# Patient Record
Sex: Female | Born: 1935 | Race: Black or African American | Hispanic: No | Smoking: Never smoker
Health system: Southern US, Community
[De-identification: ages and names within clinical notes are randomized; demographics above are authoritative.]

## PROBLEM LIST (undated history)

## (undated) ENCOUNTER — Emergency Department (HOSPITAL_COMMUNITY): Admission: EM | Payer: Self-pay

---

## 2015-12-01 DIAGNOSIS — E782 Mixed hyperlipidemia: Secondary | ICD-10-CM

## 2015-12-01 DIAGNOSIS — I10 Essential (primary) hypertension: Secondary | ICD-10-CM

## 2015-12-01 HISTORY — DX: Essential (primary) hypertension: I10

## 2015-12-01 HISTORY — DX: Mixed hyperlipidemia: E78.2

## 2016-04-16 DIAGNOSIS — K219 Gastro-esophageal reflux disease without esophagitis: Secondary | ICD-10-CM | POA: Diagnosis present

## 2021-07-19 ENCOUNTER — Encounter (HOSPITAL_COMMUNITY): Payer: Self-pay

## 2021-07-19 ENCOUNTER — Inpatient Hospital Stay (HOSPITAL_COMMUNITY)
Admission: EM | Admit: 2021-07-19 | Discharge: 2021-07-24 | DRG: 065 | Disposition: A | Discharge status: Rehabilitation Facility | Payer: Medicare Other | Attending: Internal Medicine | Admitting: Internal Medicine

## 2021-07-19 ENCOUNTER — Emergency Department (HOSPITAL_COMMUNITY): Payer: Medicare Other

## 2021-07-19 DIAGNOSIS — G8191 Hemiplegia, unspecified affecting right dominant side: Secondary | ICD-10-CM | POA: Diagnosis present

## 2021-07-19 DIAGNOSIS — K219 Gastro-esophageal reflux disease without esophagitis: Secondary | ICD-10-CM | POA: Diagnosis present

## 2021-07-19 DIAGNOSIS — I639 Cerebral infarction, unspecified: Secondary | ICD-10-CM | POA: Diagnosis not present

## 2021-07-19 DIAGNOSIS — N1832 Chronic kidney disease, stage 3b: Secondary | ICD-10-CM | POA: Diagnosis present

## 2021-07-19 DIAGNOSIS — R29706 NIHSS score 6: Secondary | ICD-10-CM | POA: Diagnosis present

## 2021-07-19 DIAGNOSIS — I10 Essential (primary) hypertension: Secondary | ICD-10-CM | POA: Diagnosis present

## 2021-07-19 DIAGNOSIS — E782 Mixed hyperlipidemia: Secondary | ICD-10-CM | POA: Diagnosis present

## 2021-07-19 DIAGNOSIS — R29810 Facial weakness: Secondary | ICD-10-CM | POA: Diagnosis present

## 2021-07-19 DIAGNOSIS — H539 Unspecified visual disturbance: Secondary | ICD-10-CM | POA: Diagnosis present

## 2021-07-19 DIAGNOSIS — I63512 Cerebral infarction due to unspecified occlusion or stenosis of left middle cerebral artery: Secondary | ICD-10-CM | POA: Diagnosis not present

## 2021-07-19 DIAGNOSIS — Z79899 Other long term (current) drug therapy: Secondary | ICD-10-CM

## 2021-07-19 DIAGNOSIS — Z6838 Body mass index (BMI) 38.0-38.9, adult: Secondary | ICD-10-CM

## 2021-07-19 DIAGNOSIS — Z888 Allergy status to other drugs, medicaments and biological substances status: Secondary | ICD-10-CM

## 2021-07-19 DIAGNOSIS — Z880 Allergy status to penicillin: Secondary | ICD-10-CM

## 2021-07-19 DIAGNOSIS — I130 Hypertensive heart and chronic kidney disease with heart failure and stage 1 through stage 4 chronic kidney disease, or unspecified chronic kidney disease: Secondary | ICD-10-CM | POA: Diagnosis present

## 2021-07-19 DIAGNOSIS — E669 Obesity, unspecified: Secondary | ICD-10-CM | POA: Diagnosis present

## 2021-07-19 DIAGNOSIS — I5032 Chronic diastolic (congestive) heart failure: Secondary | ICD-10-CM | POA: Diagnosis present

## 2021-07-19 DIAGNOSIS — Z20822 Contact with and (suspected) exposure to covid-19: Secondary | ICD-10-CM | POA: Diagnosis present

## 2021-07-19 LAB — I-STAT CHEM 8, ED
BUN: 30 mg/dL — ABNORMAL HIGH (ref 8–23)
Calcium, Ion: 1.04 mmol/L — ABNORMAL LOW (ref 1.15–1.40)
Chloride: 107 mmol/L (ref 98–111)
Creatinine, Ser: 1.6 mg/dL — ABNORMAL HIGH (ref 0.44–1.00)
Glucose, Bld: 125 mg/dL — ABNORMAL HIGH (ref 70–99)
HCT: 41 % (ref 36.0–46.0)
Hemoglobin: 13.9 g/dL (ref 12.0–15.0)
Potassium: 4.6 mmol/L (ref 3.5–5.1)
Sodium: 139 mmol/L (ref 135–145)
TCO2: 26 mmol/L (ref 22–32)

## 2021-07-19 IMAGING — CT CT HEAD CODE STROKE
4 series · 16 of 47 positions shown, 18 images · non-contrast
Comparison: None.

CLINICAL DATA: Code stroke.

EXAM:
CT HEAD WITHOUT CONTRAST
TECHNIQUE: Contiguous axial images were obtained from the base of the skull
through the vertex without intravenous contrast.

[Series 2: head wo · axial · 0.36mm/px · z∈[+1240,+1344]mm · 7 of 29 slices shown, 9 images]
[im 4/29  brain]
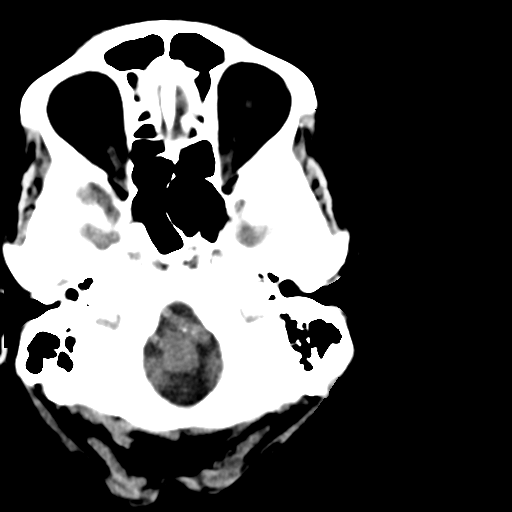
[im 4/29  bone]
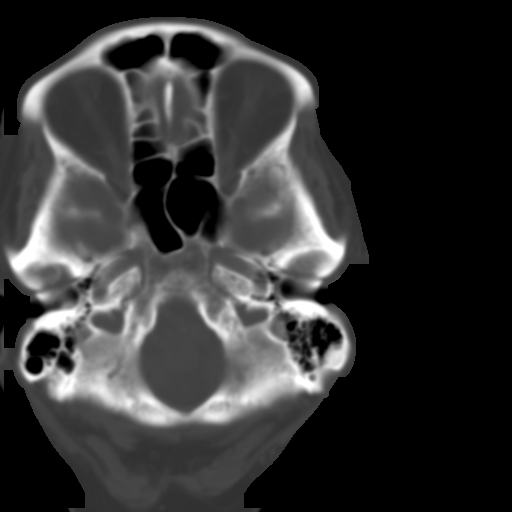
[im 8/29  brain]
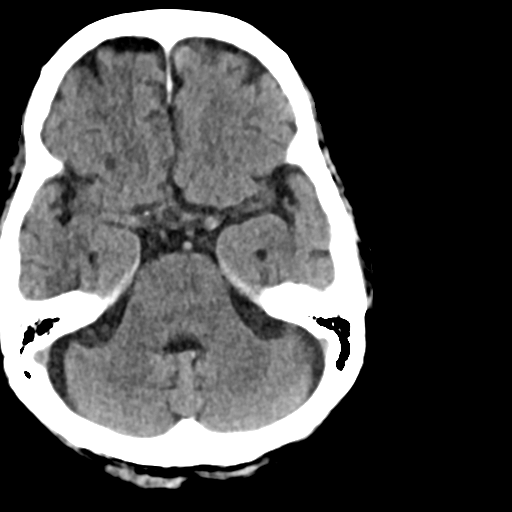
[im 11/29  brain]
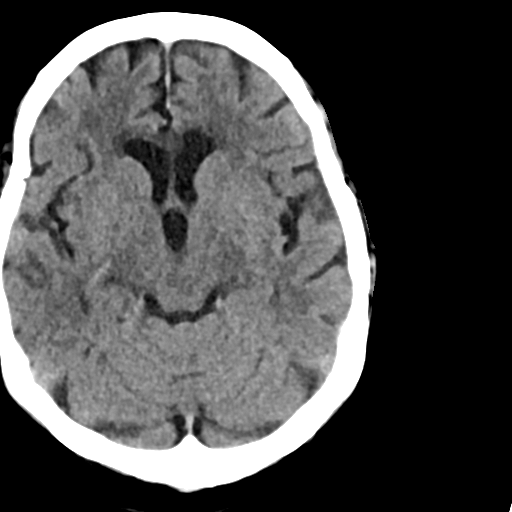
[im 15/29  brain]
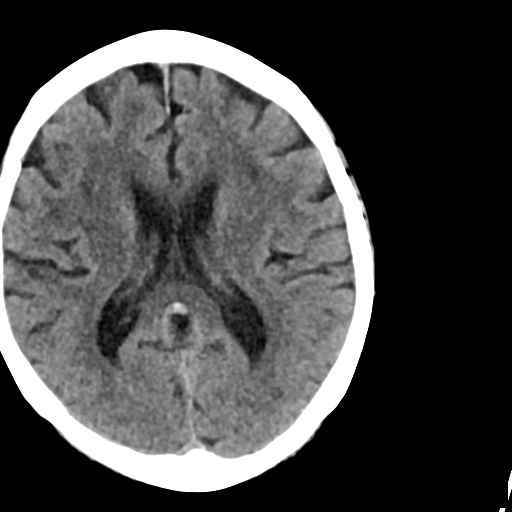
[im 18/29  brain]
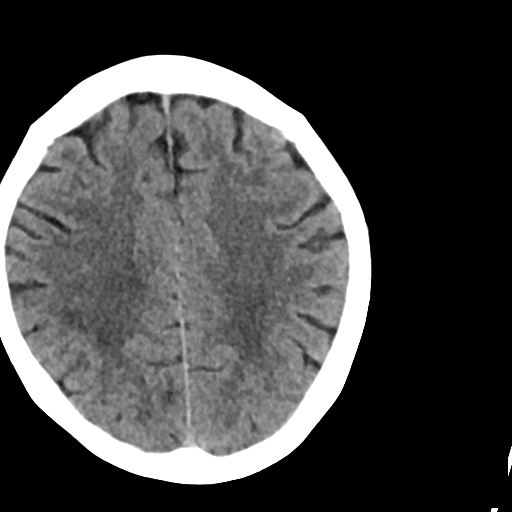
[im 18/29  bone]
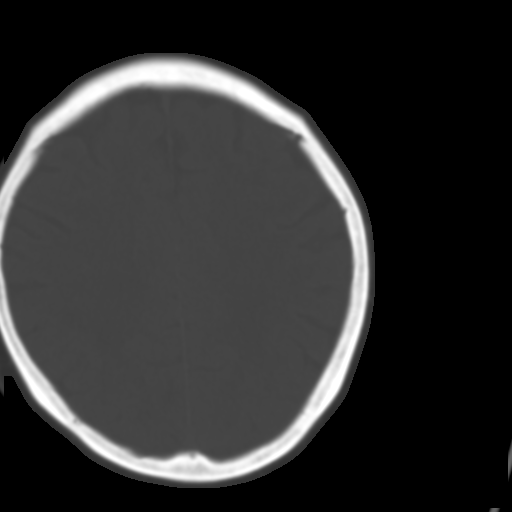
[im 22/29  brain]
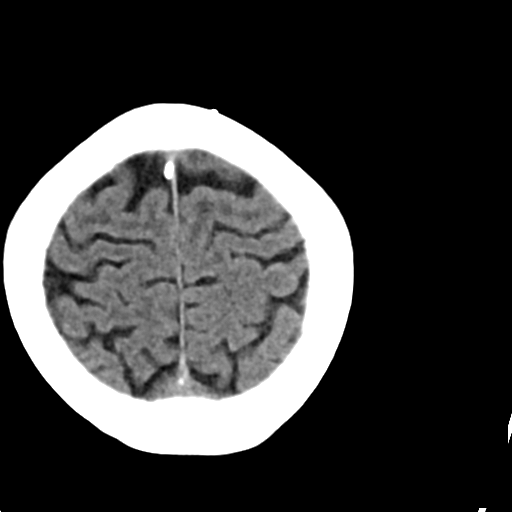
[im 25/29  brain]
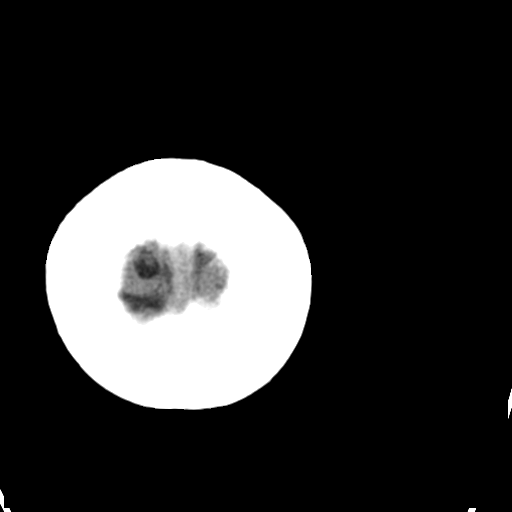

[Series 4: head bone · axial · 0.36mm/px · z∈[+1238,+1266]mm · 3 of 72 slices shown]
[im 8/72  bone]
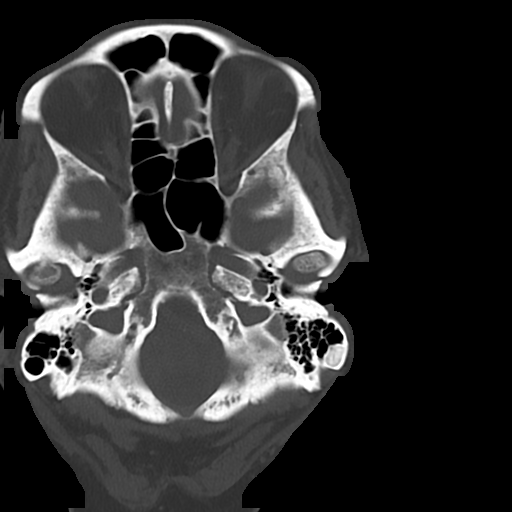
[im 15/72  bone]
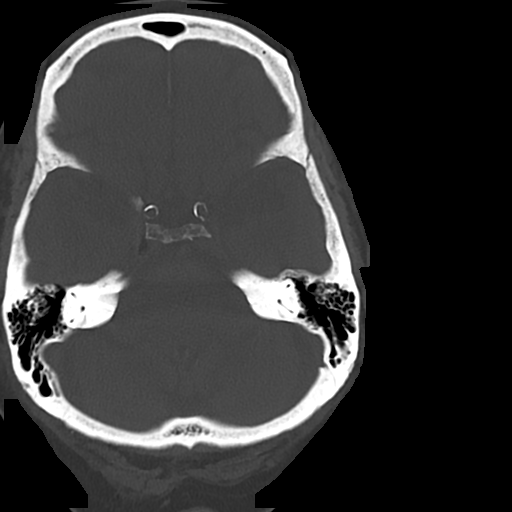
[im 22/72  bone]
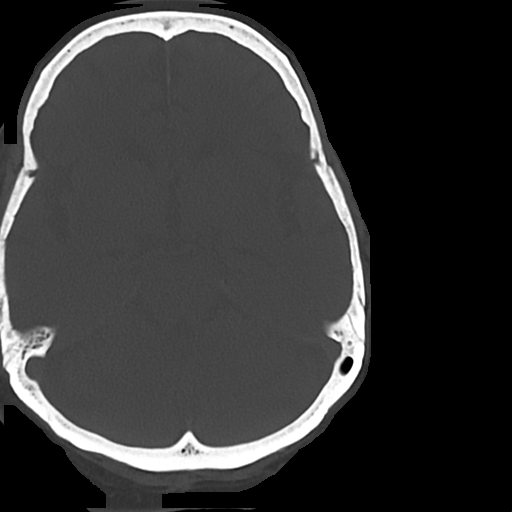

[Series 5: cor soft · coronal · 0.29mm/px · 3 of 62 slices shown]
[im 21/62  brain]
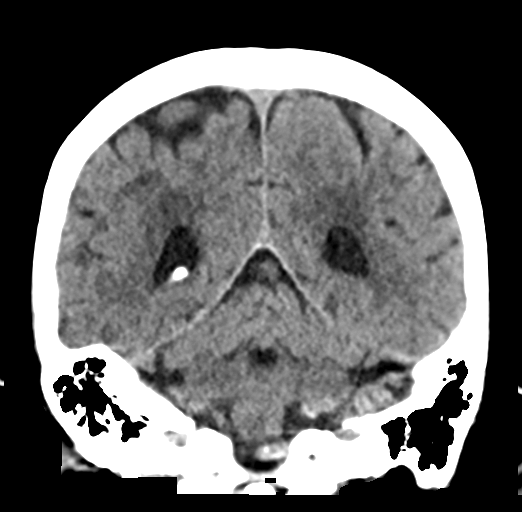
[im 28/62  brain]
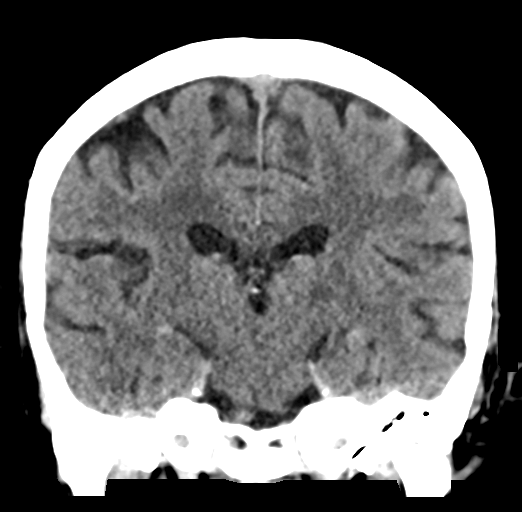
[im 34/62  brain]
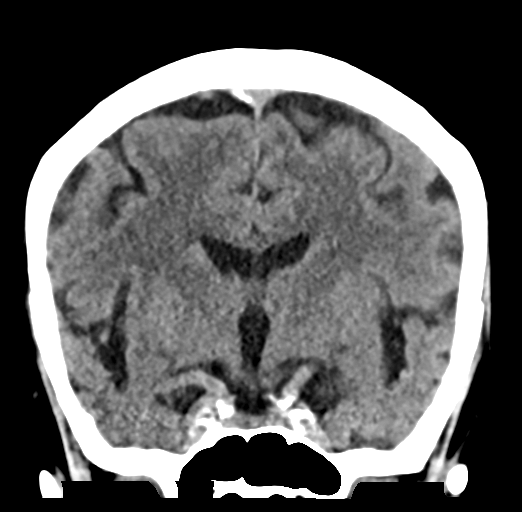

[Series 6: sag soft · sagittal · 0.29mm/px · 3 of 60 slices shown]
[im 20/60  brain]
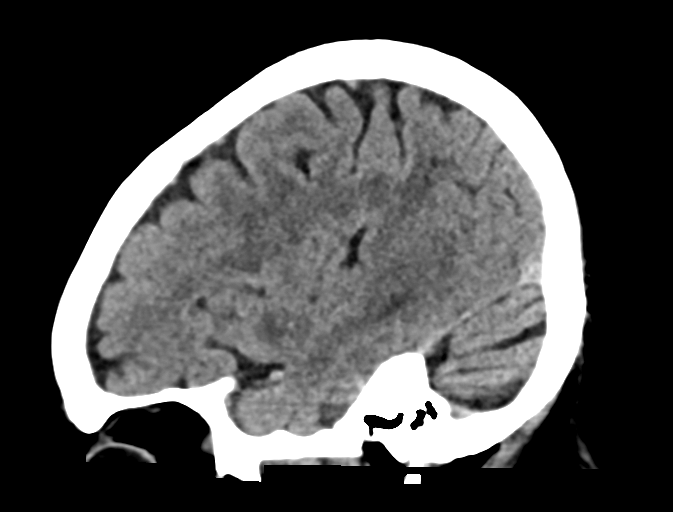
[im 30/60  brain]
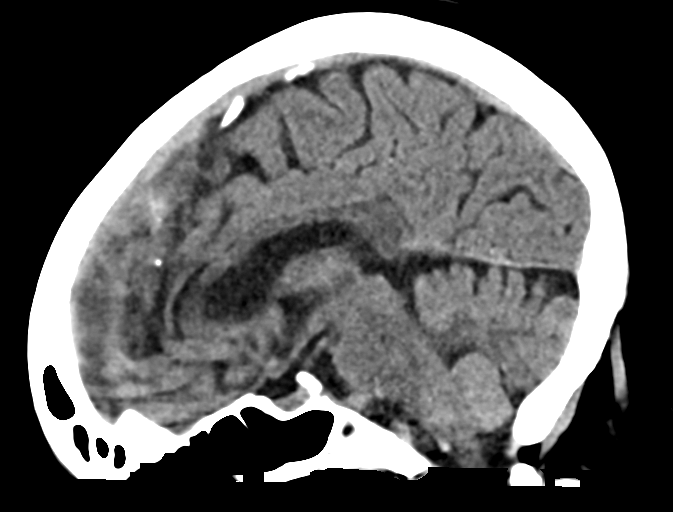
[im 40/60  brain]
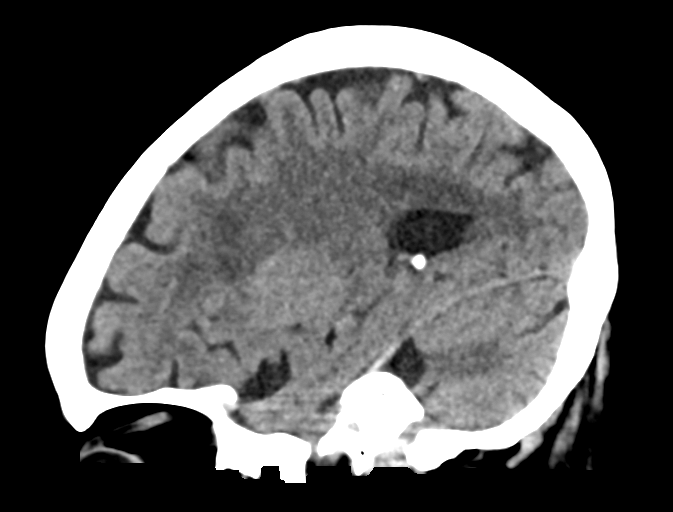

[16 of 47 positions shown; findings below may reference images not displayed]

FINDINGS: Brain: Hypodensity in the left lateral thalamus, of indeterminate
acuity, possibly acute or subacute infarct. No acute hemorrhage,
hydrocephalus, extra-axial collection, mass, mass effect, or midline
shift. Periventricular white matter changes, likely the sequela of
chronic small vessel ischemic disease.

Vascular: No hyperdense vessel.

Skull: Normal. Negative for fracture or focal lesion.

Sinuses/Orbits: No acute finding.

Other: The mastoids are well aerated.

ASPECTS ([place] Stroke Program Early CT Score)

- Ganglionic level infarction (caudate, lentiform nuclei, internal
capsule, insula, M1-M3 cortex): 7

- Supraganglionic infarction (M4-M6 cortex): 3

Total score (0-10 with 10 being normal): 9
IMPRESSION: 1. Hypodensity in the left lateral thalamus, of indeterminate
acuity, but possibly representing an acute or subacute infarct.
2. ASPECTS is 10

pm to provider DEELUN via secure text paging.

## 2021-07-19 MED ORDER — ASPIRIN 325 MG PO TABS
325.0000 mg | ORAL_TABLET | Freq: Once | ORAL | Status: AC
  Administered 2021-07-19: 325 mg via ORAL
  Filled 2021-07-19: qty 1

## 2021-07-19 NOTE — ED Triage Notes (Signed)
Pt comes via [facility], LSN 9am today, began to have R sided weakness of the arm and leg and unable to see out of the R eye, gradually got worse throughout the day.

## 2021-07-19 NOTE — ED Provider Notes (Signed)
[facility] EMERGENCY DEPARTMENT Provider Note   CSN: [id number] Arrival date & time: 07/19/21  2304  An emergency department physician performed an initial assessment on this suspected stroke patient at 2306.  History Chief Complaint  Patient presents with   Code Stroke    Diane Webb is a 87 y.o. female.  The history is provided by the patient.  Weakness Severity:  Moderate Onset quality:  Gradual Duration:  14 hours Timing:  Constant Progression:  Worsening Chronicity:  New Relieved by:  Nothing Worsened by:  Nothing Associated symptoms: difficulty walking   Associated symptoms: no abdominal pain, no chest pain, no fever, no loss of consciousness and no vomiting   Patient reports that approximately 9 AM she began having right-sided weakness in the arm and leg.  She also reports some vision difficulties.  She reports some numbness to the right side of her face.  She reports she is having difficulty walking.  It worsened throughout the day.  She does report mild headache.  No fevers or vomiting.  No chest pain or abdominal pain.     PMH-unknown Soc hx - unknown OB History   No obstetric history on file.     No family history on file.     Home Medications Prior to Admission medications   Not on File    Allergies    Patient has no known allergies.  Review of Systems   Review of Systems  Constitutional:  Negative for fever.  Eyes:  Positive for visual disturbance.  Cardiovascular:  Negative for chest pain.  Gastrointestinal:  Negative for abdominal pain and vomiting.  Neurological:  Positive for weakness and numbness. Negative for loss of consciousness.  All other systems reviewed and are negative.  Physical Exam Updated Vital Signs BP (!) 185/68   Pulse 94   Temp 98 F (36.7 C) (Oral)   Resp (!) 27   SpO2 100%   Physical Exam CONSTITUTIONAL: Elderly, no acute distress HEAD: Normocephalic/atraumatic EYES: EOMI ENMT: Mucous  membranes moist NECK: supple no meningeal signs SPINE/BACK:entire spine nontender CV: S1/S2 noted, no murmurs/rubs/gallops noted LUNGS: Lungs are clear to auscultation bilaterally, no apparent distress ABDOMEN: soft, nontender GU:no cva tenderness NEURO: Pt is awake/alert. ?Mild dysarthria.  There is right arm and right leg drift.  She reports sensory deficit to the right face. NIHSS 6 per neurology EXTREMITIES: pulses normal/equalx4, full ROM SKIN: warm, color normal PSYCH: no abnormalities of mood noted  ED Results / Procedures / Treatments   Labs (all labs ordered are listed, but only abnormal results are displayed) Labs Reviewed  I-STAT CHEM 8, ED - Abnormal; Notable for the following components:      Result Value   BUN 30 (*)    Creatinine, Ser 1.60 (*)    Glucose, Bld 125 (*)    Calcium, Ion 1.04 (*)    All other components within normal limits  RESP PANEL BY RT-PCR (FLU A&B, COVID) ARPGX2  ETHANOL  PROTIME-INR  APTT  CBC  DIFFERENTIAL  COMPREHENSIVE METABOLIC PANEL  RAPID URINE DRUG SCREEN, HOSP PERFORMED  URINALYSIS, ROUTINE W REFLEX MICROSCOPIC    EKG EKG Interpretation  Date/Time:  Wednesday July 19 2021 23:26:15 EDT Ventricular Rate:  88 PR Interval:  150 QRS Duration: 77 QT Interval:  343 QTC Calculation: 415 R Axis:   31 Text Interpretation: Sinus rhythm Nonspecific T abnormalities, inferior leads No previous ECGs available Confirmed by Zadie Rhine ([id number]) on 07/19/2021 11:40:38 PM  Radiology CT HEAD CODE STROKE  WO CONTRAST  Result Date: 07/19/2021 CLINICAL DATA:  Code stroke. EXAM: CT HEAD WITHOUT CONTRAST TECHNIQUE: Contiguous axial images were obtained from the base of the skull through the vertex without intravenous contrast. COMPARISON:  None. FINDINGS: Brain: Hypodensity in the left lateral thalamus, of indeterminate acuity, possibly acute or subacute infarct. No acute hemorrhage, hydrocephalus, extra-axial collection, mass, mass effect,  or midline shift. Periventricular white matter changes, likely the sequela of chronic small vessel ischemic disease. Vascular: No hyperdense vessel. Skull: Normal. Negative for fracture or focal lesion. Sinuses/Orbits: No acute finding. Other: The mastoids are well aerated. ASPECTS [facility] Stroke Program Early CT Score) - Ganglionic level infarction (caudate, lentiform nuclei, internal capsule, insula, M1-M3 cortex): 7 - Supraganglionic infarction (M4-M6 cortex): 3 Total score (0-10 with 10 being normal): 9 IMPRESSION: 1. Hypodensity in the left lateral thalamus, of indeterminate acuity, but possibly representing an acute or subacute infarct. 2. ASPECTS is 10 Code stroke imaging results were communicated on 07/19/2021 at 11:20 pm to provider [facility] via secure text paging. Electronically Signed   By: Wiliam Ke M.D.   On: 07/19/2021 23:21    Procedures Procedures   Medications Ordered in ED Medications  aspirin tablet 325 mg (325 mg Oral Given 07/19/21 2333)    ED Course  I have reviewed the triage vital signs and the nursing notes.  Pertinent labs & imaging results that were available during my care of the patient were reviewed by me and considered in my medical decision making (see chart for details).    MDM Rules/Calculators/A&P                           Patient presents as a code stroke.  However last known well was approximately 9 AM on October 18.  Patient has right-sided deficits.  Discussed with neurology, patient is not a candidate for acute intervention and will be admitted to the hospitalist service 12:48 AM I personally reviewed the CT head.  There is signs of a possible thalamic stroke I Have updated the patient.  Daughter is at bedside who also gives history.  Reported the patient has a history of hypertension is on losartan.  No previous history of stroke.  She also has a history of chronic kidney disease. Discussed with Dr. Leafy Half for admission to the hospitalist  service Final Clinical Impression(s) / ED Diagnoses Final diagnoses:  Cerebrovascular accident (CVA), unspecified mechanism [facility])    Rx / DC Orders ED Discharge Orders     None        Zadie Rhine, MD 07/20/21 [phone]

## 2021-07-19 NOTE — Consult Note (Signed)
Neurology consult H&P  Diane Webb MR# [id number] 07/19/2021  CC: code stroke  History is obtained from: patient, EMS and chart.  HPI: Diane Webb is a 87 y.o. female PMHx as reviewed below developed right sided weakness ~09:00 this morning. She later noticed that she had numbness of the right side of her face and throughout the day it became a little more difficult to walk.  She not had these symptoms in the past.  She reports no fall/head trauma/loc, fevers or vomiting, chest pain or abdominal pain.   LKW: 0900 tNK given: No OSW IR Thrombectomy No Modified Rankin Scale: 0-Completely asymptomatic and back to baseline post- stroke NIHSS: 6 - Her exam fluctuated and was inconsistent.  ROS: A complete ROS was performed and is negative except as noted in the HPI.   History reviewed. No pertinent past medical history.   No family history on file.  Social History:  has no history on file for tobacco use, alcohol use, and drug use.   Prior to Admission medications   Not on File   Exam: Current vital signs: BP (!) 185/90   Pulse 89   Temp 98 F (36.7 C) (Oral)   Resp 18   SpO2 98%   Physical Exam  Constitutional: Appears well-developed and well-nourished.  Psych: Affect appropriate to situation Eyes: No scleral injection HENT: No OP obstruction. Head: Normocephalic.  Cardiovascular: Normal rate and regular rhythm.  Respiratory: Effort normal, symmetric excursions bilaterally, no audible wheezing. GI: Soft.  No distension. There is no tenderness.  Skin: WDI  Neuro: Mental Status: Patient is awake, alert, oriented to person, place, month, year, and situation. Patient is able to give a clear and coherent history. Speech fluent, intact comprehension and repetition. No signs of aphasia or neglect. Visual Fields are full. Pupils are equal, round, and reactive to light. EOMI without ptosis or diploplia.  Facial sensation is decreased to cold on the right. Facial  movement is symmetric.  Hearing is intact to voice. Uvula midline and palate elevates symmetrically. Shoulder shrug is symmetric. Tongue is midline without atrophy or fasciculations.  Tone is normal. Bulk is normal. Right lower extremity ~4/5 Sensation:  Decreased to cold on left upper extremity. Decreased to cold on right lower extremity.  Deep Tendon Reflexes: 2+ and symmetric in the biceps and patellae. Toes are mute on right. FNF and HKS are intact bilaterally. Gait - Deferred  I have reviewed labs in epic and the pertinent results are: None available at time of evaluation  I have reviewed the images obtained: NCT head showed decreased attenuation in the area of the left lateral thalamus which is age indeterminate.  Assessment: Diane Webb is a 87 y.o. right handed female PMHx as noted above with right sided weakness and sensory deficits which began 09:00. She arrives outside the window for tNK however, her acute symptoms may correlate with area of hypodensity in thalamus and she will need further acute stroke evaluation.  Recommended aspirin 324mg  now.   Plan: - MRI brain without contrast. - Recommend vascular imaging with MRA head and neck. - Recommend TTE. - Recommend labs: HbA1c, lipid panel, TSH. - Recommend Statin if LDL > 70 - Aspirin 81mg  daily. - Clopidogrel 75mg  daily for 3 weeks. - Telemetry monitoring for arrhythmia. - Recommend bedside Swallow screen. - Recommend Stroke education. - Recommend PT/OT/SLP consult.  This patient is critically ill and at significant risk of neurological worsening, death and care requires constant monitoring of vital signs, hemodynamics,respiratory and cardiac monitoring,  neurological assessment, discussion with family, other specialists and medical decision making of high complexity. I spent 73 minutes of neurocritical care time  in the care of  this patient. This was time spent independent of any time provided by nurse  practitioner or PA.  Electronically signed by:  Marisue Humble, MD Page: [phone] 07/19/2021, 11:57 PM

## 2021-07-19 NOTE — Code Documentation (Signed)
Responded to Code Stroke called at 2241 for R sided weakness/numbness, [id number]. Pt arrived at 2304, CBG-125, NIH-6(though inconsistent), CTH-negative for acute changes. Plan ASA and MRI.

## 2021-07-20 ENCOUNTER — Other Ambulatory Visit (HOSPITAL_COMMUNITY): Payer: Self-pay

## 2021-07-20 ENCOUNTER — Inpatient Hospital Stay (HOSPITAL_COMMUNITY): Payer: Medicare Other

## 2021-07-20 ENCOUNTER — Encounter (HOSPITAL_COMMUNITY): Payer: Self-pay | Admitting: Internal Medicine

## 2021-07-20 DIAGNOSIS — Z880 Allergy status to penicillin: Secondary | ICD-10-CM | POA: Diagnosis not present

## 2021-07-20 DIAGNOSIS — K219 Gastro-esophageal reflux disease without esophagitis: Secondary | ICD-10-CM

## 2021-07-20 DIAGNOSIS — I69351 Hemiplegia and hemiparesis following cerebral infarction affecting right dominant side: Secondary | ICD-10-CM | POA: Diagnosis present

## 2021-07-20 DIAGNOSIS — I63511 Cerebral infarction due to unspecified occlusion or stenosis of right middle cerebral artery: Secondary | ICD-10-CM

## 2021-07-20 DIAGNOSIS — I10 Essential (primary) hypertension: Secondary | ICD-10-CM

## 2021-07-20 DIAGNOSIS — R531 Weakness: Secondary | ICD-10-CM | POA: Diagnosis not present

## 2021-07-20 DIAGNOSIS — I639 Cerebral infarction, unspecified: Secondary | ICD-10-CM | POA: Diagnosis present

## 2021-07-20 DIAGNOSIS — Z20822 Contact with and (suspected) exposure to covid-19: Secondary | ICD-10-CM | POA: Diagnosis present

## 2021-07-20 DIAGNOSIS — M79604 Pain in right leg: Secondary | ICD-10-CM | POA: Diagnosis present

## 2021-07-20 DIAGNOSIS — R4701 Aphasia: Secondary | ICD-10-CM | POA: Diagnosis not present

## 2021-07-20 DIAGNOSIS — R200 Anesthesia of skin: Secondary | ICD-10-CM

## 2021-07-20 DIAGNOSIS — I63332 Cerebral infarction due to thrombosis of left posterior cerebral artery: Secondary | ICD-10-CM | POA: Diagnosis not present

## 2021-07-20 DIAGNOSIS — I959 Hypotension, unspecified: Secondary | ICD-10-CM | POA: Diagnosis not present

## 2021-07-20 DIAGNOSIS — G8191 Hemiplegia, unspecified affecting right dominant side: Secondary | ICD-10-CM | POA: Diagnosis present

## 2021-07-20 DIAGNOSIS — Z6835 Body mass index (BMI) 35.0-35.9, adult: Secondary | ICD-10-CM | POA: Diagnosis not present

## 2021-07-20 DIAGNOSIS — N1832 Chronic kidney disease, stage 3b: Secondary | ICD-10-CM | POA: Diagnosis present

## 2021-07-20 DIAGNOSIS — I63512 Cerebral infarction due to unspecified occlusion or stenosis of left middle cerebral artery: Principal | ICD-10-CM

## 2021-07-20 DIAGNOSIS — I69393 Ataxia following cerebral infarction: Secondary | ICD-10-CM | POA: Diagnosis not present

## 2021-07-20 DIAGNOSIS — Z6838 Body mass index (BMI) 38.0-38.9, adult: Secondary | ICD-10-CM | POA: Diagnosis not present

## 2021-07-20 DIAGNOSIS — E669 Obesity, unspecified: Secondary | ICD-10-CM | POA: Diagnosis present

## 2021-07-20 DIAGNOSIS — E782 Mixed hyperlipidemia: Secondary | ICD-10-CM

## 2021-07-20 DIAGNOSIS — K5900 Constipation, unspecified: Secondary | ICD-10-CM | POA: Diagnosis present

## 2021-07-20 DIAGNOSIS — R29810 Facial weakness: Secondary | ICD-10-CM | POA: Diagnosis present

## 2021-07-20 DIAGNOSIS — Z79899 Other long term (current) drug therapy: Secondary | ICD-10-CM | POA: Diagnosis not present

## 2021-07-20 DIAGNOSIS — I130 Hypertensive heart and chronic kidney disease with heart failure and stage 1 through stage 4 chronic kidney disease, or unspecified chronic kidney disease: Secondary | ICD-10-CM | POA: Diagnosis present

## 2021-07-20 DIAGNOSIS — H539 Unspecified visual disturbance: Secondary | ICD-10-CM | POA: Diagnosis present

## 2021-07-20 DIAGNOSIS — Z888 Allergy status to other drugs, medicaments and biological substances status: Secondary | ICD-10-CM | POA: Diagnosis not present

## 2021-07-20 DIAGNOSIS — I5032 Chronic diastolic (congestive) heart failure: Secondary | ICD-10-CM | POA: Diagnosis present

## 2021-07-20 DIAGNOSIS — G4700 Insomnia, unspecified: Secondary | ICD-10-CM | POA: Diagnosis present

## 2021-07-20 DIAGNOSIS — R29706 NIHSS score 6: Secondary | ICD-10-CM | POA: Diagnosis present

## 2021-07-20 DIAGNOSIS — I129 Hypertensive chronic kidney disease with stage 1 through stage 4 chronic kidney disease, or unspecified chronic kidney disease: Secondary | ICD-10-CM | POA: Diagnosis present

## 2021-07-20 HISTORY — DX: Chronic kidney disease, stage 3b: N18.32

## 2021-07-20 LAB — COMPREHENSIVE METABOLIC PANEL
ALT: 15 U/L (ref 0–44)
ALT: 17 U/L (ref 0–44)
AST: 19 U/L (ref 15–41)
AST: 20 U/L (ref 15–41)
Albumin: 3.5 g/dL (ref 3.5–5.0)
Albumin: 3.9 g/dL (ref 3.5–5.0)
Alkaline Phosphatase: 56 U/L (ref 38–126)
Alkaline Phosphatase: 58 U/L (ref 38–126)
Anion gap: 10 (ref 5–15)
Anion gap: 12 (ref 5–15)
BUN: 24 mg/dL — ABNORMAL HIGH (ref 8–23)
BUN: 23 mg/dL (ref 8–23)
CO2: 24 mmol/L (ref 22–32)
CO2: 25 mmol/L (ref 22–32)
Calcium: 9.4 mg/dL (ref 8.9–10.3)
Calcium: 9.5 mg/dL (ref 8.9–10.3)
Chloride: 104 mmol/L (ref 98–111)
Chloride: 102 mmol/L (ref 98–111)
Creatinine, Ser: 1.52 mg/dL — ABNORMAL HIGH (ref 0.44–1.00)
Creatinine, Ser: 1.34 mg/dL — ABNORMAL HIGH (ref 0.44–1.00)
GFR, Estimated: 32 mL/min — ABNORMAL LOW (ref >60)
GFR, Estimated: 37 mL/min — ABNORMAL LOW (ref >60)
Glucose, Bld: 104 mg/dL — ABNORMAL HIGH (ref 70–99)
Glucose, Bld: 109 mg/dL — ABNORMAL HIGH (ref 70–99)
Potassium: 4.5 mmol/L (ref 3.5–5.1)
Potassium: 4.2 mmol/L (ref 3.5–5.1)
Sodium: 138 mmol/L (ref 135–145)
Sodium: 139 mmol/L (ref 135–145)
Total Bilirubin: 0.5 mg/dL (ref 0.3–1.2)
Total Bilirubin: 0.7 mg/dL (ref 0.3–1.2)
Total Protein: 6.5 g/dL (ref 6.5–8.1)
Total Protein: 7.1 g/dL (ref 6.5–8.1)

## 2021-07-20 LAB — URINALYSIS, ROUTINE W REFLEX MICROSCOPIC
Bilirubin Urine: NEGATIVE
Glucose, UA: NEGATIVE mg/dL
Hgb urine dipstick: NEGATIVE
Ketones, ur: NEGATIVE mg/dL
Nitrite: NEGATIVE
Protein, ur: NEGATIVE mg/dL
Specific Gravity, Urine: 1.016 (ref 1.005–1.030)
pH: 6 (ref 5.0–8.0)

## 2021-07-20 LAB — CBC WITH DIFFERENTIAL/PLATELET
Abs Immature Granulocytes: 0.07 10*3/uL (ref 0.00–0.07)
Basophils Absolute: 0 10*3/uL (ref 0.0–0.1)
Basophils Relative: 0 %
Eosinophils Absolute: 0.3 10*3/uL (ref 0.0–0.5)
Eosinophils Relative: 3 %
HCT: 42 % (ref 36.0–46.0)
Hemoglobin: 13.4 g/dL (ref 12.0–15.0)
Immature Granulocytes: 1 %
Lymphocytes Relative: 31 %
Lymphs Abs: 2.9 10*3/uL (ref 0.7–4.0)
MCH: 29.1 pg (ref 26.0–34.0)
MCHC: 31.9 g/dL (ref 30.0–36.0)
MCV: 91.1 fL (ref 80.0–100.0)
Monocytes Absolute: 0.6 10*3/uL (ref 0.1–1.0)
Monocytes Relative: 6 %
Neutro Abs: 5.5 10*3/uL (ref 1.7–7.7)
Neutrophils Relative %: 59 %
Platelets: 229 10*3/uL (ref 150–400)
RBC: 4.61 MIL/uL (ref 3.87–5.11)
RDW: 12.7 % (ref 11.5–15.5)
WBC: 9.4 10*3/uL (ref 4.0–10.5)
nRBC: 0 % (ref 0.0–0.2)

## 2021-07-20 LAB — RAPID URINE DRUG SCREEN, HOSP PERFORMED
Amphetamines: NOT DETECTED
Barbiturates: NOT DETECTED
Benzodiazepines: NOT DETECTED
Cocaine: NOT DETECTED
Opiates: NOT DETECTED
Tetrahydrocannabinol: NOT DETECTED

## 2021-07-20 LAB — MAGNESIUM: Magnesium: 2 mg/dL (ref 1.7–2.4)

## 2021-07-20 LAB — RESP PANEL BY RT-PCR (FLU A&B, COVID) ARPGX2
Influenza A by PCR: NEGATIVE
Influenza B by PCR: NEGATIVE
SARS Coronavirus 2 by RT PCR: NEGATIVE

## 2021-07-20 LAB — CBC
HCT: 39.2 % (ref 36.0–46.0)
Hemoglobin: 12.7 g/dL (ref 12.0–15.0)
MCH: 29.4 pg (ref 26.0–34.0)
MCHC: 32.4 g/dL (ref 30.0–36.0)
MCV: 90.7 fL (ref 80.0–100.0)
Platelets: 207 10*3/uL (ref 150–400)
RBC: 4.32 MIL/uL (ref 3.87–5.11)
RDW: 12.6 % (ref 11.5–15.5)
WBC: 9.5 10*3/uL (ref 4.0–10.5)
nRBC: 0 % (ref 0.0–0.2)

## 2021-07-20 LAB — LIPID PANEL
Cholesterol: 293 mg/dL — ABNORMAL HIGH (ref 0–200)
HDL: 59 mg/dL (ref >40)
LDL Cholesterol: 198 mg/dL — ABNORMAL HIGH (ref 0–99)
Total CHOL/HDL Ratio: 5 RATIO
Triglycerides: 181 mg/dL — ABNORMAL HIGH (ref <150)
VLDL: 36 mg/dL (ref 0–40)

## 2021-07-20 LAB — PROTIME-INR
INR: 1.1 (ref 0.8–1.2)
Prothrombin Time: 14.6 seconds (ref 11.4–15.2)

## 2021-07-20 LAB — DIFFERENTIAL
Abs Immature Granulocytes: 0.05 10*3/uL (ref 0.00–0.07)
Basophils Absolute: 0 10*3/uL (ref 0.0–0.1)
Basophils Relative: 0 %
Eosinophils Absolute: 0.2 10*3/uL (ref 0.0–0.5)
Eosinophils Relative: 2 %
Immature Granulocytes: 1 %
Lymphocytes Relative: 24 %
Lymphs Abs: 2.3 10*3/uL (ref 0.7–4.0)
Monocytes Absolute: 0.7 10*3/uL (ref 0.1–1.0)
Monocytes Relative: 7 %
Neutro Abs: 6.2 10*3/uL (ref 1.7–7.7)
Neutrophils Relative %: 66 %

## 2021-07-20 LAB — HEMOGLOBIN A1C
Hgb A1c MFr Bld: 5.1 % (ref 4.8–5.6)
Mean Plasma Glucose: 99.67 mg/dL

## 2021-07-20 LAB — ECHOCARDIOGRAM COMPLETE
Area-P 1/2: 3.5 cm2
S' Lateral: 2.6 cm
Single Plane A4C EF: 56.8 %

## 2021-07-20 LAB — APTT: aPTT: 31 seconds (ref 24–36)

## 2021-07-20 LAB — ETHANOL: Alcohol, Ethyl (B): <10 mg/dL (ref <10)

## 2021-07-20 IMAGING — MR MR HEAD W/O CM
12 of 13 series · 44 of 48 positions shown · non-contrast
Comparison: No prior MRI, correlation is made with CT head
[DATE].

CLINICAL DATA: Neuro deficit, stroke suspected

EXAM:
MRI HEAD WITHOUT CONTRAST
TECHNIQUE: Multiplanar, multiecho pulse sequences of the brain and surrounding
structures were obtained without intravenous contrast.

[Series 5: DWI · axial · 3.0mm · 0.88mm/px · z∈[-75,+71]mm · 7 of 100 slices shown (1 of 4)]
[im 1/100]
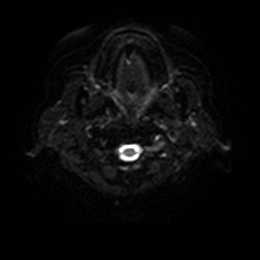
[im 17/100]
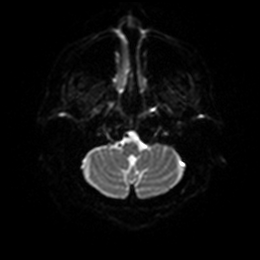
[im 34/100]
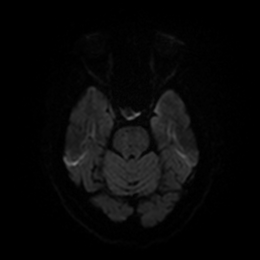
[im 50/100]
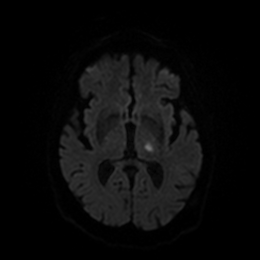
[im 67/100]
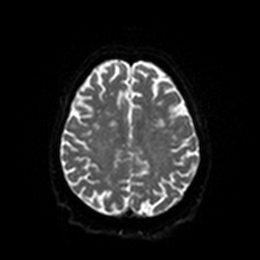
[im 83/100]
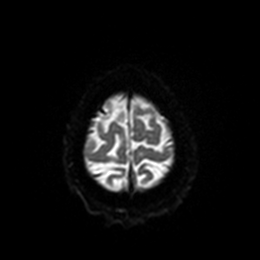
[im 100/100]
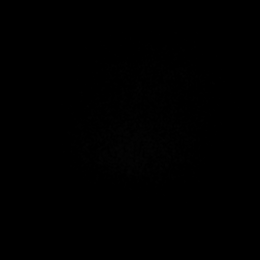

[Series 6: DWI · axial · 3.0mm · 0.88mm/px · z∈[-75,+68]mm · 4 of 49 slices shown (2 of 4)]
[im 1/49]
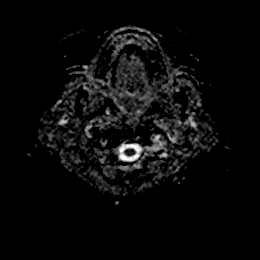
[im 17/49]
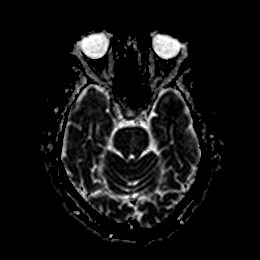
[im 33/49]
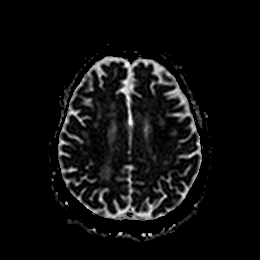
[im 49/49]
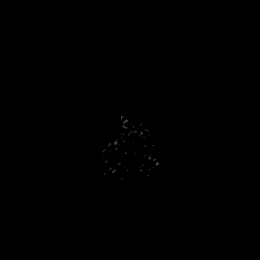

[Series 7: DWI · coronal · 4.0mm · 0.88mm/px · 6 of 76 slices shown (3 of 4)]
[im 1/76]
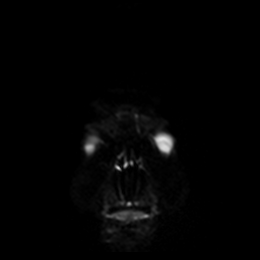
[im 16/76]
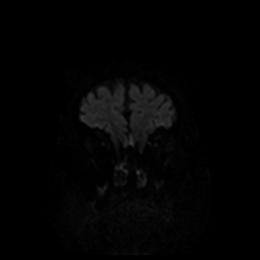
[im 31/76]
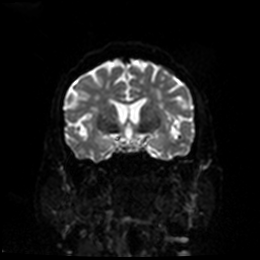
[im 46/76]
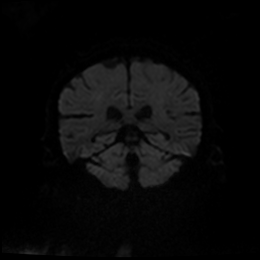
[im 61/76]
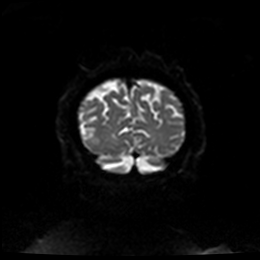
[im 76/76]
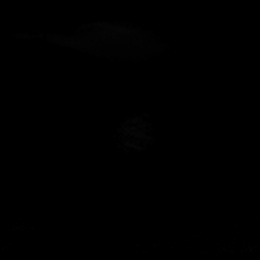

[Series 8: DWI · coronal · 4.0mm · 0.88mm/px · 3 of 38 slices shown (4 of 4)]
[im 1/38]
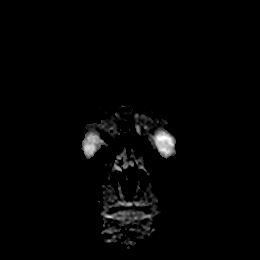
[im 19/38]
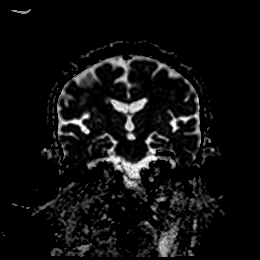
[im 38/38]
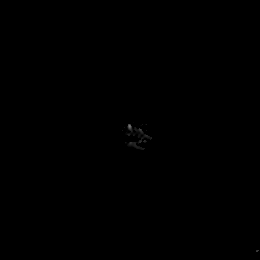

[Series 9: T1 · sagittal · 5.0mm · 0.75mm/px · 2 of 27 slices shown]
[im 1/27]
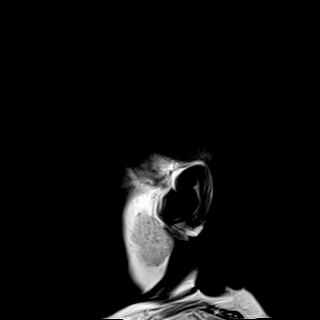
[im 27/27]
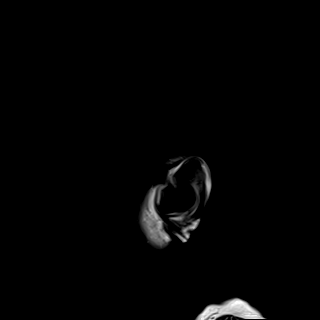

[Series 10: T2 · axial · 5.0mm · 0.72mm/px · z∈[-84,+72]mm · 2 of 27 slices shown (1 of 2)]
[im 1/27]
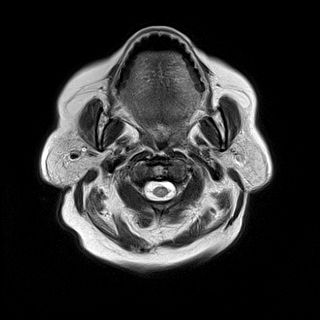
[im 27/27]
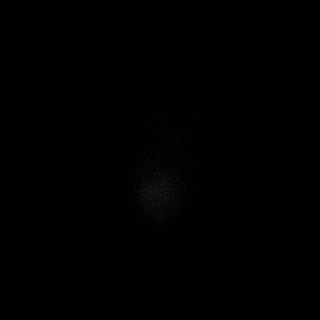

[Series 11: FLAIR · axial · 5.0mm · 0.45mm/px · z∈[-83,+73]mm · 2 of 27 slices shown]
[im 1/27]
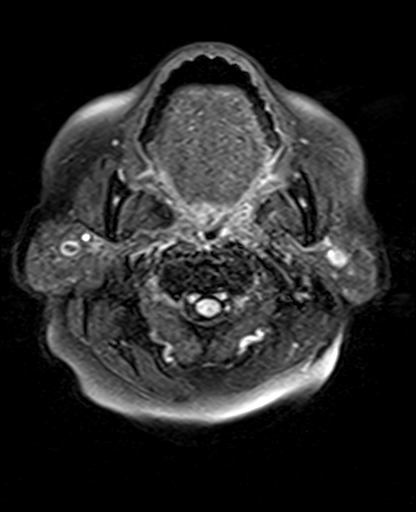
[im 27/27]
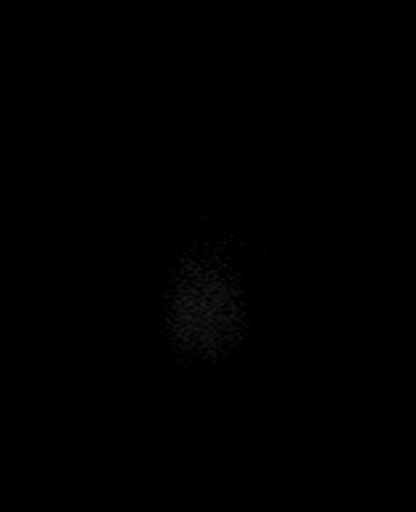

[Series 12: mag_images · axial · 3.0mm · 0.90mm/px · z∈[-78,+75]mm · 4 of 52 slices shown]
[im 1/52]
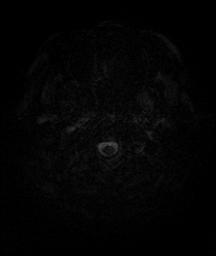
[im 18/52]
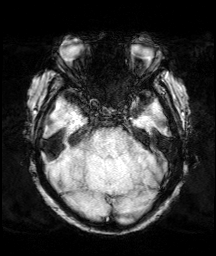
[im 35/52]
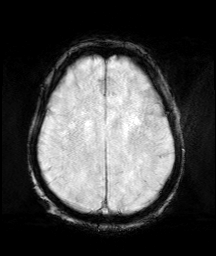
[im 52/52]
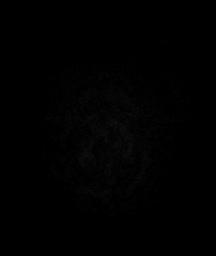

[Series 13: pha_images · axial · 3.0mm · 0.90mm/px · z∈[-78,+72]mm · 4 of 51 slices shown]
[im 1/51]
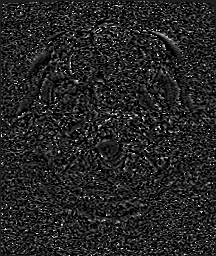
[im 17/51]
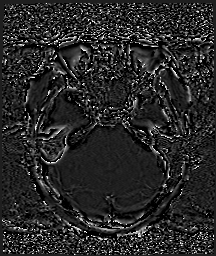
[im 34/51]
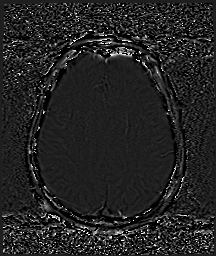
[im 51/51]
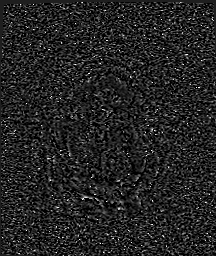

[Series 14: swi_images · axial · 3.0mm · 0.90mm/px · z∈[-78,+75]mm · 4 of 52 slices shown]
[im 1/52]
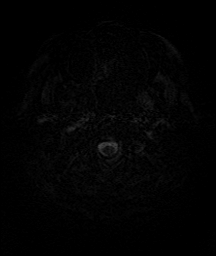
[im 18/52]
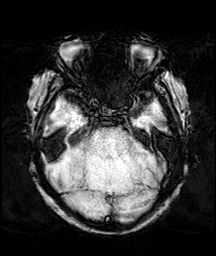
[im 35/52]
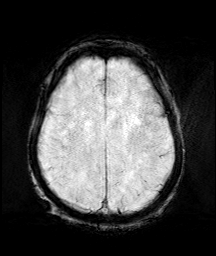
[im 52/52]
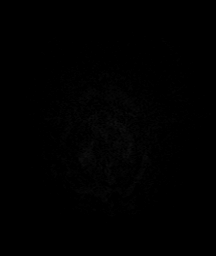

[Series 15: mip_images(sw) · axial · 24.0mm · 0.90mm/px · z∈[-67,+65]mm · 3 of 45 slices shown]
[im 1/45]
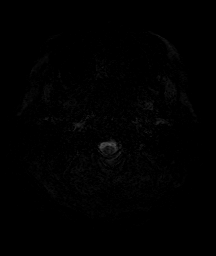
[im 23/45]
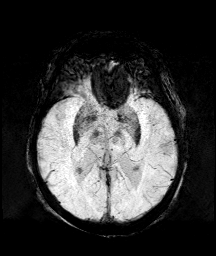
[im 45/45]
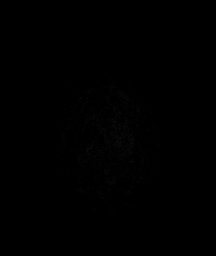

[Series 17: T2 · coronal · 5.0mm · 0.34mm/px · 3 of 33 slices shown (2 of 2)]
[im 1/33]
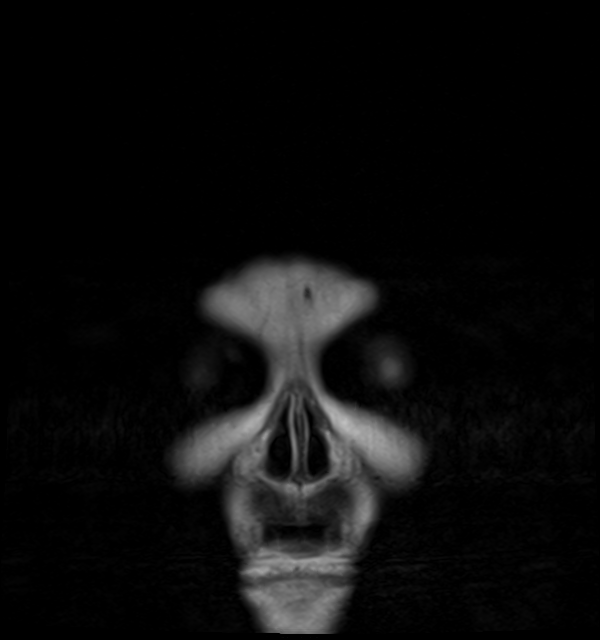
[im 17/33]
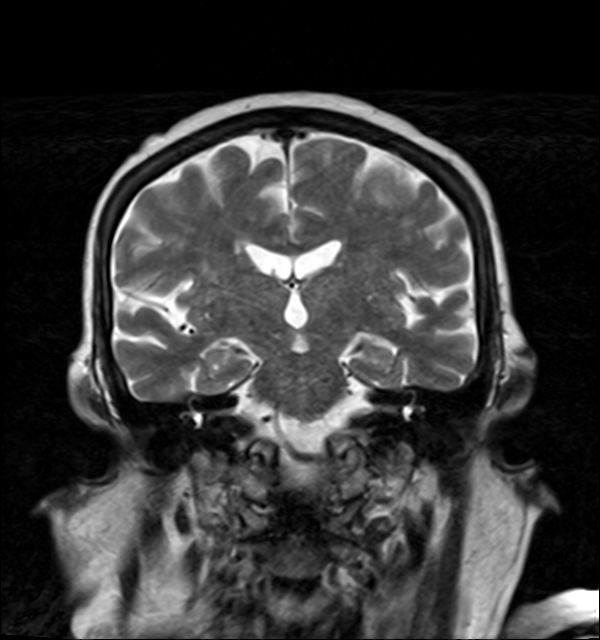
[im 33/33]
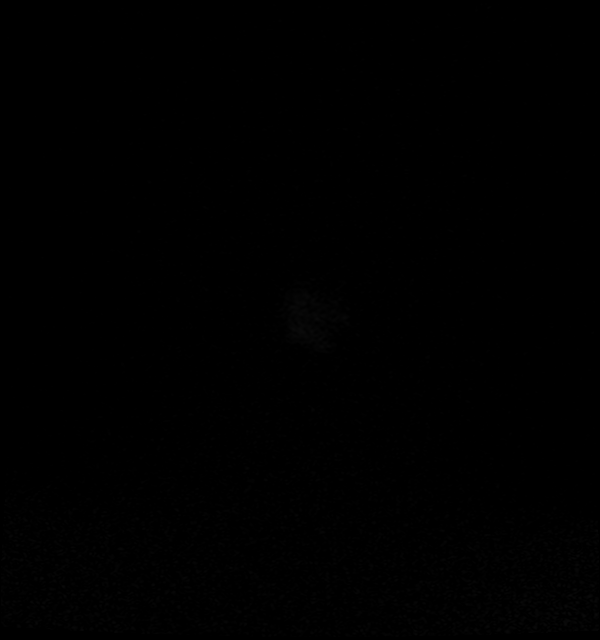

[44 of 48 positions shown; findings below may reference images not displayed]

FINDINGS: Brain: Restricted diffusion in the left lateral thalamus, which
correlates with the hypodensity seen on the [DATE] CT head. No
other foci of restricted diffusion. No acute hemorrhage. Nose
hemosiderin deposition to suggest remote hemorrhage. No
hydrocephalus, extra-axial collection, mass, mass effect, or midline
shift. Confluent T2 hyperintense signal in the periventricular white
matter and pons, likely the sequela of severe chronic small vessel
ischemic disease.

Vascular: Normal flow voids.

Skull and upper cervical spine: Normal marrow signal.

Sinuses/Orbits: Negative.  Status post bilateral lens replacements.

Other: The mastoids are well aerated.
IMPRESSION: Acute infarct of the left lateral thalamus, which correlates with
the hypodensity seen on the same-day CT head.

Imaging results were communicated on [DATE] at [DATE] to
provider [facility] via secure text paging.

## 2021-07-20 IMAGING — MR MR MRA HEAD W/O CM
1 series · 19 of 48 positions shown · non-contrast
Comparison: Brain MRI [5B] hours today. Neck MRA today reported
separately.

CLINICAL DATA: [AGE] female with neurologic deficit. Acute
left thalamic lacunar infarct on MRI.

EXAM:
MRA HEAD WITHOUT CONTRAST
TECHNIQUE: Angiographic images of the Circle of Willis were acquired using MRA
technique without intravenous contrast.

[Series 5: 3d cow · axial · 0.5mm · 0.41mm/px · z∈[-72,+17]mm · 19 of 188 slices shown]
[im 1/188]
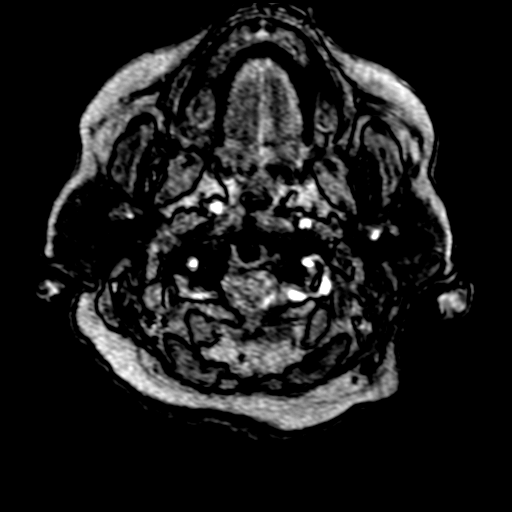
[im 4/188]
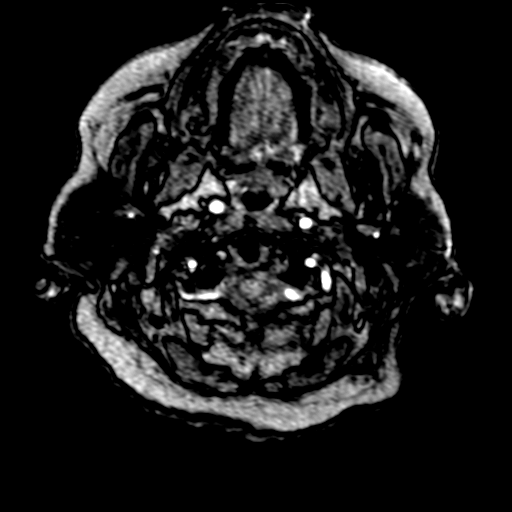
[im 8/188]
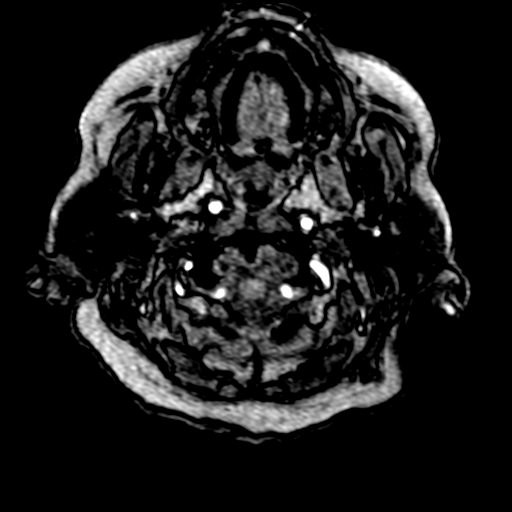
[im 12/188]
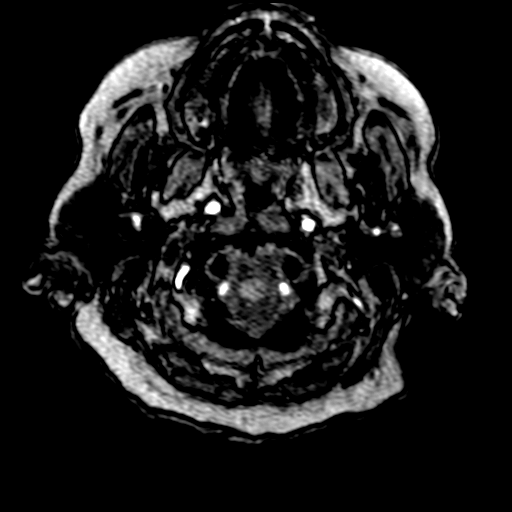
[im 16/188]
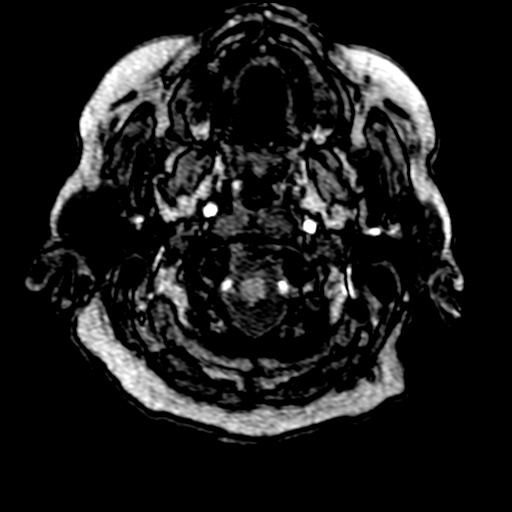
[im 20/188]
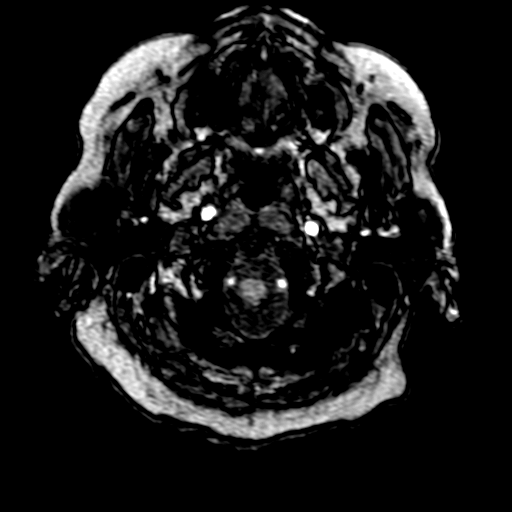
[im 24/188]
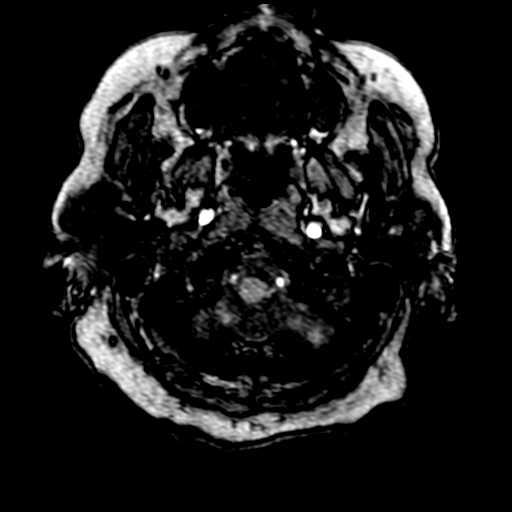
[im 28/188]
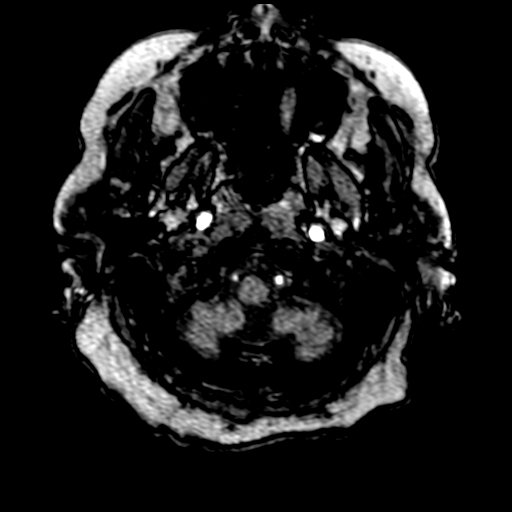
[im 32/188]
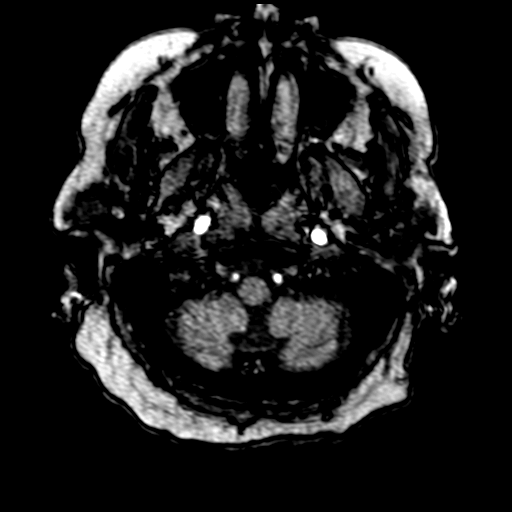
[im 36/188]
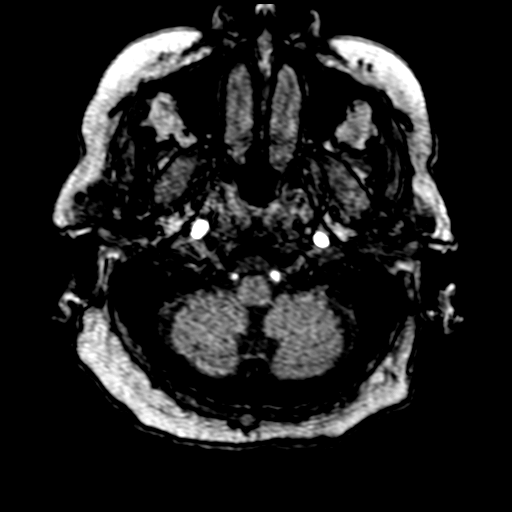
[im 40/188]
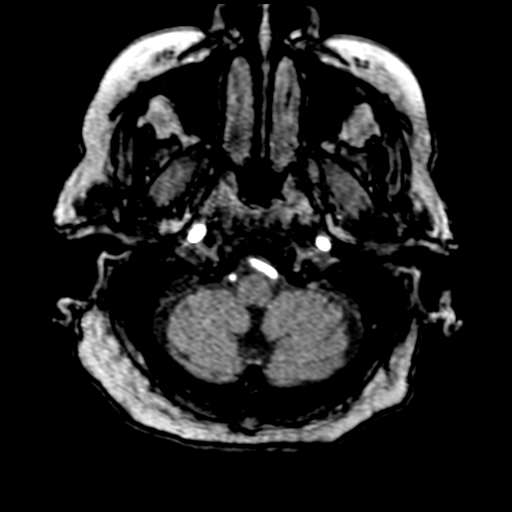
[im 60/188]
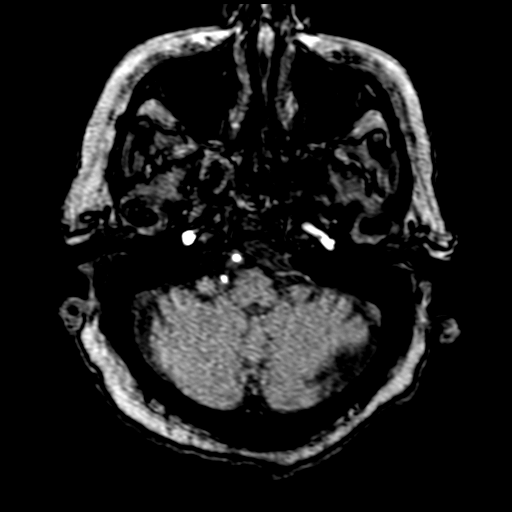
[im 84/188]
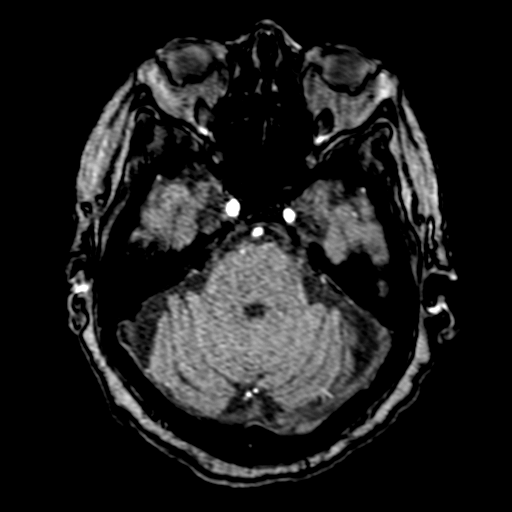
[im 96/188]
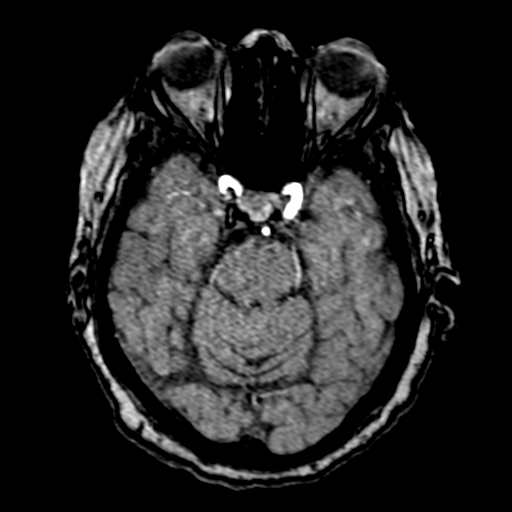
[im 108/188]
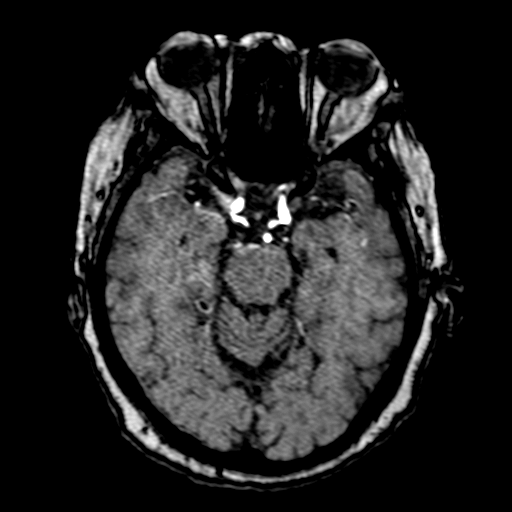
[im 132/188]
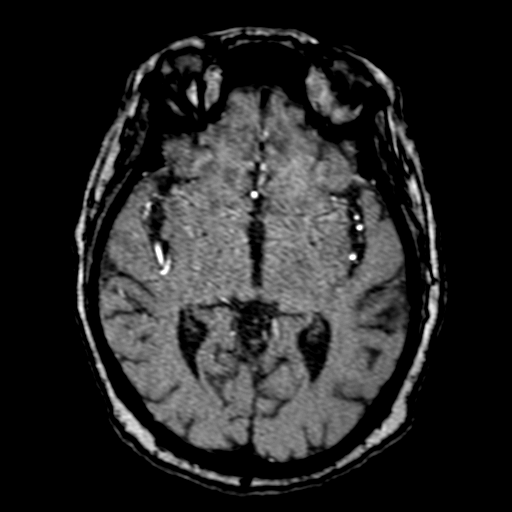
[im 156/188]
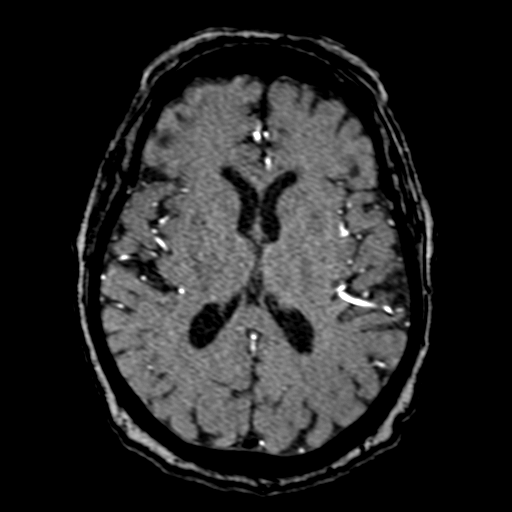
[im 160/188]
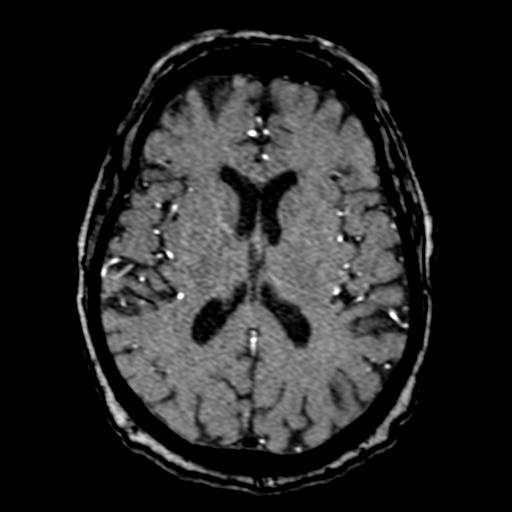
[im 180/188]
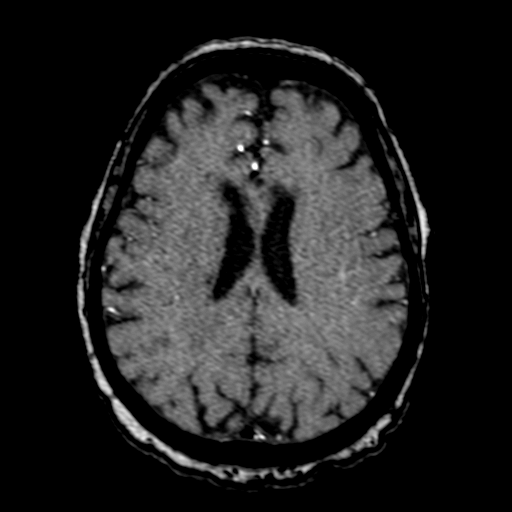

[19 of 48 positions shown; findings below may reference images not displayed]

FINDINGS: No intracranial mass effect or ventriculomegaly.

Antegrade flow in the posterior circulation with patent distal
vertebral arteries. The left is mildly dominant. No distal vertebral
or vertebrobasilar junction stenosis. Bilateral AICA appear to be
patent and may be dominant. Patent basilar artery with mild
tortuosity. Patent SCA and PCA origins. Both posterior communicating
arteries are present with fetal type bilateral PCA origins. Left PCA
branches are within normal limits. There is a moderate stenosis of
the right PCA P2 (series [5B], image 14). Distal right PCA branches
are within normal limits.

Antegrade flow in both ICA siphons. Mild siphon irregularity without
stenosis. Bilateral ophthalmic and posterior communicating artery
origins appear normal. Patent carotid termini, MCA and ACA origins.
Diminutive or absent anterior communicating artery. Tortuous
proximal right ACA A2 segment, with artifact felt to explain the
apparent flow gap there.

Left MCA M1 is patent with mild irregularity but no significant
stenosis. Left MCA bifurcation and visible MCA branches are within
normal limits. Right MCA M1 is patent with mild irregularity and
stenosis proximal to the bifurcation (series [5B], image 3). Right
MCA bifurcation is patent. But there is a severe stenosis of the
dominant posterior right MCA M2 branch leading up to a bifurcation
(series [5B], image 15 and series 5, image 135). Other visible right
MCA branches are within normal limits.
IMPRESSION: Negative for large vessel occlusion, but positive for intracranial
branch atherosclerosis:
Severe stenosis of a posterior Right MCA M2 branch.

Mild stenosis of the Right MCA M1 segment.

Moderate stenosis of the right PCA P2 segment.

## 2021-07-20 IMAGING — MR MR MRA NECK W/O CM
4 of 7 series · 44 of 48 positions shown · non-contrast
Comparison: Brain MRI [QO] hours today. Intracranial MRA reported
separately.

CLINICAL DATA: [AGE] female with neurologic deficit. Acute
left thalamic lacunar infarct on MRI.

EXAM:
MRA NECK WITHOUT CONTRAST
TECHNIQUE: Angiographic images of the neck were acquired using MRA technique
without intravenous contrast. Carotid stenosis measurements (when
applicable) are obtained utilizing NASCET criteria, using the distal
internal carotid diameter as the denominator.

[Series 7: tof_fl3d_tra_iso · axial · 0.6mm · 0.52mm/px · z∈[-153,-66]mm · 16 of 146 slices shown (1 of 2)]
[im 1/146]
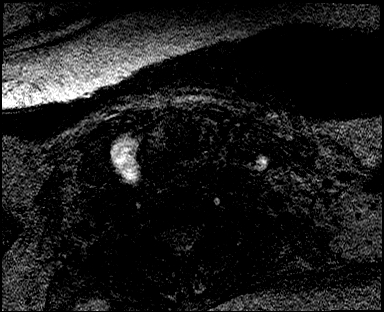
[im 10/146]
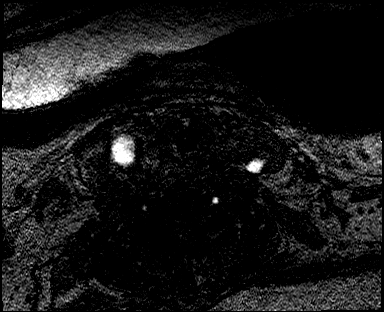
[im 20/146]
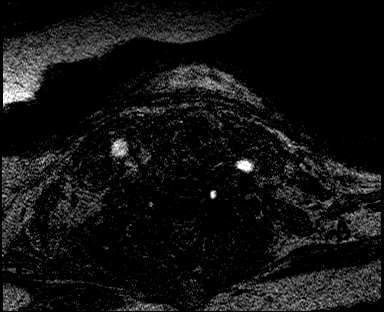
[im 30/146]
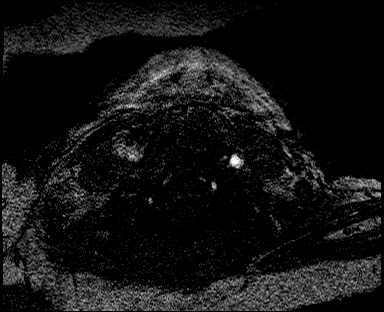
[im 39/146]
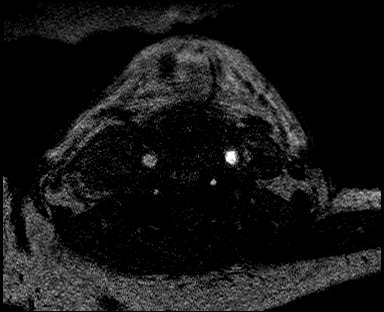
[im 49/146]
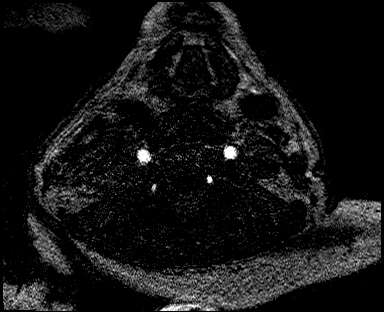
[im 59/146]
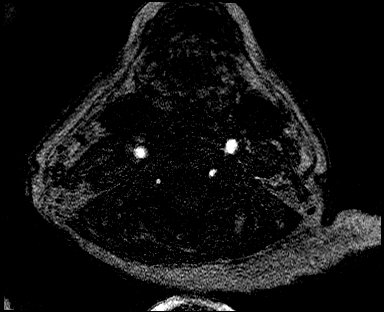
[im 68/146]
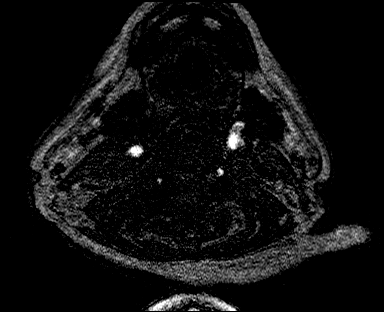
[im 78/146]
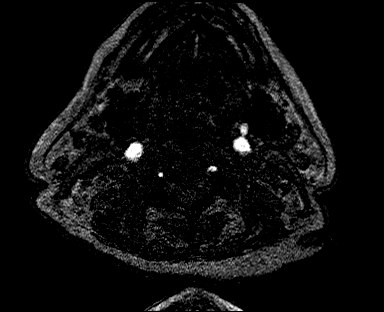
[im 88/146]
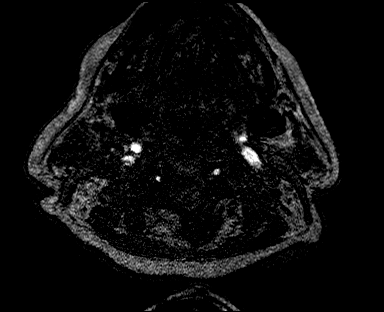
[im 97/146]
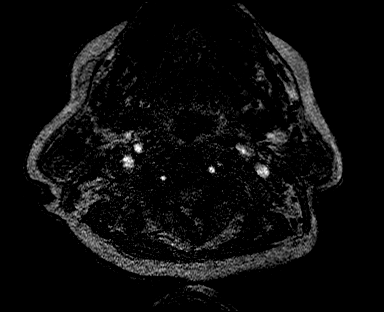
[im 107/146]
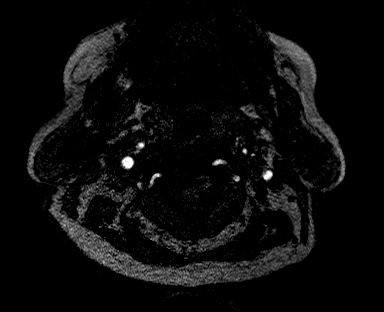
[im 117/146]
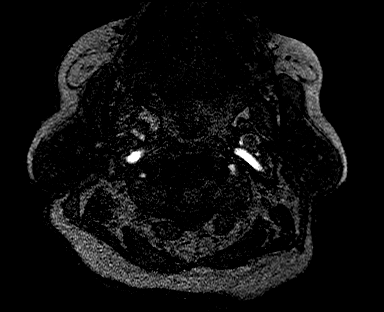
[im 126/146]
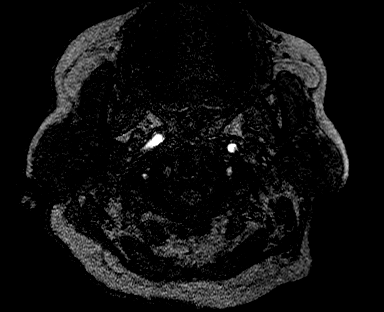
[im 136/146]
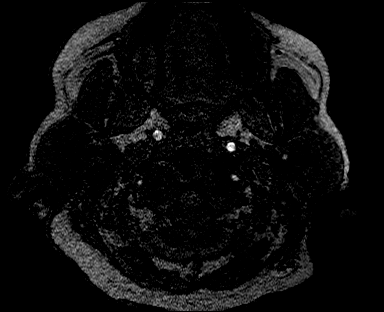
[im 146/146]
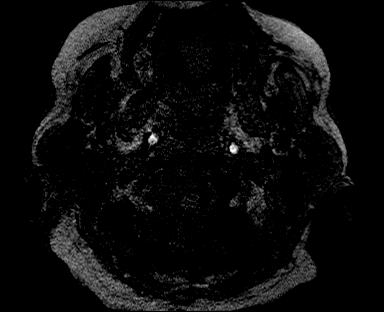

[Series 10: tof_fl2d_tra · axial · 3.0mm · 0.86mm/px · z∈[-248,-9]mm · 13 of 120 slices shown]
[im 1/120]
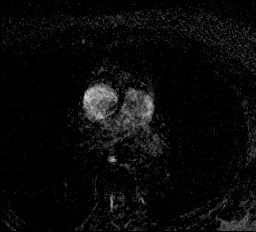
[im 10/120]
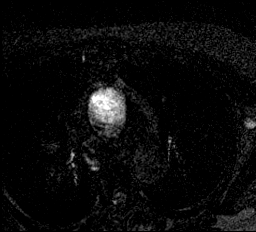
[im 20/120]
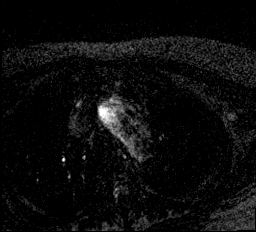
[im 30/120]
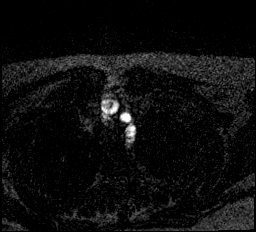
[im 40/120]
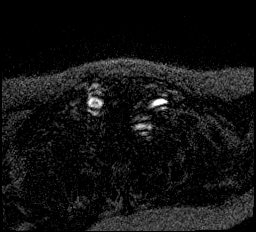
[im 50/120]
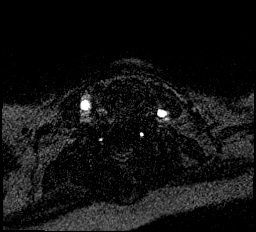
[im 60/120]
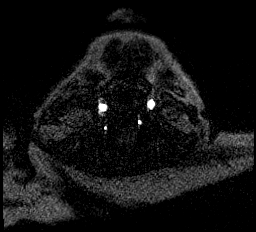
[im 70/120]
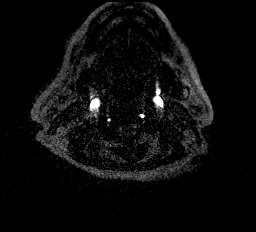
[im 80/120]
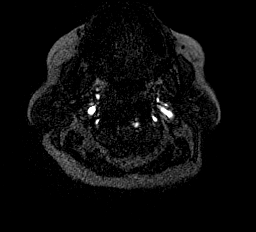
[im 90/120]
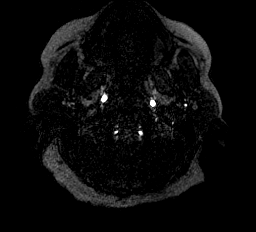
[im 100/120]
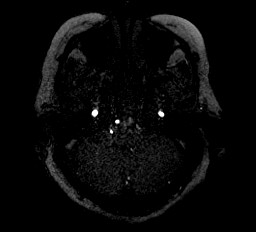
[im 110/120]
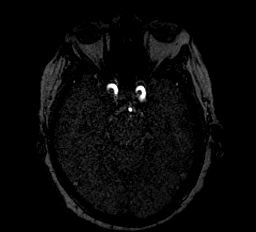
[im 120/120]
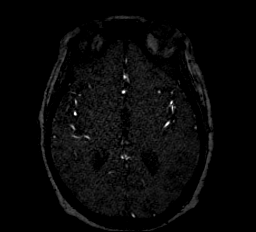

[Series 11: tof_fl3d_tra_iso · axial · 0.6mm · 0.78mm/px · z∈[-142,-63]mm · 14 of 133 slices shown (2 of 2)]
[im 1/133]
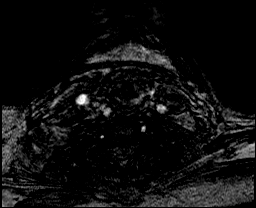
[im 11/133]
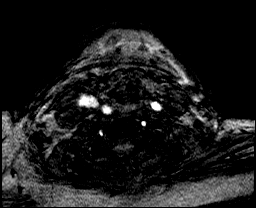
[im 21/133]
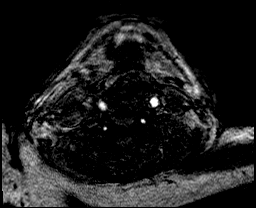
[im 31/133]
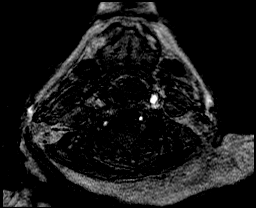
[im 41/133]
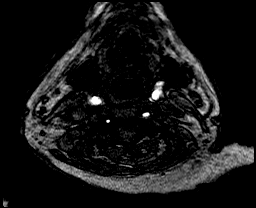
[im 51/133]
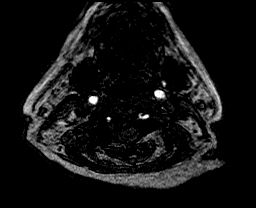
[im 61/133]
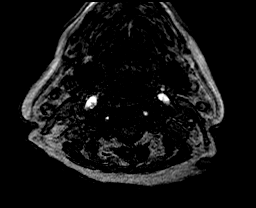
[im 72/133]
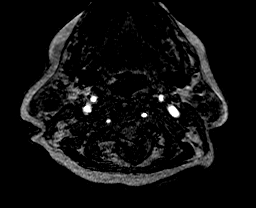
[im 82/133]
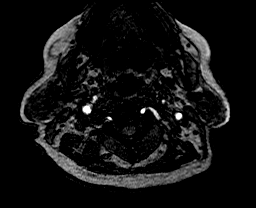
[im 92/133]
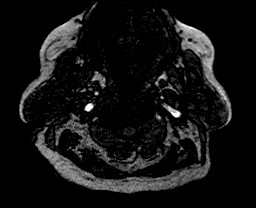
[im 102/133]
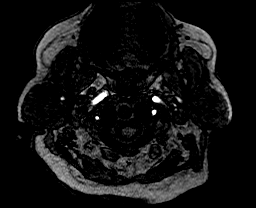
[im 112/133]
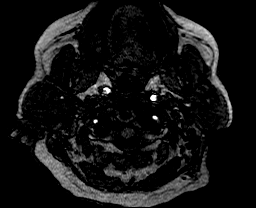
[im 122/133]
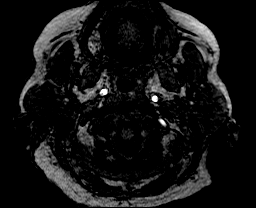
[im 133/133]
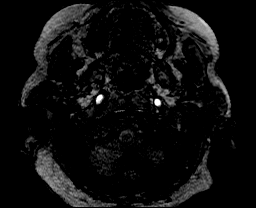

[Series 1025: vert tumble · axial · 0.9mm · 0.23mm/px · 1 of 9 slices shown]
[im 1/9]
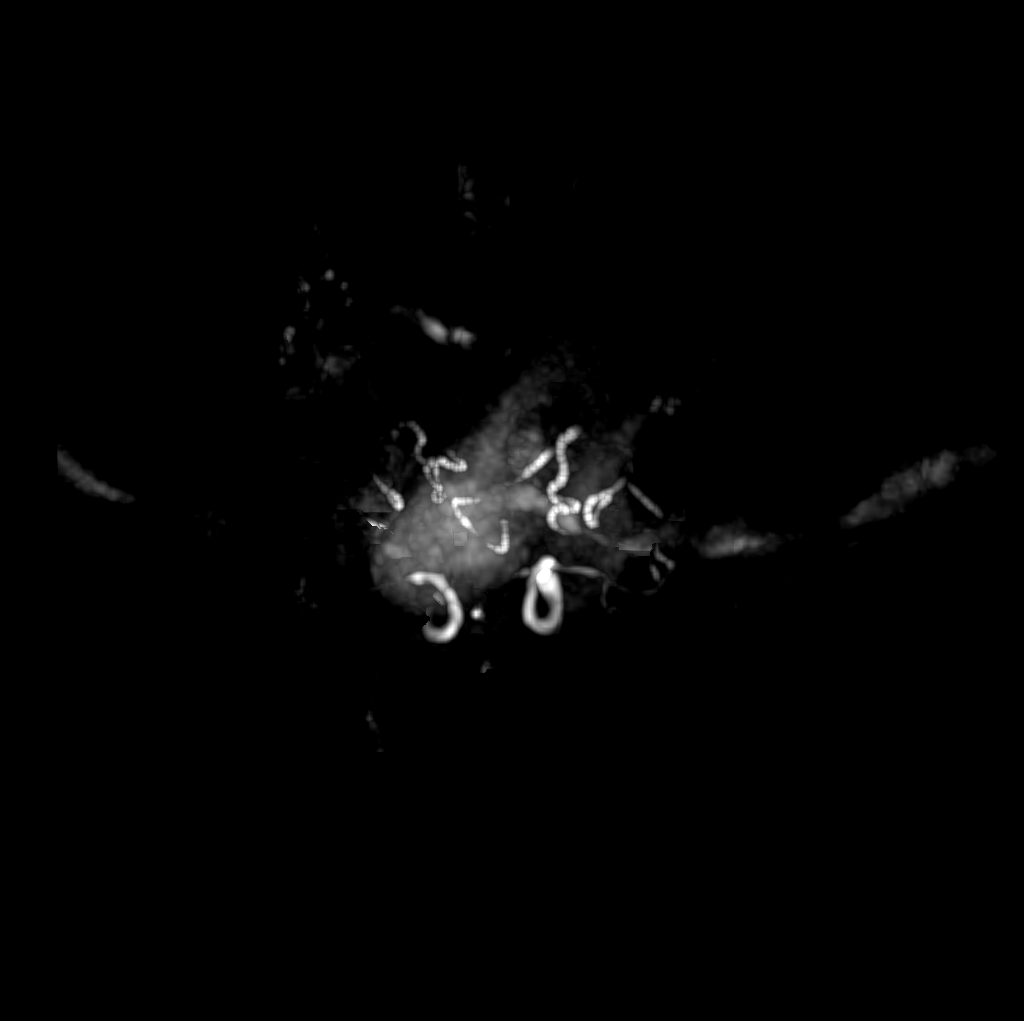

[44 of 48 positions shown; findings below may reference images not displayed]

FINDINGS: Two and 3D time-of-flight neck MRA.

Antegrade flow in the bilateral cervical carotid and vertebral
arteries. Evidence of a 3 vessel arch configuration. Bilateral
carotids are moderately tortuous. Carotid bifurcations appear within
normal limits. No evidence of hemodynamically significant carotid
stenosis in the neck.

Left vertebral artery appears mildly dominant and is tortuous.
Antegrade flow in both vertebral arteries to the skull base without
evidence of hemodynamically significant stenosis.
IMPRESSION: Tortuous cervical carotid and vertebral arteries with no evidence of
hemodynamically significant stenosis.

## 2021-07-20 MED ORDER — ENOXAPARIN SODIUM 30 MG/0.3ML IJ SOSY
30.0000 mg | PREFILLED_SYRINGE | INTRAMUSCULAR | Status: DC
  Administered 2021-07-20 – 2021-07-24 (×5): 30 mg via SUBCUTANEOUS
  Filled 2021-07-20 (×5): qty 0.3

## 2021-07-20 MED ORDER — ONDANSETRON HCL 4 MG PO TABS: 4.0000 mg | ORAL_TABLET | Freq: Four times a day (QID) | ORAL | Status: DC | PRN

## 2021-07-20 MED ORDER — POLYETHYLENE GLYCOL 3350 17 G PO PACK
17.0000 g | PACK | Freq: Every day | ORAL | Status: DC | PRN
  Administered 2021-07-21 – 2021-07-22 (×2): 17 g via ORAL
  Filled 2021-07-20 (×2): qty 1

## 2021-07-20 MED ORDER — ACETAMINOPHEN 650 MG RE SUPP: 650.0000 mg | Freq: Four times a day (QID) | RECTAL | Status: DC | PRN

## 2021-07-20 MED ORDER — PANTOPRAZOLE SODIUM 40 MG PO TBEC
40.0000 mg | DELAYED_RELEASE_TABLET | Freq: Every day | ORAL | Status: DC
  Administered 2021-07-20 – 2021-07-24 (×5): 40 mg via ORAL
  Filled 2021-07-20 (×5): qty 1

## 2021-07-20 MED ORDER — CLOPIDOGREL BISULFATE 75 MG PO TABS
75.0000 mg | ORAL_TABLET | Freq: Every day | ORAL | Status: DC
  Administered 2021-07-20 – 2021-07-24 (×5): 75 mg via ORAL
  Filled 2021-07-20 (×5): qty 1

## 2021-07-20 MED ORDER — STROKE: EARLY STAGES OF RECOVERY BOOK: Freq: Once | Status: DC

## 2021-07-20 MED ORDER — ACETAMINOPHEN 325 MG PO TABS: 650.0000 mg | ORAL_TABLET | Freq: Four times a day (QID) | ORAL | Status: DC | PRN

## 2021-07-20 MED ORDER — ASPIRIN EC 81 MG PO TBEC
81.0000 mg | DELAYED_RELEASE_TABLET | Freq: Every day | ORAL | Status: DC
  Administered 2021-07-20 – 2021-07-24 (×5): 81 mg via ORAL
  Filled 2021-07-20 (×5): qty 1

## 2021-07-20 MED ORDER — ATORVASTATIN CALCIUM 40 MG PO TABS
40.0000 mg | ORAL_TABLET | Freq: Every day | ORAL | Status: DC
  Administered 2021-07-21 – 2021-07-24 (×4): 40 mg via ORAL
  Filled 2021-07-20 (×3): qty 1
  Filled 2021-07-20: qty 4

## 2021-07-20 MED ORDER — MELATONIN 5 MG PO TABS: 10.0000 mg | ORAL_TABLET | Freq: Every evening | ORAL | Status: DC | PRN

## 2021-07-20 MED ORDER — ONDANSETRON HCL 4 MG/2ML IJ SOLN: 4.0000 mg | Freq: Four times a day (QID) | INTRAMUSCULAR | Status: DC | PRN

## 2021-07-20 MED ORDER — ATORVASTATIN CALCIUM 10 MG PO TABS: 20.0000 mg | ORAL_TABLET | Freq: Every day | ORAL | Status: DC

## 2021-07-20 NOTE — H&P (Signed)
History and Physical    Diane Webb YSA:[id number] DOB: 04-May-1927 DOA: 07/19/2021  PCP: Gordan Payment., MD  Patient coming from: Home via EMS   Chief Complaint:  Chief Complaint  Patient presents with   Code Stroke     HPI:    87 year old female with past medical history of chronic kidney disease stage IIIb, hypertension, hyperlipidemia who presents to [facility] emergency department via EMS due to right-sided weakness.  Patient explains that at approximately 930 or so this morning she noticed that she began to have weakness and numbness of the right hand and forearm.  In the hours that followed this progressed to progressive weakness of the right arm as well as weakness of the right lower extremity.  Patient also was experiencing associated right facial droop.  As patient's symptoms persisted she also noticed that she was having difficulty with ambulation due to loss of balance.  After continuing to experience these deficits throughout the day and evening, EMS was contacted who promptly came to evaluate the patient and brought her into [facility] emergency department for evaluation.    Upon evaluation in the emergency department patient was noted to be outside of the tPA window.  Patient was promptly evaluated by Dr. Thomasena Edis with neurology who recommended hospitalization on the hospital service with full work-up and treatment for suspected stroke.  Noncontrast CT head did reveal a hypodensity in the left thalamus.  Patient was initiated on aspirin.  The hospitalist group was then called to assess the patient for admission the hospital. Review of Systems:   Review of Systems  Neurological:  Positive for dizziness, focal weakness and weakness.  All other systems reviewed and are negative.  Past Medical History:  Diagnosis Date   Chronic kidney disease, stage 3b (HCC) 07/20/2021   Essential hypertension 12/01/2015   Mixed hyperlipidemia 12/01/2015    History  reviewed. No pertinent surgical history.   reports that she has never smoked. She has never used smokeless tobacco. No history on file for alcohol use and drug use.  No Known Allergies  Family History  Problem Relation Age of Onset   Stroke Neg Hx      Prior to Admission medications   Not on File    Physical Exam: Vitals:   07/19/21 2336 07/20/21 0000 07/20/21 0030 07/20/21 0400  BP: (!) 185/90 (!) 185/68 (!) 176/78 125/72  Pulse: 89 94 80 76  Resp: 18 (!) 27 (!) 21 18  Temp: 98 F (36.7 C)     TempSrc: Oral     SpO2: 98% 100% 98% 98%    Constitutional: Awake alert and oriented x3, no associated distress.   Skin: no rashes, no lesions, good skin turgor noted. Eyes: Pupils are equally reactive to light.  No evidence of scleral icterus or conjunctival pallor.  ENMT: Moist mucous membranes noted.  Posterior pharynx clear of any exudate or lesions.   Neck: normal, supple, no masses, no thyromegaly.  No evidence of jugular venous distension.   Respiratory: clear to auscultation bilaterally, no wheezing, no crackles. Normal respiratory effort. No accessory muscle use.  Cardiovascular: Regular rate and rhythm, no murmurs / rubs / gallops. No extremity edema. 2+ pedal pulses. No carotid bruits.  Chest:   Nontender without crepitus or deformity.   Back:   Nontender without crepitus or deformity. Abdomen: Abdomen is soft and nontender.  No evidence of intra-abdominal masses.  Positive bowel sounds noted in all quadrants.   Musculoskeletal: No joint deformity upper  and lower extremities. Good ROM, no contractures. Normal muscle tone.  Neurologic: Lethargic but arousable, oriented x3.  Some slurring of speech appreciated.  Notable right facial droop.  Sensation intact.  Notable weakness of the proximal and distal muscle groups of the right upper extremity.  Notable weakness of the distal and proximal muscle groups of the right lower extremity.   Psychiatric: Patient exhibits normal mood  with appropriate affect.  Patient seems to possess insight as to their current situation.     Labs on Admission: I have personally reviewed following labs and imaging studies -   CBC: Recent Labs  Lab 07/19/21 2311 07/20/21 0218  WBC  --  9.5  NEUTROABS  --  6.2  HGB 13.9 12.7  HCT 41.0 39.2  MCV  --  90.7  PLT  --  207   Basic Metabolic Panel: Recent Labs  Lab 07/19/21 2311 07/20/21 0218  NA 139 138  K 4.6 4.5  CL 107 104  CO2  --  24  GLUCOSE 125* 104*  BUN 30* 24*  CREATININE 1.60* 1.52*  CALCIUM  --  9.4   GFR: CrCl cannot be calculated (Unknown ideal weight.). Liver Function Tests: Recent Labs  Lab 07/20/21 0218  AST 19  ALT 15  ALKPHOS 56  BILITOT 0.5  PROT 6.5  ALBUMIN 3.5   No results for input(s): LIPASE, AMYLASE in the last 168 hours. No results for input(s): AMMONIA in the last 168 hours. Coagulation Profile: Recent Labs  Lab 07/20/21 0218  INR 1.1   Cardiac Enzymes: No results for input(s): CKTOTAL, CKMB, CKMBINDEX, TROPONINI in the last 168 hours. BNP (last 3 results) No results for input(s): PROBNP in the last 8760 hours. HbA1C: No results for input(s): HGBA1C in the last 72 hours. CBG: No results for input(s): GLUCAP in the last 168 hours. Lipid Profile: No results for input(s): CHOL, HDL, LDLCALC, TRIG, CHOLHDL, LDLDIRECT in the last 72 hours. Thyroid Function Tests: No results for input(s): TSH, T4TOTAL, FREET4, T3FREE, THYROIDAB in the last 72 hours. Anemia Panel: No results for input(s): VITAMINB12, FOLATE, FERRITIN, TIBC, IRON, RETICCTPCT in the last 72 hours. Urine analysis: No results found for: COLORURINE, APPEARANCEUR, LABSPEC, PHURINE, GLUCOSEU, HGBUR, BILIRUBINUR, KETONESUR, PROTEINUR, UROBILINOGEN, NITRITE, LEUKOCYTESUR  Radiological Exams on Admission - Personally Reviewed: MR BRAIN WO CONTRAST  Result Date: 07/20/2021 CLINICAL DATA:  Neuro deficit, stroke suspected EXAM: MRI HEAD WITHOUT CONTRAST TECHNIQUE:  Multiplanar, multiecho pulse sequences of the brain and surrounding structures were obtained without intravenous contrast. COMPARISON:  No prior MRI, correlation is made with CT head 07/19/2021. FINDINGS: Brain: Restricted diffusion in the left lateral thalamus, which correlates with the hypodensity seen on the 07/19/2021 CT head. No other foci of restricted diffusion. No acute hemorrhage. Nose hemosiderin deposition to suggest remote hemorrhage. No hydrocephalus, extra-axial collection, mass, mass effect, or midline shift. Confluent T2 hyperintense signal in the periventricular white matter and pons, likely the sequela of severe chronic small vessel ischemic disease. Vascular: Normal flow voids. Skull and upper cervical spine: Normal marrow signal. Sinuses/Orbits: Negative.  Status post bilateral lens replacements. Other: The mastoids are well aerated. IMPRESSION: Acute infarct of the left lateral thalamus, which correlates with the hypodensity seen on the same-day CT head. Imaging results were communicated on 07/20/2021 at 2:31 am to provider [facility] via secure text paging. Electronically Signed   By: Wiliam Ke M.D.   On: 07/20/2021 02:33   CT HEAD CODE STROKE WO CONTRAST  Result Date: 07/19/2021 CLINICAL DATA:  Code  stroke. EXAM: CT HEAD WITHOUT CONTRAST TECHNIQUE: Contiguous axial images were obtained from the base of the skull through the vertex without intravenous contrast. COMPARISON:  None. FINDINGS: Brain: Hypodensity in the left lateral thalamus, of indeterminate acuity, possibly acute or subacute infarct. No acute hemorrhage, hydrocephalus, extra-axial collection, mass, mass effect, or midline shift. Periventricular white matter changes, likely the sequela of chronic small vessel ischemic disease. Vascular: No hyperdense vessel. Skull: Normal. Negative for fracture or focal lesion. Sinuses/Orbits: No acute finding. Other: The mastoids are well aerated. ASPECTS [facility] Stroke Program Early CT  Score) - Ganglionic level infarction (caudate, lentiform nuclei, internal capsule, insula, M1-M3 cortex): 7 - Supraganglionic infarction (M4-M6 cortex): 3 Total score (0-10 with 10 being normal): 9 IMPRESSION: 1. Hypodensity in the left lateral thalamus, of indeterminate acuity, but possibly representing an acute or subacute infarct. 2. ASPECTS is 10 Code stroke imaging results were communicated on 07/19/2021 at 11:20 pm to provider [facility] via secure text paging. Electronically Signed   By: Wiliam Ke M.D.   On: 07/19/2021 23:21    EKG: Personally reviewed.  Rhythm is normal sinus rhythm with heart rate of 88 bpm.  No dynamic ST segment changes appreciated.  Assessment/Plan  * Acute stroke due to occlusion of left middle cerebral artery (HCC) MRI imaging revealed: Left lateral thalamic stroke On exam patient still exhibits the following deficit: Notable weakness of the right upper and right lower extremity with notable right facial droop Performing serial neurologic checks Monitoring patient on telemetry Initiating dual antiplatelet therapy with aspirin and Plavix Daily statin therapy Further imaging to include: MRA of the head and neck without contrast Obtaining hemoglobin A1c and lipid panel in the morning Echocardiogram in the morning PT, OT, SLP evaluation Permissive hypertension with as needed antihypertensives only to be given if blood pressure greater than 220/115 Neurology following in consultation.   Chronic kidney disease, stage 3b (HCC) Strict intake and output monitoring Creatinine near baseline Minimizing nephrotoxic agents as much as possible Serial chemistries to monitor renal function and electrolytes   Mixed hyperlipidemia Home medication list is not available and therefore will place patient on a high intensity statin Obtaining lipid panel this morning  Essential hypertension Home medication list currently not available Regardless, allowing permissive  hypertension for now. Will only provide patient with as needed antihypertensive for markedly elevated blood pressure.  GERD without esophagitis Placing patient on regimen of daily PPI therapy.        Code Status:  Full code  code status decision has been confirmed with: patient Family Communication: Daughter has been updated on plan of care  Status is: Inpatient  Remains inpatient appropriate because: Acute stroke requiring close clinical monitoring with serial neurologic checks, extensive work-up including MRI imaging of the head neck and echocardiography with expert consultation with neurology.        Marinda Elk MD [facility] Pager [phone]  If 7PM-7AM, please contact night-coverage [email or web address] Use universal [place] password for that web site. If you do not have the password, please call the hospital operator.  07/20/2021, 4:55 AM

## 2021-07-20 NOTE — Assessment & Plan Note (Signed)
   MRI imaging revealed: Left lateral thalamic stroke  On exam patient still exhibits the following deficit: Notable weakness of the right upper and right lower extremity with notable right facial droop  Performing serial neurologic checks  Monitoring patient on telemetry  Initiating dual antiplatelet therapy with aspirin and Plavix  Daily statin therapy  Further imaging to include: MRA of the head and neck without contrast Obtaining hemoglobin A1c and lipid panel in the morning  Echocardiogram in the morning  PT, OT, SLP evaluation  Permissive hypertension with as needed antihypertensives only to be given if blood pressure greater than 220/115  Neurology following in consultation.

## 2021-07-20 NOTE — Assessment & Plan Note (Signed)
   Home medication list is not available and therefore will place patient on a high intensity statin  Obtaining lipid panel this morning

## 2021-07-20 NOTE — Assessment & Plan Note (Signed)
   Home medication list currently not available  Regardless, allowing permissive hypertension for now.  Will only provide patient with as needed antihypertensive for markedly elevated blood pressure.

## 2021-07-20 NOTE — Progress Notes (Addendum)
STROKE TEAM PROGRESS NOTE   ATTENDING NOTE: I reviewed above note and agree with the assessment and plan. Pt was seen and examined.   87 year old female with history of hypertension, hyperlipidemia, CKD admitted for right sided weakness and numbness, right facial droop.  CT concerning for left thalamic infarct.  MRI confirmed left thalamic infarct.  MRA head and neck showed severe right M2 stenosis, moderate right P2 and mild right M1 stenosis.  2D echo pending, A1c 5.1, LDL 198.  Creatinine 1.52.  Neuro - awake, alert, eyes open, orientated to age, place, time and people. No aphasia, slight dysarthria, following all simple commands. Able to name and repeat. No gaze palsy, tracking bilaterally, visual field full, PERRL. Slight right facial droop. Tongue midline. LUE 5/5, RUE drift 4+/5 with finger grip 4+/5. LLE 4/5 and RLE 4-/5. Sensation decreased on the right face/are/leg, b/l FTN intact grossly although slow on the right, gait not tested.   Etiology for patient stroke likely due to small vessel disease given risk factors.  Recommend aspirin 81 and Plavix 75 DAPT for 3 weeks and then aspirin alone.  Continue Lipitor 40 high intensity statin.  PT/OT pending.  Stroke risk factor modification.  For detailed assessment and plan, please refer to above as I have made changes wherever appropriate.   Neurology will sign off. Please call with questions. Pt will follow up with stroke clinic NP at [facility] in about 4 weeks. Thanks for the consult.   Marvel Plan, MD PhD Stroke Neurology 07/20/2021 2:32 PM    INTERVAL HISTORY Patient is seen in the ED. Her daughter, who cares for her at home, is at the bedside.  She is hemodynamically stable and in no acute distress.  Vitals:   07/20/21 0545 07/20/21 0745 07/20/21 0900 07/20/21 1200  BP: 133/85 (!) 160/93 (!) 161/72 (!) 134/53  Pulse: 68 (!) 108 75 73  Resp: 15 19 18 16   Temp:    98 F (36.7 C)  TempSrc:      SpO2: 97% 97% 99% 99%   CBC:  Recent  Labs  Lab 07/20/21 0218 07/20/21 0829  WBC 9.5 9.4  NEUTROABS 6.2 5.5  HGB 12.7 13.4  HCT 39.2 42.0  MCV 90.7 91.1  PLT 207 229   Basic Metabolic Panel:  Recent Labs  Lab 07/20/21 0218 07/20/21 0829  NA 138 139  K 4.5 4.2  CL 104 102  CO2 24 25  GLUCOSE 104* 109*  BUN 24* 23  CREATININE 1.52* 1.34*  CALCIUM 9.4 9.5  MG  --  2.0   Lipid Panel:  Recent Labs  Lab 07/20/21 0829  CHOL 293*  TRIG 181*  HDL 59  CHOLHDL 5.0  VLDL 36  LDLCALC 07/22/21*   HgbA1c:  Recent Labs  Lab 07/20/21 0829  HGBA1C 5.1   Urine Drug Screen:  Recent Labs  Lab 07/20/21 1040  LABOPIA NONE DETECTED  COCAINSCRNUR NONE DETECTED  LABBENZ NONE DETECTED  AMPHETMU NONE DETECTED  THCU NONE DETECTED  LABBARB NONE DETECTED    Alcohol Level  Recent Labs  Lab 07/20/21 0829  ETH <10    IMAGING past 24 hours MR ANGIO HEAD WO CONTRAST  Result Date: 07/20/2021 CLINICAL DATA:  87 year old female with neurologic deficit. Acute left thalamic lacunar infarct on MRI. EXAM: MRA HEAD WITHOUT CONTRAST TECHNIQUE: Angiographic images of the Circle of Willis were acquired using MRA technique without intravenous contrast. COMPARISON:  Brain MRI 0133 hours today. Neck MRA today reported separately. FINDINGS: No intracranial mass effect  or ventriculomegaly. Antegrade flow in the posterior circulation with patent distal vertebral arteries. The left is mildly dominant. No distal vertebral or vertebrobasilar junction stenosis. Bilateral AICA appear to be patent and may be dominant. Patent basilar artery with mild tortuosity. Patent SCA and PCA origins. Both posterior communicating arteries are present with fetal type bilateral PCA origins. Left PCA branches are within normal limits. There is a moderate stenosis of the right PCA P2 (series 1013, image 14). Distal right PCA branches are within normal limits. Antegrade flow in both ICA siphons. Mild siphon irregularity without stenosis. Bilateral ophthalmic and  posterior communicating artery origins appear normal. Patent carotid termini, MCA and ACA origins. Diminutive or absent anterior communicating artery. Tortuous proximal right ACA A2 segment, with artifact felt to explain the apparent flow gap there. Left MCA M1 is patent with mild irregularity but no significant stenosis. Left MCA bifurcation and visible MCA branches are within normal limits. Right MCA M1 is patent with mild irregularity and stenosis proximal to the bifurcation (series 1003, image 3). Right MCA bifurcation is patent. But there is a severe stenosis of the dominant posterior right MCA M2 branch leading up to a bifurcation (series 1007, image 15 and series 5, image 135). Other visible right MCA branches are within normal limits. IMPRESSION: Negative for large vessel occlusion, but positive for intracranial branch atherosclerosis: Severe stenosis of a posterior Right MCA M2 branch. Mild stenosis of the Right MCA M1 segment. Moderate stenosis of the right PCA P2 segment. Electronically Signed   By: Odessa Fleming M.D.   On: 07/20/2021 07:28   MR ANGIO NECK WO CONTRAST  Result Date: 07/20/2021 CLINICAL DATA:  87 year old female with neurologic deficit. Acute left thalamic lacunar infarct on MRI. EXAM: MRA NECK WITHOUT CONTRAST TECHNIQUE: Angiographic images of the neck were acquired using MRA technique without intravenous contrast. Carotid stenosis measurements (when applicable) are obtained utilizing NASCET criteria, using the distal internal carotid diameter as the denominator. COMPARISON:  Brain MRI 0137 hours today. Intracranial MRA reported separately. FINDINGS: Two and 3D time-of-flight neck MRA. Antegrade flow in the bilateral cervical carotid and vertebral arteries. Evidence of a 3 vessel arch configuration. Bilateral carotids are moderately tortuous. Carotid bifurcations appear within normal limits. No evidence of hemodynamically significant carotid stenosis in the neck. Left vertebral artery  appears mildly dominant and is tortuous. Antegrade flow in both vertebral arteries to the skull base without evidence of hemodynamically significant stenosis. IMPRESSION: Tortuous cervical carotid and vertebral arteries with no evidence of hemodynamically significant stenosis. Electronically Signed   By: Odessa Fleming M.D.   On: 07/20/2021 07:23   MR BRAIN WO CONTRAST  Result Date: 07/20/2021 CLINICAL DATA:  Neuro deficit, stroke suspected EXAM: MRI HEAD WITHOUT CONTRAST TECHNIQUE: Multiplanar, multiecho pulse sequences of the brain and surrounding structures were obtained without intravenous contrast. COMPARISON:  No prior MRI, correlation is made with CT head 07/19/2021. FINDINGS: Brain: Restricted diffusion in the left lateral thalamus, which correlates with the hypodensity seen on the 07/19/2021 CT head. No other foci of restricted diffusion. No acute hemorrhage. Nose hemosiderin deposition to suggest remote hemorrhage. No hydrocephalus, extra-axial collection, mass, mass effect, or midline shift. Confluent T2 hyperintense signal in the periventricular white matter and pons, likely the sequela of severe chronic small vessel ischemic disease. Vascular: Normal flow voids. Skull and upper cervical spine: Normal marrow signal. Sinuses/Orbits: Negative.  Status post bilateral lens replacements. Other: The mastoids are well aerated. IMPRESSION: Acute infarct of the left lateral thalamus, which correlates with the  hypodensity seen on the same-day CT head. Imaging results were communicated on 07/20/2021 at 2:31 am to provider [facility] via secure text paging. Electronically Signed   By: Wiliam Ke M.D.   On: 07/20/2021 02:33   CT HEAD CODE STROKE WO CONTRAST  Result Date: 07/19/2021 CLINICAL DATA:  Code stroke. EXAM: CT HEAD WITHOUT CONTRAST TECHNIQUE: Contiguous axial images were obtained from the base of the skull through the vertex without intravenous contrast. COMPARISON:  None. FINDINGS: Brain: Hypodensity in  the left lateral thalamus, of indeterminate acuity, possibly acute or subacute infarct. No acute hemorrhage, hydrocephalus, extra-axial collection, mass, mass effect, or midline shift. Periventricular white matter changes, likely the sequela of chronic small vessel ischemic disease. Vascular: No hyperdense vessel. Skull: Normal. Negative for fracture or focal lesion. Sinuses/Orbits: No acute finding. Other: The mastoids are well aerated. ASPECTS [facility] Stroke Program Early CT Score) - Ganglionic level infarction (caudate, lentiform nuclei, internal capsule, insula, M1-M3 cortex): 7 - Supraganglionic infarction (M4-M6 cortex): 3 Total score (0-10 with 10 being normal): 9 IMPRESSION: 1. Hypodensity in the left lateral thalamus, of indeterminate acuity, but possibly representing an acute or subacute infarct. 2. ASPECTS is 10 Code stroke imaging results were communicated on 07/19/2021 at 11:20 pm to provider [facility] via secure text paging. Electronically Signed   By: Wiliam Ke M.D.   On: 07/19/2021 23:21    PHYSICAL EXAM Patient is an alert, well-nourished female in no acute distress.  Neurological exam:  NEURO:  Mental Status: AA&Ox3  Speech/Language: speech is without dysarthria or aphasia.  Repetition, fluency, and comprehension intact.  Cranial Nerves:  III, IV, VI: Eyelids elevate symmetrically.  V: Sensation is intact to light touch and symmetrical to face.  VII: Right sided facial droop noted. Able to raise eyebrows.  VIII: hearing intact to voice. IX, X: Phonation is normal.  XII: tongue is midline without fasciculations. Motor: 5/5 strength to LUE and LLE. 4/5 strength in RUE and 4.5/5 strength in RLE Tone: is normal and bulk is normal Sensation- Diminished to light touch on right side Extinction on right side to light touch.  Coordination: Right sided pronator drift noted  Gait- deferred    ASSESSMENT/PLAN [name] is a 87 y.o. right handed female with history of  CKD, HTN and HLD presenting with right sided weakness and a right facial droop.  She was outside the window for TPA, and no large vessel occlusion was found on MRA.  She has been admitted for workup of acute stroke.  Stroke - left lateral thalamic infarct, likely small vessel disease Code Stroke CT head hypodensity in left thalamus representing acute or subacute infarct. Small vessel disease.  ASPECTS 10.  MRI  Acute infarct of the left lateral thalamus MRA  Severe posterior right MCA M2 segment, mild stenosis of right MCA M1 segment, moderate stenosis of right PCA P2 segment, tortuous cervical carotid and vertebral arteries with no evidence of hemodynamically significant stenosis. 2D Echo pending LDL 198 HgbA1c 5.1 VTE prophylaxis - lovenox, per primary team No antithrombotic prior to admission, now on aspirin 81 mg daily and clopidogrel 75 mg daily for 3 weeks and then ASA alone.  Therapy recommendations:  pending Disposition:  pending  Hypertension Home meds:  losartan/HCTZ 100/25 mg daily Stable Permissive hypertension (OK if < 220/120) but gradually normalize in 2-3 days Long-term BP goal normotensive  Hyperlipidemia Home meds:  none LDL 198, goal < 70 Add atorvastatin 40 mg daily Continue statin at discharge  Other Stroke Risk Factors  Advanced Age >/= 69  Substance abuse - UDS:  pending Obesity, There is no height or weight on file to calculate BMI., BMI >/= 30 associated with increased stroke risk, recommend weight loss, diet and exercise as appropriate   Other Active Problems CKD IIIb, Cre 1.52, Renally dose medications as appropriate, Monitor daily Bun/Cr, Gentle IVF bolus given after IV contrast administration due to Cr 1.52  Hospital day # 0   To contact Stroke Continuity provider, please refer to [email or web address]. After hours, contact General Neurology

## 2021-07-20 NOTE — Plan of Care (Signed)

## 2021-07-20 NOTE — Assessment & Plan Note (Signed)
.   Placing patient on regimen of daily PPI therapy.

## 2021-07-20 NOTE — ED Notes (Signed)
Patient resting in stretcher, no distress noted. Family at bedside

## 2021-07-20 NOTE — Progress Notes (Signed)
[facility] PROGRESS NOTE    Progress Note  Diane Webb  KYH:[id number] DOB: 1926-10-18 DOA: 07/19/2021 PCP: Gordan Payment., MD     Brief Narrative:   Diane Webb is an 87 y.o. female past medical history significant of chronic kidney disease stage IIIb, essential hypertension comes into the [facility] for right-sided weakness that started on the morning of admission, throughout the day it progressively got worse came into the ED CT of the head did reveal hypodensity in the left thalamus, MRI of the brain showed an acute infarct in the left thalamus, MRA of the head and neck is pending started on aspirin   Assessment/Plan:   Acute stroke due to occlusion of left middle cerebral artery (HCC) HgbA1c, fasting lipid panel pending MRI, MRA of the brain without contrast pending PT, OT, Speech consult pending transthoracic Echo pending Was previously not on aspirin, will Start patient on ASA 81mg  daily and plavix 75mg  daily  Start high-dose statin, although at this age I will believe she will get much benefit. BP goal: permissive HTN upto 220/120 mmHg Telemetry monitoring  Essential hypertension Hold antihypertensive medications allow permissive hypertension.  GERD without esophagitis Continue PPI.  Chronic kidney disease, stage 3b (HCC) With a baseline creatinine around 1.2, admission 1.  5.  Continue to monitor.   DVT prophylaxis: lovenox Family Communication:none Status is: Inpatient  Remains inpatient appropriate because: Due to her severity of illness and stroke work-up.        Code Status:     Code Status Orders  (From admission, onward)           Start     Ordered   07/20/21 0455  Full code  Continuous        07/20/21 0454           Code Status History     This patient has a current code status but no historical code status.         IV Access:   Peripheral IV   Procedures and diagnostic studies:   MR BRAIN WO  CONTRAST  Result Date: 07/20/2021 CLINICAL DATA:  Neuro deficit, stroke suspected EXAM: MRI HEAD WITHOUT CONTRAST TECHNIQUE: Multiplanar, multiecho pulse sequences of the brain and surrounding structures were obtained without intravenous contrast. COMPARISON:  No prior MRI, correlation is made with CT head 07/19/2021. FINDINGS: Brain: Restricted diffusion in the left lateral thalamus, which correlates with the hypodensity seen on the 07/19/2021 CT head. No other foci of restricted diffusion. No acute hemorrhage. Nose hemosiderin deposition to suggest remote hemorrhage. No hydrocephalus, extra-axial collection, mass, mass effect, or midline shift. Confluent T2 hyperintense signal in the periventricular white matter and pons, likely the sequela of severe chronic small vessel ischemic disease. Vascular: Normal flow voids. Skull and upper cervical spine: Normal marrow signal. Sinuses/Orbits: Negative.  Status post bilateral lens replacements. Other: The mastoids are well aerated. IMPRESSION: Acute infarct of the left lateral thalamus, which correlates with the hypodensity seen on the same-day CT head. Imaging results were communicated on 07/20/2021 at 2:31 am to provider [facility] via secure text paging. Electronically Signed   By: 07/22/2021 M.D.   On: 07/20/2021 02:33   CT HEAD CODE STROKE WO CONTRAST  Result Date: 07/19/2021 CLINICAL DATA:  Code stroke. EXAM: CT HEAD WITHOUT CONTRAST TECHNIQUE: Contiguous axial images were obtained from the base of the skull through the vertex without intravenous contrast. COMPARISON:  None. FINDINGS: Brain: Hypodensity in the left lateral thalamus, of indeterminate acuity, possibly acute  or subacute infarct. No acute hemorrhage, hydrocephalus, extra-axial collection, mass, mass effect, or midline shift. Periventricular white matter changes, likely the sequela of chronic small vessel ischemic disease. Vascular: No hyperdense vessel. Skull: Normal. Negative for fracture or  focal lesion. Sinuses/Orbits: No acute finding. Other: The mastoids are well aerated. ASPECTS [facility] Stroke Program Early CT Score) - Ganglionic level infarction (caudate, lentiform nuclei, internal capsule, insula, M1-M3 cortex): 7 - Supraganglionic infarction (M4-M6 cortex): 3 Total score (0-10 with 10 being normal): 9 IMPRESSION: 1. Hypodensity in the left lateral thalamus, of indeterminate acuity, but possibly representing an acute or subacute infarct. 2. ASPECTS is 10 Code stroke imaging results were communicated on 07/19/2021 at 11:20 pm to provider [facility] via secure text paging. Electronically Signed   By: Wiliam Ke M.D.   On: 07/19/2021 23:21     Medical Consultants:   None.   Subjective:    Diane Webb relates her right side still weak anorexic  Objective:    Vitals:   07/20/21 0445 07/20/21 0500 07/20/21 0530 07/20/21 0545  BP: 136/67 (!) 154/83 139/79 133/85  Pulse: 71 72 71 68  Resp: 17 10 13 15   Temp:      TempSrc:      SpO2: 95% 96% 98% 97%   SpO2: 97 %  No intake or output data in the 24 hours ending 07/20/21 0701 There were no vitals filed for this visit.  Exam: General exam: In no acute distress. Respiratory system: Good air movement and clear to auscultation. Cardiovascular system: S1 & S2 heard, RRR.  Gastrointestinal system: Abdomen is nondistended, soft and nontender.  Extremities: No pedal edema. Skin: No rashes, lesions or ulcers Psychiatry: Judgement and insight appear normal. Mood & affect appropriate.    Data Reviewed:    Labs: Basic Metabolic Panel: Recent Labs  Lab 07/19/21 2311 07/20/21 0218  NA 139 138  K 4.6 4.5  CL 107 104  CO2  --  24  GLUCOSE 125* 104*  BUN 30* 24*  CREATININE 1.60* 1.52*  CALCIUM  --  9.4   GFR CrCl cannot be calculated (Unknown ideal weight.). Liver Function Tests: Recent Labs  Lab 07/20/21 0218  AST 19  ALT 15  ALKPHOS 56  BILITOT 0.5  PROT 6.5  ALBUMIN 3.5   No results for input(s):  LIPASE, AMYLASE in the last 168 hours. No results for input(s): AMMONIA in the last 168 hours. Coagulation profile Recent Labs  Lab 07/20/21 0218  INR 1.1   COVID-19 Labs  No results for input(s): DDIMER, FERRITIN, LDH, CRP in the last 72 hours.  Lab Results  Component Value Date   SARSCOV2NAA NEGATIVE 07/19/2021    CBC: Recent Labs  Lab 07/19/21 2311 07/20/21 0218  WBC  --  9.5  NEUTROABS  --  6.2  HGB 13.9 12.7  HCT 41.0 39.2  MCV  --  90.7  PLT  --  207   Cardiac Enzymes: No results for input(s): CKTOTAL, CKMB, CKMBINDEX, TROPONINI in the last 168 hours. BNP (last 3 results) No results for input(s): PROBNP in the last 8760 hours. CBG: No results for input(s): GLUCAP in the last 168 hours. D-Dimer: No results for input(s): DDIMER in the last 72 hours. Hgb A1c: No results for input(s): HGBA1C in the last 72 hours. Lipid Profile: No results for input(s): CHOL, HDL, LDLCALC, TRIG, CHOLHDL, LDLDIRECT in the last 72 hours. Thyroid function studies: No results for input(s): TSH, T4TOTAL, T3FREE, THYROIDAB in the last 72 hours.  Invalid input(s):  FREET3 Anemia work up: No results for input(s): VITAMINB12, FOLATE, FERRITIN, TIBC, IRON, RETICCTPCT in the last 72 hours. Sepsis Labs: Recent Labs  Lab 07/20/21 0218  WBC 9.5   Microbiology Recent Results (from the past 240 hour(s))  Resp Panel by RT-PCR (Flu A&B, Covid) Nasopharyngeal Swab     Status: None   Collection Time: 07/19/21 11:06 PM   Specimen: Nasopharyngeal Swab; Nasopharyngeal(NP) swabs in vial transport medium  Result Value Ref Range Status   SARS Coronavirus 2 by RT PCR NEGATIVE NEGATIVE Final    Comment: (NOTE) SARS-CoV-2 target nucleic acids are NOT DETECTED.  The SARS-CoV-2 RNA is generally detectable in upper respiratory specimens during the acute phase of infection. The lowest concentration of SARS-CoV-2 viral copies this assay can detect is 138 copies/mL. A negative result does not preclude  SARS-Cov-2 infection and should not be used as the sole basis for treatment or other patient management decisions. A negative result may occur with  improper specimen collection/handling, submission of specimen other than nasopharyngeal swab, presence of viral mutation(s) within the areas targeted by this assay, and inadequate number of viral copies(<138 copies/mL). A negative result must be combined with clinical observations, patient history, and epidemiological information. The expected result is Negative.  Fact Sheet for Patients:  BloggerCourse.com  Fact Sheet for Healthcare Providers:  SeriousBroker.it  This test is no t yet approved or cleared by the [place] FDA and  has been authorized for detection and/or diagnosis of SARS-CoV-2 by FDA under an Emergency Use Authorization (EUA). This EUA will remain  in effect (meaning this test can be used) for the duration of the COVID-19 declaration under Section 564(b)(1) of the Act, 21 U.S.C.section 360bbb-3(b)(1), unless the authorization is terminated  or revoked sooner.       Influenza A by PCR NEGATIVE NEGATIVE Final   Influenza B by PCR NEGATIVE NEGATIVE Final    Comment: (NOTE) The Xpert Xpress SARS-CoV-2/FLU/RSV plus assay is intended as an aid in the diagnosis of influenza from Nasopharyngeal swab specimens and should not be used as a sole basis for treatment. Nasal washings and aspirates are unacceptable for Xpert Xpress SARS-CoV-2/FLU/RSV testing.  Fact Sheet for Patients: BloggerCourse.com  Fact Sheet for Healthcare Providers: SeriousBroker.it  This test is not yet approved or cleared by the [place] FDA and has been authorized for detection and/or diagnosis of SARS-CoV-2 by FDA under an Emergency Use Authorization (EUA). This EUA will remain in effect (meaning this test can be used) for the duration of  the COVID-19 declaration under Section 564(b)(1) of the Act, 21 U.S.C. section 360bbb-3(b)(1), unless the authorization is terminated or revoked.  Performed at [facility], [address], [place], [place] [address]      Medications:     stroke: mapping our early stages of recovery book   Does not apply Once   aspirin EC  81 mg Oral Daily   clopidogrel  75 mg Oral Daily   enoxaparin (LOVENOX) injection  30 mg Subcutaneous Q24H   pantoprazole  40 mg Oral Daily   Continuous Infusions:    LOS: 0 days   Marinda Elk  [facility]  07/20/2021, 7:01 AM

## 2021-07-20 NOTE — Assessment & Plan Note (Signed)
Strict intake and output monitoring Creatinine near baseline Minimizing nephrotoxic agents as much as possible Serial chemistries to monitor renal function and electrolytes  

## 2021-07-20 NOTE — Progress Notes (Signed)
  Echocardiogram 2D Echocardiogram has been performed.  Diane Webb 07/20/2021, 3:42 PM

## 2021-07-21 ENCOUNTER — Other Ambulatory Visit (HOSPITAL_COMMUNITY): Payer: Self-pay

## 2021-07-21 ENCOUNTER — Other Ambulatory Visit: Payer: Self-pay

## 2021-07-21 MED ORDER — ATORVASTATIN CALCIUM 40 MG PO TABS
40.0000 mg | ORAL_TABLET | Freq: Every day | ORAL | 0 refills | Status: DC
  Filled 2021-07-21: qty 30, 30d supply, fill #0

## 2021-07-21 MED ORDER — CLOPIDOGREL BISULFATE 75 MG PO TABS
75.0000 mg | ORAL_TABLET | Freq: Every day | ORAL | 0 refills | Status: DC
  Filled 2021-07-21: qty 21, 21d supply, fill #0

## 2021-07-21 MED ORDER — ASPIRIN 81 MG PO TBEC
81.0000 mg | DELAYED_RELEASE_TABLET | Freq: Every day | ORAL | 11 refills | Status: AC
  Filled 2021-07-21: qty 30, 30d supply, fill #0

## 2021-07-21 NOTE — Evaluation (Signed)
Occupational Therapy Evaluation Patient Details Name: Diane Webb MRN: [id number] DOB: 04-06-1927 Today's Date: 07/21/2021   History of Present Illness 87 y.o. F admitted on 10/19 due to R sided weakness. MRI showed an acute infarct in the L thalamus. PMH significant for CKD3, HTN, and HLD.   Clinical Impression   Pt admitted for concerns listed above. PTA pt reported that she was independent with all ADL's, using a Rollator, and her daughter provided all IADL's for her. At this time, pt is having increased weakness and apraxia on her R side, requiring increased time and effort for all movements. Pt needs mod A for all LB ADL's and transfers, as pt has difficulty ambulating her RLE and bearing weight/gripping with her RUE. Recommending CIR at this time as pt will benefit from intensive therapies to return to her PLOF and increase safety. OT will follow up acutely.       Recommendations for follow up therapy are one component of a multi-disciplinary discharge planning process, led by the attending physician.  Recommendations may be updated based on patient status, additional functional criteria and insurance authorization.   Follow Up Recommendations  CIR    Equipment Recommendations  Other (comment) (TBD)    Recommendations for Other Services Rehab consult     Precautions / Restrictions Precautions Precautions: Fall Restrictions Weight Bearing Restrictions: No      Mobility Bed Mobility Overal bed mobility: Needs Assistance Bed Mobility: Supine to Sit;Sit to Supine     Supine to sit: Min assist Sit to supine: Min guard   General bed mobility comments: Min A to elevate trunk to sitting EOB of maintain balance    Transfers Overall transfer level: Needs assistance Equipment used: Rolling walker (2 wheeled) Transfers: Sit to/from Stand Sit to Stand: Mod assist         General transfer comment: Mod A to power up and maintain balance as pt was leaning to the R side  heavily    Balance Overall balance assessment: Needs assistance Sitting-balance support: Feet supported;No upper extremity supported Sitting balance-Leahy Scale: Fair Sitting balance - Comments: Pt waivering at times, requiring intermittent support to maintain upright posture Postural control: Right lateral lean Standing balance support: Bilateral upper extremity supported Standing balance-Leahy Scale: Poor Standing balance comment: Pt leaning to the R initially in standing                           ADL either performed or assessed with clinical judgement   ADL Overall ADL's : Needs assistance/impaired Eating/Feeding: Set up Eating/Feeding Details (indicate cue type and reason): Pt reports that it feels a lot messier now due to having to use her LUE to assist at times when she can't keep ahold ofthe utensils Grooming: Set up;Sitting   Upper Body Bathing: Min guard;Sitting   Lower Body Bathing: Moderate assistance;Sitting/lateral leans;Sit to/from stand   Upper Body Dressing : Min guard;Sitting   Lower Body Dressing: Moderate assistance;Sitting/lateral leans;Sit to/from stand   Toilet Transfer: Moderate assistance;Stand-pivot   Toileting- Clothing Manipulation and Hygiene: Moderate assistance;Sitting/lateral lean;Sit to/from stand       Functional mobility during ADLs: Moderate assistance;Rolling walker General ADL Comments: Pt R side is very weak, requiring her to need Increased support on the R side and to complete all ADL's that require standing and functional mobility.     Vision Baseline Vision/History: 1 Wears glasses Ability to See in Adequate Light: 1 Impaired Patient Visual Report: No change from baseline  Vision Assessment?: No apparent visual deficits     Perception Perception Perception Tested?: No   Praxis Praxis Praxis tested?: Deficits Deficits: Motor Impersistence    Pertinent Vitals/Pain Pain Assessment: No/denies pain     Hand  Dominance Right   Extremity/Trunk Assessment Upper Extremity Assessment Upper Extremity Assessment: RUE deficits/detail RUE Deficits / Details: Pt presenting with decreased coordination, numbness, and weakness in RUE. RUE Sensation: decreased light touch;decreased proprioception RUE Coordination: decreased fine motor   Lower Extremity Assessment Lower Extremity Assessment: Defer to PT evaluation   Cervical / Trunk Assessment Cervical / Trunk Assessment: Kyphotic   Communication Communication Communication: No difficulties   Cognition Arousal/Alertness: Awake/alert Behavior During Therapy: WFL for tasks assessed/performed Overall Cognitive Status: Within Functional Limits for tasks assessed                                 General Comments: Pt very aware that she is too weak and unsafe to go home   General Comments  Max HR seen in standing 141 BPM, BP 169/96    Exercises     Shoulder Instructions      Home Living Family/patient expects to be discharged to:: Private residence Living Arrangements: Children Available Help at Discharge: Available 24 hours/day;Family Type of Home: House Home Access: Level entry     Home Layout: One level     Bathroom Shower/Tub: Producer, television/film/video: Handicapped height Bathroom Accessibility: Yes How Accessible: Accessible via walker Home Equipment: Walker - 4 wheels;Shower seat      Lives With: Daughter;Family    Prior Functioning/Environment Level of Independence: Independent with assistive device(s)        Comments: Reports that her daughter assists with IADL's, however she is able to do all ADL's on her own.        OT Problem List: Decreased strength;Decreased activity tolerance;Impaired balance (sitting and/or standing);Decreased coordination;Decreased cognition;Decreased safety awareness;Impaired UE functional use      OT Treatment/Interventions: Self-care/ADL training;Therapeutic  exercise;Energy conservation;DME and/or AE instruction;Therapeutic activities;Neuromuscular education;Patient/family education;Balance training    OT Goals(Current goals can be found in the care plan section) Acute Rehab OT Goals Patient Stated Goal: To get back to PLOF OT Goal Formulation: With patient Time For Goal Achievement: 08/04/21 Potential to Achieve Goals: Good ADL Goals Pt Will Perform Eating: with modified independence;with adaptive utensils;sitting Pt Will Perform Grooming: with modified independence;with adaptive equipment;sitting Pt Will Perform Lower Body Bathing: with supervision;sitting/lateral leans;sit to/from stand Pt Will Perform Lower Body Dressing: with supervision;sitting/lateral leans;sit to/from stand Pt Will Transfer to Toilet: with modified independence;stand pivot transfer Pt Will Perform Toileting - Clothing Manipulation and hygiene: with supervision;sitting/lateral leans;sit to/from stand  OT Frequency: Min 2X/week   Barriers to D/C:            Co-evaluation              AM-PAC OT "6 Clicks" Daily Activity     Outcome Measure Help from another person eating meals?: A Little Help from another person taking care of personal grooming?: A Little Help from another person toileting, which includes using toliet, bedpan, or urinal?: A Lot Help from another person bathing (including washing, rinsing, drying)?: A Lot Help from another person to put on and taking off regular upper body clothing?: A Little Help from another person to put on and taking off regular lower body clothing?: A Lot 6 Click Score: 15   End of Session  Equipment Utilized During Treatment: Careers adviser Communication: Mobility status  Activity Tolerance: Patient tolerated treatment well Patient left: in bed;with call bell/phone within reach;with bed alarm set  OT Visit Diagnosis: Unsteadiness on feet (R26.81);Other abnormalities of gait and mobility (R26.89);Muscle  weakness (generalized) (M62.81);Apraxia (R48.2)                Time: 2836-6294 OT Time Calculation (min): 23 min Charges:  OT General Charges $OT Visit: 1 Visit OT Evaluation $OT Eval Moderate Complexity: 1 Mod OT Treatments $Therapeutic Activity: 8-22 mins  Lequisha Cammack H., OTR/L Acute Rehabilitation  Michaela Broski Elane Barkley [place]vil 07/21/2021, 1:25 PM

## 2021-07-21 NOTE — Evaluation (Signed)
Speech Language Pathology Evaluation Patient Details Name: Diane Webb MRN: [id number] DOB: 1926/10/02 Today's Date: 07/21/2021 Time: 1005-1030 SLP Time Calculation (min) (ACUTE ONLY): 25 min  Problem List:  Patient Active Problem List   Diagnosis Date Noted   Acute stroke due to occlusion of left middle cerebral artery (HCC) 07/20/2021   Chronic kidney disease, stage 3b (HCC) 07/20/2021   GERD without esophagitis 04/16/2016   Essential hypertension 12/01/2015   Mixed hyperlipidemia 12/01/2015   Past Medical History:  Past Medical History:  Diagnosis Date   Chronic kidney disease, stage 3b (HCC) 07/20/2021   Essential hypertension 12/01/2015   Mixed hyperlipidemia 12/01/2015   Past Surgical History: History reviewed. No pertinent surgical history. HPI:  87yo female admitted 07/19/21 with right side weakness. PMH: CKD3b, HTN, HLD, GERD. MRI - acute infarct L MCA   Assessment / Plan / Recommendation Clinical Impression  The Mini-Mental State Examination was administered. Pt scored 23/28 (writing subtests were not administered due to RUE weakness). Points were lost on attention/calculation subtest, however, pt reports she has never been good at math or spelling. Pt performed well on orientation, immediate and delayed recall and language tasks assessed. She reports she lives with her daughter and her daughter's husband. Daughter manages finances and major household responsibilities. At this time, pt appears to be at or very near her baseline level of function. She was encouraged to notify her PCP if difficulties arise upon return to normal routines. No further ST intervention recommended at this time.    SLP Assessment  SLP Recommendation/Assessment: All further Speech Language Pathology needs can be addressed in the next venue of care (if needs arise)  SLP Visit Diagnosis: Cognitive communication deficit (R41.841)    Recommendations for follow up therapy are one component of a  multi-disciplinary discharge planning process, led by the attending physician.  Recommendations may be updated based on patient status, additional functional criteria and insurance authorization.    Follow Up Recommendations  None at this time         SLP Evaluation Cognition  Overall Cognitive Status: Within Functional Limits for tasks assessed       Comprehension  Auditory Comprehension Overall Auditory Comprehension: Appears within functional limits for tasks assessed    Expression Expression Primary Mode of Expression: Verbal Verbal Expression Overall Verbal Expression: Appears within functional limits for tasks assessed Written Expression Dominant Hand: Right Written Expression: Not tested   Oral / Motor  Oral Motor/Sensory Function Overall Oral Motor/Sensory Function: Within functional limits Motor Speech Overall Motor Speech: Appears within functional limits for tasks assessed Intelligibility: Intelligible   GO                   Jolanta Cabeza B. Murvin Natal, [facility], CCC-SLP Speech Language Pathologist Office: [phone]  Leigh Aurora 07/21/2021, 10:38 AM

## 2021-07-21 NOTE — Progress Notes (Signed)
[facility] PROGRESS NOTE    Progress Note  Diane Webb  BZJ:[id number] DOB: 1927-07-18 DOA: 07/19/2021 PCP: Gordan Payment., MD     Brief Narrative:   Diane Webb is an 87 y.o. female past medical history significant of chronic kidney disease stage IIIb, essential hypertension comes into the [facility] for right-sided weakness that started on the morning of admission, throughout the day it progressively got worse came into the ED CT of the head did reveal hypodensity in the left thalamus, MRI of the brain showed an acute infarct in the left thalamus, MRA of the head and neck is pending started on aspirin   Assessment/Plan:   Acute stroke due to occlusion of left middle cerebral artery (HCC) HgbA1c 5.1, fasting lipid panel HDL > 40, LDL > 130 MRI of the brain showed an acute infarct of left thalamus, MRI showed no large vessel occlusion PT, OT, Speech consult pending speech recommended a regular diet, physical therapy was consulted recommended inpatient rehab. Continue aspirin and Plavix for 3 weeks then aspirin alone, continue Lipitor. Awaiting inpatient rehab admission.  Essential hypertension Hold antihypertensive medications allow permissive hypertension.  GERD without esophagitis Continue PPI.  Chronic kidney disease, stage 3b (HCC) With a baseline creatinine around 1.2, admission 1.5.  Continue to monitor.   DVT prophylaxis: lovenox Family Communication:none Status is: Inpatient  Remains inpatient appropriate because: Due to her severity of illness and stroke work-up.        Code Status:     Code Status Orders  (From admission, onward)           Start     Ordered   07/20/21 0455  Full code  Continuous        07/20/21 0454           Code Status History     This patient has a current code status but no historical code status.         IV Access:   Peripheral IV   Procedures and diagnostic studies:   MR ANGIO HEAD WO  CONTRAST  Result Date: 07/20/2021 CLINICAL DATA:  87 year old female with neurologic deficit. Acute left thalamic lacunar infarct on MRI. EXAM: MRA HEAD WITHOUT CONTRAST TECHNIQUE: Angiographic images of the Circle of Willis were acquired using MRA technique without intravenous contrast. COMPARISON:  Brain MRI 0133 hours today. Neck MRA today reported separately. FINDINGS: No intracranial mass effect or ventriculomegaly. Antegrade flow in the posterior circulation with patent distal vertebral arteries. The left is mildly dominant. No distal vertebral or vertebrobasilar junction stenosis. Bilateral AICA appear to be patent and may be dominant. Patent basilar artery with mild tortuosity. Patent SCA and PCA origins. Both posterior communicating arteries are present with fetal type bilateral PCA origins. Left PCA branches are within normal limits. There is a moderate stenosis of the right PCA P2 (series 1013, image 14). Distal right PCA branches are within normal limits. Antegrade flow in both ICA siphons. Mild siphon irregularity without stenosis. Bilateral ophthalmic and posterior communicating artery origins appear normal. Patent carotid termini, MCA and ACA origins. Diminutive or absent anterior communicating artery. Tortuous proximal right ACA A2 segment, with artifact felt to explain the apparent flow gap there. Left MCA M1 is patent with mild irregularity but no significant stenosis. Left MCA bifurcation and visible MCA branches are within normal limits. Right MCA M1 is patent with mild irregularity and stenosis proximal to the bifurcation (series 1003, image 3). Right MCA bifurcation is patent. But there is a severe  stenosis of the dominant posterior right MCA M2 branch leading up to a bifurcation (series 1007, image 15 and series 5, image 135). Other visible right MCA branches are within normal limits. IMPRESSION: Negative for large vessel occlusion, but positive for intracranial branch atherosclerosis:  Severe stenosis of a posterior Right MCA M2 branch. Mild stenosis of the Right MCA M1 segment. Moderate stenosis of the right PCA P2 segment. Electronically Signed   By: Odessa Fleming M.D.   On: 07/20/2021 07:28   MR ANGIO NECK WO CONTRAST  Result Date: 07/20/2021 CLINICAL DATA:  87 year old female with neurologic deficit. Acute left thalamic lacunar infarct on MRI. EXAM: MRA NECK WITHOUT CONTRAST TECHNIQUE: Angiographic images of the neck were acquired using MRA technique without intravenous contrast. Carotid stenosis measurements (when applicable) are obtained utilizing NASCET criteria, using the distal internal carotid diameter as the denominator. COMPARISON:  Brain MRI 0137 hours today. Intracranial MRA reported separately. FINDINGS: Two and 3D time-of-flight neck MRA. Antegrade flow in the bilateral cervical carotid and vertebral arteries. Evidence of a 3 vessel arch configuration. Bilateral carotids are moderately tortuous. Carotid bifurcations appear within normal limits. No evidence of hemodynamically significant carotid stenosis in the neck. Left vertebral artery appears mildly dominant and is tortuous. Antegrade flow in both vertebral arteries to the skull base without evidence of hemodynamically significant stenosis. IMPRESSION: Tortuous cervical carotid and vertebral arteries with no evidence of hemodynamically significant stenosis. Electronically Signed   By: Odessa Fleming M.D.   On: 07/20/2021 07:23   MR BRAIN WO CONTRAST  Result Date: 07/20/2021 CLINICAL DATA:  Neuro deficit, stroke suspected EXAM: MRI HEAD WITHOUT CONTRAST TECHNIQUE: Multiplanar, multiecho pulse sequences of the brain and surrounding structures were obtained without intravenous contrast. COMPARISON:  No prior MRI, correlation is made with CT head 07/19/2021. FINDINGS: Brain: Restricted diffusion in the left lateral thalamus, which correlates with the hypodensity seen on the 07/19/2021 CT head. No other foci of restricted diffusion. No  acute hemorrhage. Nose hemosiderin deposition to suggest remote hemorrhage. No hydrocephalus, extra-axial collection, mass, mass effect, or midline shift. Confluent T2 hyperintense signal in the periventricular white matter and pons, likely the sequela of severe chronic small vessel ischemic disease. Vascular: Normal flow voids. Skull and upper cervical spine: Normal marrow signal. Sinuses/Orbits: Negative.  Status post bilateral lens replacements. Other: The mastoids are well aerated. IMPRESSION: Acute infarct of the left lateral thalamus, which correlates with the hypodensity seen on the same-day CT head. Imaging results were communicated on 07/20/2021 at 2:31 am to provider [facility] via secure text paging. Electronically Signed   By: Wiliam Ke M.D.   On: 07/20/2021 02:33   ECHOCARDIOGRAM COMPLETE  Result Date: 07/20/2021    ECHOCARDIOGRAM REPORT   Patient Name:   TALULAH SCHIRMER Date of Exam: 07/20/2021 Medical Rec #:  [id number]     Height: Accession #:    [id number]    Weight: Date of Birth:  04-02-1927     BSA: Patient Age:    89 years      BP:           132/99 mmHg Patient Gender: F             HR:           90 bpm. Exam Location:  Inpatient Procedure: 2D Echo, Cardiac Doppler and Color Doppler Indications:    CVA  History:        Patient has no prior history of Echocardiogram examinations.  Signs/Symptoms:CKD; Risk Factors:Hypertension, Dyslipidemia and                 Obesity.  Sonographer:    Lavenia Atlas RDCS Referring Phys: [id number] Deno Lunger [facility] IMPRESSIONS  1. Left ventricular ejection fraction, by estimation, is 60 to 65%. The left ventricle has normal function. The left ventricle has no regional wall motion abnormalities. There is mild left ventricular hypertrophy. Left ventricular diastolic parameters are consistent with Grade I diastolic dysfunction (impaired relaxation).  2. Right ventricular systolic function is normal. The right ventricular size is normal. There is  normal pulmonary artery systolic pressure.  3. Left atrial size was mildly dilated.  4. The mitral valve is normal in structure. No evidence of mitral valve regurgitation. No evidence of mitral stenosis.  5. The aortic valve is tricuspid. There is mild calcification of the aortic valve. Aortic valve regurgitation is not visualized. No aortic stenosis is present.  6. The inferior vena cava is normal in size with greater than 50% respiratory variability, suggesting right atrial pressure of 3 mmHg. FINDINGS  Left Ventricle: Left ventricular ejection fraction, by estimation, is 60 to 65%. The left ventricle has normal function. The left ventricle has no regional wall motion abnormalities. The left ventricular internal cavity size was normal in size. There is  mild left ventricular hypertrophy. Left ventricular diastolic parameters are consistent with Grade I diastolic dysfunction (impaired relaxation). Right Ventricle: The right ventricular size is normal. No increase in right ventricular wall thickness. Right ventricular systolic function is normal. There is normal pulmonary artery systolic pressure. The tricuspid regurgitant velocity is 2.74 m/s, and  with an assumed right atrial pressure of 3 mmHg, the estimated right ventricular systolic pressure is 33.0 mmHg. Left Atrium: Left atrial size was mildly dilated. Right Atrium: Right atrial size was normal in size. Pericardium: There is no evidence of pericardial effusion. Mitral Valve: The mitral valve is normal in structure. No evidence of mitral valve regurgitation. No evidence of mitral valve stenosis. Tricuspid Valve: The tricuspid valve is normal in structure. Tricuspid valve regurgitation is trivial. No evidence of tricuspid stenosis. Aortic Valve: The aortic valve is tricuspid. There is mild calcification of the aortic valve. Aortic valve regurgitation is not visualized. No aortic stenosis is present. Pulmonic Valve: The pulmonic valve was normal in structure.  Pulmonic valve regurgitation is not visualized. No evidence of pulmonic stenosis. Aorta: The aortic root is normal in size and structure. Venous: The inferior vena cava is normal in size with greater than 50% respiratory variability, suggesting right atrial pressure of 3 mmHg. IAS/Shunts: No atrial level shunt detected by color flow Doppler.  LEFT VENTRICLE PLAX 2D LVIDd:         3.95 cm     Diastology LVIDs:         2.60 cm     LV e' medial:    4.90 cm/s LV PW:         1.25 cm     LV E/e' medial:  10.7 LV IVS:        1.10 cm     LV e' lateral:   5.98 cm/s LVOT diam:     2.30 cm     LV E/e' lateral: 8.7 LV SV:         88 LVOT Area:     4.15 cm  LV Volumes (MOD) LV vol d, MOD A4C: 67.6 ml LV vol s, MOD A4C: 29.2 ml LV SV MOD A4C:     67.6 ml  RIGHT VENTRICLE RV Basal diam:  2.80 cm RV S prime:     7.46 cm/s TAPSE (M-mode): 2.2 cm LEFT ATRIUM             RIGHT ATRIUM LA diam:        3.30 cm RA Area:     11.30 cm LA Vol (A2C):   48.7 ml RA Volume:   24.70 ml LA Vol (A4C):   25.9 ml LA Biplane Vol: 36.9 ml  AORTIC VALVE LVOT Vmax:   95.30 cm/s LVOT Vmean:  66.200 cm/s LVOT VTI:    0.211 m  AORTA Ao Root diam: 3.10 cm MITRAL VALVE               TRICUSPID VALVE MV Area (PHT): 3.50 cm    TR Peak grad:   30.0 mmHg MV Decel Time: 217 msec    TR Vmax:        274.00 cm/s MV E velocity: 52.30 cm/s MV A velocity: 77.60 cm/s  SHUNTS MV E/A ratio:  0.67        Systemic VTI:  0.21 m                            Systemic Diam: 2.30 cm Arvilla Meres MD Electronically signed by Arvilla Meres MD Signature Date/Time: 07/20/2021/8:31:16 PM    Final    CT HEAD CODE STROKE WO CONTRAST  Result Date: 07/19/2021 CLINICAL DATA:  Code stroke. EXAM: CT HEAD WITHOUT CONTRAST TECHNIQUE: Contiguous axial images were obtained from the base of the skull through the vertex without intravenous contrast. COMPARISON:  None. FINDINGS: Brain: Hypodensity in the left lateral thalamus, of indeterminate acuity, possibly acute or subacute infarct.  No acute hemorrhage, hydrocephalus, extra-axial collection, mass, mass effect, or midline shift. Periventricular white matter changes, likely the sequela of chronic small vessel ischemic disease. Vascular: No hyperdense vessel. Skull: Normal. Negative for fracture or focal lesion. Sinuses/Orbits: No acute finding. Other: The mastoids are well aerated. ASPECTS [facility] Stroke Program Early CT Score) - Ganglionic level infarction (caudate, lentiform nuclei, internal capsule, insula, M1-M3 cortex): 7 - Supraganglionic infarction (M4-M6 cortex): 3 Total score (0-10 with 10 being normal): 9 IMPRESSION: 1. Hypodensity in the left lateral thalamus, of indeterminate acuity, but possibly representing an acute or subacute infarct. 2. ASPECTS is 10 Code stroke imaging results were communicated on 07/19/2021 at 11:20 pm to provider [facility] via secure text paging. Electronically Signed   By: Wiliam Ke M.D.   On: 07/19/2021 23:21     Medical Consultants:   None.   Subjective:    Diane Webb appetite is back in a good mood this morning.  Objective:    Vitals:   07/20/21 1700 07/20/21 2021 07/20/21 2350 07/21/21 0350  BP: (!) 156/75 (!) 159/68 137/72 124/74  Pulse: 89 77 74 79  Resp: 18 17 20 18   Temp: 97.8 F (36.6 C) (!) 97.4 F (36.3 C) 98.2 F (36.8 C) 97.6 F (36.4 C)  TempSrc: Oral Oral Oral Oral  SpO2: 100% 99% 96% 95%  Weight:      Height: 4\' 6"  (1.372 m)      SpO2: 95 %  No intake or output data in the 24 hours ending 07/21/21 0843 Filed Weights   07/20/21 1645  Weight: 67.6 kg    Exam: General exam: In no acute distress. Respiratory system: Good air movement and clear to auscultation. Cardiovascular system: S1 & S2 heard, RRR. No JVD. Gastrointestinal  system: Abdomen is nondistended, soft and nontender.  Extremities: No pedal edema. Skin: No rashes, lesions or ulcers Psychiatry: Judgement and insight appear normal. Mood & affect appropriate.   Data Reviewed:     Labs: Basic Metabolic Panel: Recent Labs  Lab 07/19/21 2311 07/20/21 0218 07/20/21 0829  NA 139 138 139  K 4.6 4.5 4.2  CL 107 104 102  CO2  --  24 25  GLUCOSE 125* 104* 109*  BUN 30* 24* 23  CREATININE 1.60* 1.52* 1.34*  CALCIUM  --  9.4 9.5  MG  --   --  2.0    GFR Estimated Creatinine Clearance: 19.5 mL/min (A) (by C-G formula based on SCr of 1.34 mg/dL (H)). Liver Function Tests: Recent Labs  Lab 07/20/21 0218 07/20/21 0829  AST 19 20  ALT 15 17  ALKPHOS 56 58  BILITOT 0.5 0.7  PROT 6.5 7.1  ALBUMIN 3.5 3.9    No results for input(s): LIPASE, AMYLASE in the last 168 hours. No results for input(s): AMMONIA in the last 168 hours. Coagulation profile Recent Labs  Lab 07/20/21 0218  INR 1.1    COVID-19 Labs  No results for input(s): DDIMER, FERRITIN, LDH, CRP in the last 72 hours.  Lab Results  Component Value Date   SARSCOV2NAA NEGATIVE 07/19/2021    CBC: Recent Labs  Lab 07/19/21 2311 07/20/21 0218 07/20/21 0829  WBC  --  9.5 9.4  NEUTROABS  --  6.2 5.5  HGB 13.9 12.7 13.4  HCT 41.0 39.2 42.0  MCV  --  90.7 91.1  PLT  --  207 229    Cardiac Enzymes: No results for input(s): CKTOTAL, CKMB, CKMBINDEX, TROPONINI in the last 168 hours. BNP (last 3 results) No results for input(s): PROBNP in the last 8760 hours. CBG: No results for input(s): GLUCAP in the last 168 hours. D-Dimer: No results for input(s): DDIMER in the last 72 hours. Hgb A1c: Recent Labs    07/20/21 0829  HGBA1C 5.1   Lipid Profile: Recent Labs    07/20/21 0829  CHOL 293*  HDL 59  LDLCALC 198*  TRIG 181*  CHOLHDL 5.0   Thyroid function studies: No results for input(s): TSH, T4TOTAL, T3FREE, THYROIDAB in the last 72 hours.  Invalid input(s): FREET3 Anemia work up: No results for input(s): VITAMINB12, FOLATE, FERRITIN, TIBC, IRON, RETICCTPCT in the last 72 hours. Sepsis Labs: Recent Labs  Lab 07/20/21 0218 07/20/21 0829  WBC 9.5 9.4     Microbiology Recent Results (from the past 240 hour(s))  Resp Panel by RT-PCR (Flu A&B, Covid) Nasopharyngeal Swab     Status: None   Collection Time: 07/19/21 11:06 PM   Specimen: Nasopharyngeal Swab; Nasopharyngeal(NP) swabs in vial transport medium  Result Value Ref Range Status   SARS Coronavirus 2 by RT PCR NEGATIVE NEGATIVE Final    Comment: (NOTE) SARS-CoV-2 target nucleic acids are NOT DETECTED.  The SARS-CoV-2 RNA is generally detectable in upper respiratory specimens during the acute phase of infection. The lowest concentration of SARS-CoV-2 viral copies this assay can detect is 138 copies/mL. A negative result does not preclude SARS-Cov-2 infection and should not be used as the sole basis for treatment or other patient management decisions. A negative result may occur with  improper specimen collection/handling, submission of specimen other than nasopharyngeal swab, presence of viral mutation(s) within the areas targeted by this assay, and inadequate number of viral copies(<138 copies/mL). A negative result must be combined with clinical observations, patient history, and epidemiological  information. The expected result is Negative.  Fact Sheet for Patients:  BloggerCourse.com  Fact Sheet for Healthcare Providers:  SeriousBroker.it  This test is no t yet approved or cleared by the [place] FDA and  has been authorized for detection and/or diagnosis of SARS-CoV-2 by FDA under an Emergency Use Authorization (EUA). This EUA will remain  in effect (meaning this test can be used) for the duration of the COVID-19 declaration under Section 564(b)(1) of the Act, 21 U.S.C.section 360bbb-3(b)(1), unless the authorization is terminated  or revoked sooner.       Influenza A by PCR NEGATIVE NEGATIVE Final   Influenza B by PCR NEGATIVE NEGATIVE Final    Comment: (NOTE) The Xpert Xpress SARS-CoV-2/FLU/RSV plus assay is  intended as an aid in the diagnosis of influenza from Nasopharyngeal swab specimens and should not be used as a sole basis for treatment. Nasal washings and aspirates are unacceptable for Xpert Xpress SARS-CoV-2/FLU/RSV testing.  Fact Sheet for Patients: BloggerCourse.com  Fact Sheet for Healthcare Providers: SeriousBroker.it  This test is not yet approved or cleared by the [place] FDA and has been authorized for detection and/or diagnosis of SARS-CoV-2 by FDA under an Emergency Use Authorization (EUA). This EUA will remain in effect (meaning this test can be used) for the duration of the COVID-19 declaration under Section 564(b)(1) of the Act, 21 U.S.C. section 360bbb-3(b)(1), unless the authorization is terminated or revoked.  Performed at [facility], [address], [place], [place] [address]      Medications:     stroke: mapping our early stages of recovery book   Does not apply Once   aspirin EC  81 mg Oral Daily   atorvastatin  40 mg Oral Daily   clopidogrel  75 mg Oral Daily   enoxaparin (LOVENOX) injection  30 mg Subcutaneous Q24H   pantoprazole  40 mg Oral Daily   Continuous Infusions:    LOS: 1 day   Marinda Elk  [facility]  07/21/2021, 8:43 AM

## 2021-07-21 NOTE — Discharge Summary (Signed)
Physician Discharge Summary  Diane Webb INO:[id number] DOB: 12/13/1926 DOA: 07/19/2021  PCP: Gordan Payment., MD  Admit date: 07/19/2021 Discharge date: 07/21/2021  Admitted From: Home Disposition:  home  Recommendations for Outpatient Follow-up:  Follow up with Neurology in 1-2 weeks Please obtain BMP/CBC in one week   Home Health:yes Equipment/Devices:none  Discharge Condition:Stable CODE STATUS:Full Diet recommendation: Heart Healthy  Brief/Interim Summary: 87 y.o. female past medical history significant of chronic kidney disease stage IIIb, essential hypertension comes into the [facility] for right-sided weakness that started on the morning of admission, throughout the day it progressively got worse came into the ED CT of the head did reveal hypodensity in the left thalamus, MRI of the brain showed an acute infarct in the left thalamus, MRA of the head and neck is pending started on aspirin  Discharge Diagnoses:  Principal Problem:   Acute stroke due to occlusion of left middle cerebral artery (HCC) Active Problems:   Essential hypertension   GERD without esophagitis   Mixed hyperlipidemia   Chronic kidney disease, stage 3b (HCC)  Acute stroke due to occlusion of the left middle cerebral artery: With an A1c of 5.1, fasting lipid panel HDL greater than 40 LDL greater than 130 she was started on a statin. MRI of the brain showed acute infarct of the left thalamus, MRA of the head showed no large vessel occlusion. Physical therapy evaluated the patient recommended home health PT. She was previously not on aspirin she will be sent home on aspirin and Plavix for 3 weeks, then stop Plavix continue aspirin. 2D echo was done that showed an EF of 60% grade 1 diastolic heart failure.  Essential hypertension: Antihypertensive medications were held on admission to allow permissive hypertension, these will be resumed as an outpatient.  Chronic kidney disease stage  IIIb: Creatinine remained at baseline no changes made  Discharge Instructions  Discharge Instructions     Ambulatory referral to Neurology   Complete by: As directed    Follow up with stroke clinic NP (Jessica Vanschaick or Darrol Angel, if both not available, consider Manson Allan, or Ahern) at [facility] in about 4 weeks. Thanks.   Diet - low sodium heart healthy   Complete by: As directed    Increase activity slowly   Complete by: As directed       Allergies as of 07/21/2021       Reactions   Alendronate Diarrhea   Amoxicillin-pot Clavulanate Diarrhea   Meperidine Other (See Comments)   Syncope   Diclofenac Rash        Medication List     STOP taking these medications    multivitamin capsule       TAKE these medications    acetaminophen 650 MG CR tablet Commonly known as: TYLENOL Take 1,300 mg by mouth every 8 (eight) hours as needed for pain.   aspirin 81 MG EC tablet Take 1 tablet (81 mg total) by mouth daily. Swallow whole.   atorvastatin 40 MG tablet Commonly known as: LIPITOR Take 1 tablet (40 mg total) by mouth daily.   Biofreeze 4 % Gel Generic drug: Menthol (Topical Analgesic) Apply 1 application topically daily as needed (arthritis pain).   clopidogrel 75 MG tablet Commonly known as: PLAVIX Take 1 tablet (75 mg total) by mouth daily for 21 days.   losartan-hydrochlorothiazide 100-25 MG tablet Commonly known as: HYZAAR Take 1 tablet by mouth daily.   meclizine 25 MG tablet Commonly known as: ANTIVERT Take 25 mg by mouth  every 6 (six) hours as needed for dizziness.   omeprazole 40 MG capsule Commonly known as: PRILOSEC Take 40 mg by mouth daily.   potassium chloride 10 MEQ tablet Commonly known as: KLOR-CON Take 10 mEq by mouth daily.   psyllium 0.52 g capsule Commonly known as: REGULOID Take 0.52 g by mouth 2 (two) times daily.        Follow-up Information     [facility]. Schedule an appointment as soon  as possible for a visit in 1 month(s).   Specialty: Neurology Why: stroke clinic Contact information: [address] [place] [address] [phone]               Allergies  Allergen Reactions   Alendronate Diarrhea   Amoxicillin-Pot Clavulanate Diarrhea   Meperidine Other (See Comments)    Syncope   Diclofenac Rash    Consultations: Neurology   Procedures/Studies: MR ANGIO HEAD WO CONTRAST  Result Date: 07/20/2021 CLINICAL DATA:  87 year old female with neurologic deficit. Acute left thalamic lacunar infarct on MRI. EXAM: MRA HEAD WITHOUT CONTRAST TECHNIQUE: Angiographic images of the Circle of Willis were acquired using MRA technique without intravenous contrast. COMPARISON:  Brain MRI 0133 hours today. Neck MRA today reported separately. FINDINGS: No intracranial mass effect or ventriculomegaly. Antegrade flow in the posterior circulation with patent distal vertebral arteries. The left is mildly dominant. No distal vertebral or vertebrobasilar junction stenosis. Bilateral AICA appear to be patent and may be dominant. Patent basilar artery with mild tortuosity. Patent SCA and PCA origins. Both posterior communicating arteries are present with fetal type bilateral PCA origins. Left PCA branches are within normal limits. There is a moderate stenosis of the right PCA P2 (series 1013, image 14). Distal right PCA branches are within normal limits. Antegrade flow in both ICA siphons. Mild siphon irregularity without stenosis. Bilateral ophthalmic and posterior communicating artery origins appear normal. Patent carotid termini, MCA and ACA origins. Diminutive or absent anterior communicating artery. Tortuous proximal right ACA A2 segment, with artifact felt to explain the apparent flow gap there. Left MCA M1 is patent with mild irregularity but no significant stenosis. Left MCA bifurcation and visible MCA branches are within normal limits. Right MCA M1 is patent  with mild irregularity and stenosis proximal to the bifurcation (series 1003, image 3). Right MCA bifurcation is patent. But there is a severe stenosis of the dominant posterior right MCA M2 branch leading up to a bifurcation (series 1007, image 15 and series 5, image 135). Other visible right MCA branches are within normal limits. IMPRESSION: Negative for large vessel occlusion, but positive for intracranial branch atherosclerosis: Severe stenosis of a posterior Right MCA M2 branch. Mild stenosis of the Right MCA M1 segment. Moderate stenosis of the right PCA P2 segment. Electronically Signed   By: Odessa Fleming M.D.   On: 07/20/2021 07:28   MR ANGIO NECK WO CONTRAST  Result Date: 07/20/2021 CLINICAL DATA:  87 year old female with neurologic deficit. Acute left thalamic lacunar infarct on MRI. EXAM: MRA NECK WITHOUT CONTRAST TECHNIQUE: Angiographic images of the neck were acquired using MRA technique without intravenous contrast. Carotid stenosis measurements (when applicable) are obtained utilizing NASCET criteria, using the distal internal carotid diameter as the denominator. COMPARISON:  Brain MRI 0137 hours today. Intracranial MRA reported separately. FINDINGS: Two and 3D time-of-flight neck MRA. Antegrade flow in the bilateral cervical carotid and vertebral arteries. Evidence of a 3 vessel arch configuration. Bilateral carotids are moderately tortuous. Carotid bifurcations appear within normal  limits. No evidence of hemodynamically significant carotid stenosis in the neck. Left vertebral artery appears mildly dominant and is tortuous. Antegrade flow in both vertebral arteries to the skull base without evidence of hemodynamically significant stenosis. IMPRESSION: Tortuous cervical carotid and vertebral arteries with no evidence of hemodynamically significant stenosis. Electronically Signed   By: Odessa Fleming M.D.   On: 07/20/2021 07:23   MR BRAIN WO CONTRAST  Result Date: 07/20/2021 CLINICAL DATA:  Neuro  deficit, stroke suspected EXAM: MRI HEAD WITHOUT CONTRAST TECHNIQUE: Multiplanar, multiecho pulse sequences of the brain and surrounding structures were obtained without intravenous contrast. COMPARISON:  No prior MRI, correlation is made with CT head 07/19/2021. FINDINGS: Brain: Restricted diffusion in the left lateral thalamus, which correlates with the hypodensity seen on the 07/19/2021 CT head. No other foci of restricted diffusion. No acute hemorrhage. Nose hemosiderin deposition to suggest remote hemorrhage. No hydrocephalus, extra-axial collection, mass, mass effect, or midline shift. Confluent T2 hyperintense signal in the periventricular white matter and pons, likely the sequela of severe chronic small vessel ischemic disease. Vascular: Normal flow voids. Skull and upper cervical spine: Normal marrow signal. Sinuses/Orbits: Negative.  Status post bilateral lens replacements. Other: The mastoids are well aerated. IMPRESSION: Acute infarct of the left lateral thalamus, which correlates with the hypodensity seen on the same-day CT head. Imaging results were communicated on 07/20/2021 at 2:31 am to provider [facility] via secure text paging. Electronically Signed   By: Wiliam Ke M.D.   On: 07/20/2021 02:33   ECHOCARDIOGRAM COMPLETE  Result Date: 07/20/2021    ECHOCARDIOGRAM REPORT   Patient Name:   Diane Webb Date of Exam: 07/20/2021 Medical Rec #:  [id number]     Height: Accession #:    [id number]    Weight: Date of Birth:  1927/04/13     BSA: Patient Age:    87 years      BP:           132/99 mmHg Patient Gender: F             HR:           90 bpm. Exam Location:  Inpatient Procedure: 2D Echo, Cardiac Doppler and Color Doppler Indications:    CVA  History:        Patient has no prior history of Echocardiogram examinations.                 Signs/Symptoms:CKD; Risk Factors:Hypertension, Dyslipidemia and                 Obesity.  Sonographer:    Lavenia Atlas RDCS Referring Phys: [id number] Deno Lunger  [facility] IMPRESSIONS  1. Left ventricular ejection fraction, by estimation, is 60 to 65%. The left ventricle has normal function. The left ventricle has no regional wall motion abnormalities. There is mild left ventricular hypertrophy. Left ventricular diastolic parameters are consistent with Grade I diastolic dysfunction (impaired relaxation).  2. Right ventricular systolic function is normal. The right ventricular size is normal. There is normal pulmonary artery systolic pressure.  3. Left atrial size was mildly dilated.  4. The mitral valve is normal in structure. No evidence of mitral valve regurgitation. No evidence of mitral stenosis.  5. The aortic valve is tricuspid. There is mild calcification of the aortic valve. Aortic valve regurgitation is not visualized. No aortic stenosis is present.  6. The inferior vena cava is normal in size with greater than 50% respiratory variability, suggesting right atrial pressure of 3 mmHg. FINDINGS  Left  Ventricle: Left ventricular ejection fraction, by estimation, is 60 to 65%. The left ventricle has normal function. The left ventricle has no regional wall motion abnormalities. The left ventricular internal cavity size was normal in size. There is  mild left ventricular hypertrophy. Left ventricular diastolic parameters are consistent with Grade I diastolic dysfunction (impaired relaxation). Right Ventricle: The right ventricular size is normal. No increase in right ventricular wall thickness. Right ventricular systolic function is normal. There is normal pulmonary artery systolic pressure. The tricuspid regurgitant velocity is 2.74 m/s, and  with an assumed right atrial pressure of 3 mmHg, the estimated right ventricular systolic pressure is 33.0 mmHg. Left Atrium: Left atrial size was mildly dilated. Right Atrium: Right atrial size was normal in size. Pericardium: There is no evidence of pericardial effusion. Mitral Valve: The mitral valve is normal in structure. No  evidence of mitral valve regurgitation. No evidence of mitral valve stenosis. Tricuspid Valve: The tricuspid valve is normal in structure. Tricuspid valve regurgitation is trivial. No evidence of tricuspid stenosis. Aortic Valve: The aortic valve is tricuspid. There is mild calcification of the aortic valve. Aortic valve regurgitation is not visualized. No aortic stenosis is present. Pulmonic Valve: The pulmonic valve was normal in structure. Pulmonic valve regurgitation is not visualized. No evidence of pulmonic stenosis. Aorta: The aortic root is normal in size and structure. Venous: The inferior vena cava is normal in size with greater than 50% respiratory variability, suggesting right atrial pressure of 3 mmHg. IAS/Shunts: No atrial level shunt detected by color flow Doppler.  LEFT VENTRICLE PLAX 2D LVIDd:         3.95 cm     Diastology LVIDs:         2.60 cm     LV e' medial:    4.90 cm/s LV PW:         1.25 cm     LV E/e' medial:  10.7 LV IVS:        1.10 cm     LV e' lateral:   5.98 cm/s LVOT diam:     2.30 cm     LV E/e' lateral: 8.7 LV SV:         88 LVOT Area:     4.15 cm  LV Volumes (MOD) LV vol d, MOD A4C: 67.6 ml LV vol s, MOD A4C: 29.2 ml LV SV MOD A4C:     67.6 ml RIGHT VENTRICLE RV Basal diam:  2.80 cm RV S prime:     7.46 cm/s TAPSE (M-mode): 2.2 cm LEFT ATRIUM             RIGHT ATRIUM LA diam:        3.30 cm RA Area:     11.30 cm LA Vol (A2C):   48.7 ml RA Volume:   24.70 ml LA Vol (A4C):   25.9 ml LA Biplane Vol: 36.9 ml  AORTIC VALVE LVOT Vmax:   95.30 cm/s LVOT Vmean:  66.200 cm/s LVOT VTI:    0.211 m  AORTA Ao Root diam: 3.10 cm MITRAL VALVE               TRICUSPID VALVE MV Area (PHT): 3.50 cm    TR Peak grad:   30.0 mmHg MV Decel Time: 217 msec    TR Vmax:        274.00 cm/s MV E velocity: 52.30 cm/s MV A velocity: 77.60 cm/s  SHUNTS MV E/A ratio:  0.67  Systemic VTI:  0.21 m                            Systemic Diam: 2.30 cm Arvilla Meres MD Electronically signed by Arvilla Meres MD Signature Date/Time: 07/20/2021/8:31:16 PM    Final    CT HEAD CODE STROKE WO CONTRAST  Result Date: 07/19/2021 CLINICAL DATA:  Code stroke. EXAM: CT HEAD WITHOUT CONTRAST TECHNIQUE: Contiguous axial images were obtained from the base of the skull through the vertex without intravenous contrast. COMPARISON:  None. FINDINGS: Brain: Hypodensity in the left lateral thalamus, of indeterminate acuity, possibly acute or subacute infarct. No acute hemorrhage, hydrocephalus, extra-axial collection, mass, mass effect, or midline shift. Periventricular white matter changes, likely the sequela of chronic small vessel ischemic disease. Vascular: No hyperdense vessel. Skull: Normal. Negative for fracture or focal lesion. Sinuses/Orbits: No acute finding. Other: The mastoids are well aerated. ASPECTS [facility] Stroke Program Early CT Score) - Ganglionic level infarction (caudate, lentiform nuclei, internal capsule, insula, M1-M3 cortex): 7 - Supraganglionic infarction (M4-M6 cortex): 3 Total score (0-10 with 10 being normal): 9 IMPRESSION: 1. Hypodensity in the left lateral thalamus, of indeterminate acuity, but possibly representing an acute or subacute infarct. 2. ASPECTS is 10 Code stroke imaging results were communicated on 07/19/2021 at 11:20 pm to provider [facility] via secure text paging. Electronically Signed   By: Wiliam Ke M.D.   On: 07/19/2021 23:21   (Echo, Carotid, EGD, Colonoscopy, ERCP)    Subjective: No complaints  Discharge Exam: Vitals:   07/20/21 2350 07/21/21 0350  BP: 137/72 124/74  Pulse: 74 79  Resp: 20 18  Temp: 98.2 F (36.8 C) 97.6 F (36.4 C)  SpO2: 96% 95%   Vitals:   07/20/21 1700 07/20/21 2021 07/20/21 2350 07/21/21 0350  BP: (!) 156/75 (!) 159/68 137/72 124/74  Pulse: 89 77 74 79  Resp: 18 17 20 18   Temp: 97.8 F (36.6 C) (!) 97.4 F (36.3 C) 98.2 F (36.8 C) 97.6 F (36.4 C)  TempSrc: Oral Oral Oral Oral  SpO2: 100% 99% 96% 95%  Weight:      Height:  4\' 6"  (1.372 m)       General: Pt is alert, awake, not in acute distress Cardiovascular: RRR, S1/S2 +, no rubs, no gallops Respiratory: CTA bilaterally, no wheezing, no rhonchi Abdominal: Soft, NT, ND, bowel sounds + Extremities: no edema, no cyanosis    The results of significant diagnostics from this hospitalization (including imaging, microbiology, ancillary and laboratory) are listed below for reference.     Microbiology: Recent Results (from the past 240 hour(s))  Resp Panel by RT-PCR (Flu A&B, Covid) Nasopharyngeal Swab     Status: None   Collection Time: 07/19/21 11:06 PM   Specimen: Nasopharyngeal Swab; Nasopharyngeal(NP) swabs in vial transport medium  Result Value Ref Range Status   SARS Coronavirus 2 by RT PCR NEGATIVE NEGATIVE Final    Comment: (NOTE) SARS-CoV-2 target nucleic acids are NOT DETECTED.  The SARS-CoV-2 RNA is generally detectable in upper respiratory specimens during the acute phase of infection. The lowest concentration of SARS-CoV-2 viral copies this assay can detect is 138 copies/mL. A negative result does not preclude SARS-Cov-2 infection and should not be used as the sole basis for treatment or other patient management decisions. A negative result may occur with  improper specimen collection/handling, submission of specimen other than nasopharyngeal swab, presence of viral mutation(s) within the areas targeted by this assay, and inadequate  number of viral copies(<138 copies/mL). A negative result must be combined with clinical observations, patient history, and epidemiological information. The expected result is Negative.  Fact Sheet for Patients:  BloggerCourse.com  Fact Sheet for Healthcare Providers:  SeriousBroker.it  This test is no t yet approved or cleared by the [place] FDA and  has been authorized for detection and/or diagnosis of SARS-CoV-2 by FDA under an Emergency Use  Authorization (EUA). This EUA will remain  in effect (meaning this test can be used) for the duration of the COVID-19 declaration under Section 564(b)(1) of the Act, 21 U.S.C.section 360bbb-3(b)(1), unless the authorization is terminated  or revoked sooner.       Influenza A by PCR NEGATIVE NEGATIVE Final   Influenza B by PCR NEGATIVE NEGATIVE Final    Comment: (NOTE) The Xpert Xpress SARS-CoV-2/FLU/RSV plus assay is intended as an aid in the diagnosis of influenza from Nasopharyngeal swab specimens and should not be used as a sole basis for treatment. Nasal washings and aspirates are unacceptable for Xpert Xpress SARS-CoV-2/FLU/RSV testing.  Fact Sheet for Patients: BloggerCourse.com  Fact Sheet for Healthcare Providers: SeriousBroker.it  This test is not yet approved or cleared by the [place] FDA and has been authorized for detection and/or diagnosis of SARS-CoV-2 by FDA under an Emergency Use Authorization (EUA). This EUA will remain in effect (meaning this test can be used) for the duration of the COVID-19 declaration under Section 564(b)(1) of the Act, 21 U.S.C. section 360bbb-3(b)(1), unless the authorization is terminated or revoked.  Performed at [facility], [address] [place], [place] [address]      Labs: BNP (last 3 results) No results for input(s): BNP in the last 8760 hours. Basic Metabolic Panel: Recent Labs  Lab 07/19/21 2311 07/20/21 0218 07/20/21 0829  NA 139 138 139  K 4.6 4.5 4.2  CL 107 104 102  CO2  --  24 25  GLUCOSE 125* 104* 109*  BUN 30* 24* 23  CREATININE 1.60* 1.52* 1.34*  CALCIUM  --  9.4 9.5  MG  --   --  2.0   Liver Function Tests: Recent Labs  Lab 07/20/21 0218 07/20/21 0829  AST 19 20  ALT 15 17  ALKPHOS 56 58  BILITOT 0.5 0.7  PROT 6.5 7.1  ALBUMIN 3.5 3.9   No results for input(s): LIPASE, AMYLASE in the last 168 hours. No results for input(s):  AMMONIA in the last 168 hours. CBC: Recent Labs  Lab 07/19/21 2311 07/20/21 0218 07/20/21 0829  WBC  --  9.5 9.4  NEUTROABS  --  6.2 5.5  HGB 13.9 12.7 13.4  HCT 41.0 39.2 42.0  MCV  --  90.7 91.1  PLT  --  207 229   Cardiac Enzymes: No results for input(s): CKTOTAL, CKMB, CKMBINDEX, TROPONINI in the last 168 hours. BNP: Invalid input(s): POCBNP CBG: No results for input(s): GLUCAP in the last 168 hours. D-Dimer No results for input(s): DDIMER in the last 72 hours. Hgb A1c Recent Labs    07/20/21 0829  HGBA1C 5.1   Lipid Profile Recent Labs    07/20/21 0829  CHOL 293*  HDL 59  LDLCALC 198*  TRIG 181*  CHOLHDL 5.0   Thyroid function studies No results for input(s): TSH, T4TOTAL, T3FREE, THYROIDAB in the last 72 hours.  Invalid input(s): FREET3 Anemia work up No results for input(s): VITAMINB12, FOLATE, FERRITIN, TIBC, IRON, RETICCTPCT in the last 72 hours. Urinalysis    Component Value Date/Time  COLORURINE YELLOW 07/20/2021 1040   APPEARANCEUR CLEAR 07/20/2021 1040   LABSPEC 1.016 07/20/2021 1040   PHURINE 6.0 07/20/2021 1040   GLUCOSEU NEGATIVE 07/20/2021 1040   HGBUR NEGATIVE 07/20/2021 1040   BILIRUBINUR NEGATIVE 07/20/2021 1040   KETONESUR NEGATIVE 07/20/2021 1040   PROTEINUR NEGATIVE 07/20/2021 1040   NITRITE NEGATIVE 07/20/2021 1040   LEUKOCYTESUR MODERATE (A) 07/20/2021 1040   Sepsis Labs Invalid input(s): PROCALCITONIN,  WBC,  LACTICIDVEN Microbiology Recent Results (from the past 240 hour(s))  Resp Panel by RT-PCR (Flu A&B, Covid) Nasopharyngeal Swab     Status: None   Collection Time: 07/19/21 11:06 PM   Specimen: Nasopharyngeal Swab; Nasopharyngeal(NP) swabs in vial transport medium  Result Value Ref Range Status   SARS Coronavirus 2 by RT PCR NEGATIVE NEGATIVE Final    Comment: (NOTE) SARS-CoV-2 target nucleic acids are NOT DETECTED.  The SARS-CoV-2 RNA is generally detectable in upper respiratory specimens during the acute phase of  infection. The lowest concentration of SARS-CoV-2 viral copies this assay can detect is 138 copies/mL. A negative result does not preclude SARS-Cov-2 infection and should not be used as the sole basis for treatment or other patient management decisions. A negative result may occur with  improper specimen collection/handling, submission of specimen other than nasopharyngeal swab, presence of viral mutation(s) within the areas targeted by this assay, and inadequate number of viral copies(<138 copies/mL). A negative result must be combined with clinical observations, patient history, and epidemiological information. The expected result is Negative.  Fact Sheet for Patients:  BloggerCourse.com  Fact Sheet for Healthcare Providers:  SeriousBroker.it  This test is no t yet approved or cleared by the [place] FDA and  has been authorized for detection and/or diagnosis of SARS-CoV-2 by FDA under an Emergency Use Authorization (EUA). This EUA will remain  in effect (meaning this test can be used) for the duration of the COVID-19 declaration under Section 564(b)(1) of the Act, 21 U.S.C.section 360bbb-3(b)(1), unless the authorization is terminated  or revoked sooner.       Influenza A by PCR NEGATIVE NEGATIVE Final   Influenza B by PCR NEGATIVE NEGATIVE Final    Comment: (NOTE) The Xpert Xpress SARS-CoV-2/FLU/RSV plus assay is intended as an aid in the diagnosis of influenza from Nasopharyngeal swab specimens and should not be used as a sole basis for treatment. Nasal washings and aspirates are unacceptable for Xpert Xpress SARS-CoV-2/FLU/RSV testing.  Fact Sheet for Patients: BloggerCourse.com  Fact Sheet for Healthcare Providers: SeriousBroker.it  This test is not yet approved or cleared by the [place] FDA and has been authorized for detection and/or diagnosis of SARS-CoV-2  by FDA under an Emergency Use Authorization (EUA). This EUA will remain in effect (meaning this test can be used) for the duration of the COVID-19 declaration under Section 564(b)(1) of the Act, 21 U.S.C. section 360bbb-3(b)(1), unless the authorization is terminated or revoked.  Performed at [facility], [address] [place], [place] [address]       SIGNED:   Marinda Elk, MD  [facility] 07/21/2021, 8:51 AM Pager   If 7PM-7AM, please contact night-coverage [email or web address] Password [email or web address]

## 2021-07-21 NOTE — Evaluation (Signed)
Physical Therapy Evaluation  Patient Details Name: Diane Webb MRN: [id number] DOB: 04-27-1927 Today's Date: 07/21/2021  History of Present Illness  Pt is a 87 y/o F who presents to the [facility] via [facility] on 10/19 due to R sided weakness. MRI showed an acute infarct in the L thalamus. PMH significant for CKD3, HTN, and HLD.   Clinical Impression  Pt admitted with above diagnosis. At the time of PT eval pt was able to perform transfers with heavy mod assist from elevated surface, and ambulation with max-total assist to prevent fall when R knee buckled during transition to chair. It appears pt was functioning at a modified independent level PTA with rollator. She was able to perform ADL's without assistance, however daughter was assisting with IADL's. Pt currently with functional limitations due to the deficits listed below (see PT Problem List). Feel this patient would be an excellent CIR candidate as she is motivated to participate and return to her PLOF. Acutely, pt will benefit from skilled PT to increase their independence and safety with mobility to allow discharge to the venue listed below.          Recommendations for follow up therapy are one component of a multi-disciplinary discharge planning process, led by the attending physician.  Recommendations may be updated based on patient status, additional functional criteria and insurance authorization.  Follow Up Recommendations CIR;Supervision/Assistance - 24 hour    Equipment Recommendations  Other (comment) (TBD by next venue of care)    Recommendations for Other Services Rehab consult     Precautions / Restrictions Precautions Precautions: Fall Restrictions Weight Bearing Restrictions: No      Mobility  Bed Mobility Overal bed mobility: Needs Assistance Bed Mobility: Supine to Sit     Supine to sit: Min assist Sit to supine: Min guard   General bed mobility comments: Assist and increased time and effort to scoot  out fully to EOB. Several lateral losses of balance to the R when attempting to scoot out.    Transfers Overall transfer level: Needs assistance Equipment used: Rolling walker (2 wheeled) Transfers: Sit to/from Stand Sit to Stand: Mod assist;From elevated surface         General transfer comment: Heavy mod assist to power up from raised EOB to RW. VC's for R hand on walker and to push up to stand with LUE on bed.  Ambulation/Gait Ambulation/Gait assistance: Mod assist;Max assist Gait Distance (Feet): 5 Feet Assistive device: Rolling walker (2 wheeled) Gait Pattern/deviations: Step-to pattern;Decreased stride length;Trunk flexed;Decreased weight shift to right Gait velocity: Decreased Gait velocity interpretation: <1.31 ft/sec, indicative of household ambulator General Gait Details: Mod assist initially for balance and walker management. When pt began backing up to the chair, RLE knee buckled and max to total assist was required to prevent fall and return pt to standing. Sudden, uncontrolled descent to the chair when pt felt it on the back of her legs.  Stairs            Wheelchair Mobility    Modified Rankin (Stroke Patients Only) Modified Rankin (Stroke Patients Only) Pre-Morbid Rankin Score: No significant disability Modified Rankin: Moderately severe disability     Balance Overall balance assessment: Needs assistance Sitting-balance support: Feet supported;No upper extremity supported Sitting balance-Leahy Scale: Poor Sitting balance - Comments: R lateral lean. Unable to maintain sitting posture with challenge of MMT LE's. Postural control: Right lateral lean;Posterior lean Standing balance support: Bilateral upper extremity supported Standing balance-Leahy Scale: Zero Standing balance comment: Up to  total assist for balance support and to prevent fall.                             Pertinent Vitals/Pain Pain Assessment: No/denies pain    Home Living  Family/patient expects to be discharged to:: Private residence Living Arrangements: Children Available Help at Discharge: Available 24 hours/day;Family Type of Home: House Home Access: Level entry     Home Layout: One level Home Equipment: Environmental consultant - 4 wheels;Shower seat      Prior Function Level of Independence: Independent with assistive device(s)         Comments: Reports that her daughter assists with IADL's, however she is able to do all ADL's on her own.     Hand Dominance   Dominant Hand: Right    Extremity/Trunk Assessment   Upper Extremity Assessment Upper Extremity Assessment: RUE deficits/detail RUE Deficits / Details: Pt presenting with decreased coordination, numbness, and weakness in RUE. RUE Sensation: decreased light touch;decreased proprioception (Decreased light touch in hand only) RUE Coordination: decreased fine motor    Lower Extremity Assessment Lower Extremity Assessment: RLE deficits/detail RLE Deficits / Details: Decreased strength, coordination, and sensation differences in LE. Pt reports decreased light touch on medial thigh and lateral lower leg. RLE Sensation: decreased light touch;decreased proprioception RLE Coordination: decreased gross motor;decreased fine motor    Cervical / Trunk Assessment Cervical / Trunk Assessment: Kyphotic  Communication   Communication: No difficulties  Cognition Arousal/Alertness: Awake/alert Behavior During Therapy: WFL for tasks assessed/performed Overall Cognitive Status: Within Functional Limits for tasks assessed                                 General Comments: Pt very aware that she is too weak and unsafe to go home      General Comments General comments (skin integrity, edema, etc.): Max HR seen in standing 141 BPM, BP 169/96    Exercises     Assessment/Plan    PT Assessment Patient needs continued PT services  PT Problem List Decreased strength;Decreased range of  motion;Decreased activity tolerance;Decreased balance;Decreased mobility;Decreased coordination;Decreased knowledge of use of DME;Decreased safety awareness;Decreased knowledge of precautions;Impaired sensation       PT Treatment Interventions DME instruction;Gait training;Stair training;Functional mobility training;Therapeutic activities;Therapeutic exercise;Neuromuscular re-education;Patient/family education;Wheelchair mobility training;Balance training    PT Goals (Current goals can be found in the Care Plan section)  Acute Rehab PT Goals Patient Stated Goal: Be able to return home PT Goal Formulation: With patient/family Time For Goal Achievement: 07/28/21 Potential to Achieve Goals: Good    Frequency Min 4X/week   Barriers to discharge        Co-evaluation               AM-PAC PT "6 Clicks" Mobility  Outcome Measure Help needed turning from your back to your side while in a flat bed without using bedrails?: A Little Help needed moving from lying on your back to sitting on the side of a flat bed without using bedrails?: A Little Help needed moving to and from a bed to a chair (including a wheelchair)?: Total Help needed standing up from a chair using your arms (e.g., wheelchair or bedside chair)?: A Lot Help needed to walk in hospital room?: Total Help needed climbing 3-5 steps with a railing? : Total 6 Click Score: 11    End of Session Equipment Utilized During Treatment:  Gait belt Activity Tolerance: Patient tolerated treatment well Patient left: in chair;with call bell/phone within reach;with chair alarm set Nurse Communication: Mobility status (Stedy for back to bed) PT Visit Diagnosis: Hemiplegia and hemiparesis Hemiplegia - Right/Left: Right Hemiplegia - dominant/non-dominant: Dominant Hemiplegia - caused by: Cerebral infarction    Time: 1151-1229 PT Time Calculation (min) (ACUTE ONLY): 38 min   Charges:   PT Evaluation $PT Eval Moderate Complexity: 1  Mod PT Treatments $Gait Training: 23-37 mins        Conni Slipper, PT, DPT Acute Rehabilitation Services Pager: [phone] Office: [phone]   Marylynn Pearson 07/21/2021, 2:16 PM

## 2021-07-21 NOTE — Progress Notes (Signed)
? ?  Inpatient Rehab Admissions Coordinator : ? ?Per therapy recommendations, patient was screened for CIR candidacy by Serria Sloma RN MSN.  At this time patient appears to be a potential candidate for CIR. I will place a rehab consult per protocol for full assessment. Please call me with any questions. ? ?Lenise Jr RN MSN ?Admissions Coordinator ?[phone] ?  ?

## 2021-07-21 NOTE — Plan of Care (Signed)
OOB with mod assist.

## 2021-07-21 NOTE — Plan of Care (Signed)
  Problem: Education: Goal: Knowledge of General Education information will improve Description: Including pain rating scale, medication(s)/side effects and non-pharmacologic comfort measures Outcome: Adequate for Discharge   Problem: Health Behavior/Discharge Planning: Goal: Ability to manage health-related needs will improve Outcome: Adequate for Discharge   Problem: Clinical Measurements: Goal: Ability to maintain clinical measurements within normal limits will improve Outcome: Adequate for Discharge Goal: Will remain free from infection Outcome: Adequate for Discharge Goal: Diagnostic test results will improve Outcome: Adequate for Discharge   

## 2021-07-21 NOTE — TOC Initial Note (Signed)
Transition of Care [facility]) - Initial/Assessment Note    Patient Details  Name: Diane Webb MRN: [id number] Date of Birth: [birth year]  Transition of [facility]) CM/SW Contact:    Kermit Balo, RN Phone Number: 07/21/2021, 2:00 PM  Clinical Narrative:                 Patient is from home with her daughter that is with her most of the time. She denies issues with transportation as her daughter drives. She denies issues with home medications. Recommendations are for CIR. CM following.  Expected Discharge Plan: IP Rehab Facility Barriers to Discharge: Continued Medical Work up   Patient Goals and CMS Choice   CMS Medicare.gov Compare Post Acute Care list provided to:: Patient Choice offered to / list presented to : Patient, Adult Children  Expected Discharge Plan and Services Expected Discharge Plan: IP Rehab Facility   Discharge Planning Services: CM Consult Post Acute Care Choice: IP Rehab Living arrangements for the past 2 months: Single Family Home Expected Discharge Date: 07/21/21                                    Prior Living Arrangements/Services Living arrangements for the past 2 months: Single Family Home Lives with:: Adult Children (daughter) Patient language and need for interpreter reviewed:: Yes Do you feel safe going back to the place where you live?: Yes      Need for Family Participation in Patient Care: Yes (Comment)   Current home services: DME (bars at commode/ shower seat/ rollator) Criminal Activity/Legal Involvement Pertinent to Current Situation/Hospitalization: No - Comment as needed  Activities of Daily Living Home Assistive Devices/Equipment: Cane (specify quad or straight), Walker (specify type) ADL Screening (condition at time of admission) Patient's cognitive ability adequate to safely complete daily activities?: Yes Is the patient deaf or have difficulty hearing?: Yes Does the patient have difficulty seeing, even when wearing  glasses/contacts?: No Does the patient have difficulty concentrating, remembering, or making decisions?: No Patient able to express need for assistance with ADLs?: Yes Does the patient have difficulty dressing or bathing?: No Independently performs ADLs?: No Communication: Independent Dressing (OT): Needs assistance Is this a change from baseline?: Change from baseline, expected to last >3 days Grooming: Needs assistance Is this a change from baseline?: Change from baseline, expected to last >3 days Feeding: Independent Bathing: Needs assistance Is this a change from baseline?: Change from baseline, expected to last >3 days Toileting: Independent with device (comment) In/Out Bed: Needs assistance Is this a change from baseline?: Change from baseline, expected to last >3 days Walks in Home: Independent with device (comment) (Uses a walker and cane at home) Does the patient have difficulty walking or climbing stairs?: Yes Weakness of Legs: Right Weakness of Arms/Hands: Right  Permission Sought/Granted                  Emotional Assessment Appearance:: Appears stated age Attitude/Demeanor/Rapport: Engaged Affect (typically observed): Accepting Orientation: : Oriented to Self, Oriented to Place, Oriented to  Time, Oriented to Situation   Psych Involvement: No (comment)  Admission diagnosis:  Stroke [facility]) [I63.9] Cerebrovascular accident (CVA), unspecified mechanism (HCC) [I63.9] Patient Active Problem List   Diagnosis Date Noted   Acute stroke due to occlusion of left middle cerebral artery (HCC) 07/20/2021   Chronic kidney disease, stage 3b (HCC) 07/20/2021   GERD without esophagitis 04/16/2016   Essential hypertension 12/01/2015  Mixed hyperlipidemia 12/01/2015   PCP:  Gordan Payment., MD Pharmacy:   [facility] [phone] - [address] [place] - [address] AT [facility] [address] [place] [address] Phone: [phone] Fax:  [phone]  [facility] [address] [place] [address] Phone: [phone] Fax: [phone]     Social Determinants of Health (SDOH) Interventions    Readmission Risk Interventions No flowsheet data found.

## 2021-07-21 NOTE — Progress Notes (Signed)
SLP Cancellation Note  Patient Details Name: Diane Webb MRN: [id number] DOB: October 23, 1926   Cancelled treatment:       Reason Eval/Treat Not Completed: Patient unavailable. Will continue efforts.  Roark Rufo B. Murvin Natal, [facility], CCC-SLP Speech Language Pathologist Office: [phone]  Leigh Aurora 07/21/2021, 9:45 AM

## 2021-07-22 NOTE — Progress Notes (Signed)
Inpatient Rehab Admissions:   Inpatient Rehab Consult received.  I met with patient and daughter at the bedside for rehabilitation assessment and to discuss goals and expectations of an inpatient rehab admission.  Both acknowledged understanding of CIR goals and expectations. Both interested in pt pursuing CIR.  Will continue to follow.   Signed: Gayland Curry, [place], [place] Admissions Coordinator [phone]

## 2021-07-22 NOTE — PMR Pre-admission (Signed)
PMR Admission Coordinator Pre-Admission Assessment  Patient: Diane Webb is an 87 y.o., female MRN: [id number] DOB: Apr 19, 1927 Height: _0  (137.2 cm) Weight: 67.6 kg  Insurance Information HMO:     PPO:      PCP:     IPA:      80/20: yes     OTHER:  PRIMARY: Medicare A & B      Policy#: [id number]      Subscriber: patient CM Name:       Phone#:      Fax#:  Pre-Cert#:       Employer:  Benefits:  Phone #: verified eligibility via [place] on 07/22/21     Name:  Eff. Date: Part A & B effective 11/29/93     Deduct: $1,556      Out of Pocket Max: NA      Life Max: NA CIR: 100% coverage      SNF: 100% days 1-20, 80% days 21-100 Outpatient: 80%     Co-Pay: 20% Home Health: 100%      Co-Pay:  DME: 80%     Co-Pay: 20% Providers: pt's choice SECONDARY:       Policy#:      Phone#:   Development worker, community:       Phone#:   The Therapist, art Information Summary" for patients in Inpatient Rehabilitation Facilities with attached "Privacy Act [place] Records" was provided and verbally reviewed with: Patient and Family  Emergency Contact Information Contact Information     Name Relation Home Work Kenly Daughter   [phone]       Current Medical History  Patient Admitting Diagnosis: L MCA infarct History of Present Illness: Pt is a 87 year old female with medical hx significant for: CKD 3, HTN, HLD. Pt presented to hospital on 10/19 d/t right sided weakness of arm and leg and not being able to see out of R eye. Pt was not eligible for acute intervention. CT showed hypodensity in left thalamus. MRI revealed left lateral thalamic stroke. MRA head and neck showed severe right MCA M2 stenosis, moderate right PCA P2 and mild right MCA M1 stenosis. No large vessel occlusion revealed. 2D echo shoed EF of 60%. Therapy evaluations completed and CIR recommended d/t pt's deficits in functional mobility and ability to complete ADLs independently.   Complete NIHSS  TOTAL: 3  Patient's medical record from [facility] has been reviewed by the rehabilitation admission coordinator and physician.  Past Medical History  Past Medical History:  Diagnosis Date   Chronic kidney disease, stage 3b ([place]) 07/20/2021   Essential hypertension 12/01/2015   Mixed hyperlipidemia 12/01/2015    Has the patient had major surgery during 100 days prior to admission? No  Family History   family history is not on file.  Current Medications  Current Facility-Administered Medications:     stroke: mapping our early stages of recovery book, , Does not apply, Once, Shalhoub, Sherryll Burger, MD   acetaminophen (TYLENOL) tablet 650 mg, 650 mg, Oral, Q6H PRN **OR** acetaminophen (TYLENOL) suppository 650 mg, 650 mg, Rectal, Q6H PRN, Shalhoub, Sherryll Burger, MD   aspirin EC tablet 81 mg, 81 mg, Oral, Daily, Shalhoub, Sherryll Burger, MD, 81 mg at 07/24/21 0277   atorvastatin (LIPITOR) tablet 40 mg, 40 mg, Oral, Daily, Rosalin Hawking, MD, 40 mg at 07/24/21 4128   bisacodyl (DULCOLAX) suppository 10 mg, 10 mg, Rectal, Daily PRN, Charlynne Cousins, MD, 10 mg at 07/23/21 1035   clopidogrel (PLAVIX)  tablet 75 mg, 75 mg, Oral, Daily, Shalhoub, Sherryll Burger, MD, 75 mg at 07/24/21 0833   enoxaparin (LOVENOX) injection 30 mg, 30 mg, Subcutaneous, Q24H, Shalhoub, Sherryll Burger, MD, 30 mg at 07/24/21 3545   melatonin tablet 10 mg, 10 mg, Oral, QHS PRN, Shalhoub, Sherryll Burger, MD   ondansetron (ZOFRAN) tablet 4 mg, 4 mg, Oral, Q6H PRN **OR** ondansetron (ZOFRAN) injection 4 mg, 4 mg, Intravenous, Q6H PRN, Shalhoub, Sherryll Burger, MD   pantoprazole (PROTONIX) EC tablet 40 mg, 40 mg, Oral, Daily, Shalhoub, Sherryll Burger, MD, 40 mg at 07/24/21 6256   polyethylene glycol (MIRALAX / GLYCOLAX) packet 17 g, 17 g, Oral, Daily PRN, Vernelle Emerald, MD, 17 g at 07/22/21 0930  Patients Current Diet:  Diet Order             Diet - low sodium heart healthy           Diet Heart Room service appropriate? No; Fluid consistency: Thin   Diet effective now           Diet - low sodium heart healthy                   Precautions / Restrictions Precautions Precautions: Fall Restrictions Weight Bearing Restrictions: No   Has the patient had 2 or more falls or a fall with injury in the past year? No  Prior Activity Level Limited Community (1-2x/wk): gets out of house for MD appointments  Prior Functional Level Self Care: Did the patient need help bathing, dressing, using the toilet or eating? Independent  Indoor Mobility: Did the patient need assistance with walking from room to room (with or without device)? Independent  Stairs: Did the patient need assistance with internal or external stairs (with or without device)? Needed some help  Functional Cognition: Did the patient need help planning regular tasks such as shopping or remembering to take medications? Independent  Patient Information Are you of Hispanic, Latino/a,or Spanish origin?: A. No, not of Hispanic, Latino/a, or Spanish origin What is your race?: B. Black or African American Do you need or want an interpreter to communicate with a doctor or health care staff?: 0. No  Patient's Response To:  Health Literacy and Transportation Is the patient able to respond to health literacy and transportation needs?: Yes Health Literacy - How often do you need to have someone help you when you read instructions, pamphlets, or other written material from your doctor or pharmacy?: Never In the past 12 months, has lack of transportation kept you from medical appointments or from getting medications?: No In the past 12 months, has lack of transportation kept you from meetings, work, or from getting things needed for daily living?: No  Home Assistive Devices / [place] Devices/Equipment: Radio producer (specify quad or straight), Walker (specify type) Home Equipment: Walker - 4 wheels, Shower seat  Prior Device Use: Indicate devices/aids used by the patient  prior to current illness, exacerbation or injury? Walker and cane  Current Functional Level Cognition  Overall Cognitive Status: Within Functional Limits for tasks assessed Orientation Level: Oriented X4 General Comments: Pt very aware that she is too weak and unsafe to go home    Extremity Assessment (includes Sensation/Coordination)  Upper Extremity Assessment: RUE deficits/detail RUE Deficits / Details: Pt presenting with decreased coordination, numbness, and weakness in RUE. RUE Sensation: decreased light touch, decreased proprioception (Decreased light touch in hand only) RUE Coordination: decreased fine motor  Lower Extremity Assessment: RLE deficits/detail RLE Deficits /  Details: Decreased strength, coordination, and sensation differences in LE. Pt reports decreased light touch on medial thigh and lateral lower leg. RLE Sensation: decreased light touch, decreased proprioception RLE Coordination: decreased gross motor, decreased fine motor    ADLs  Overall ADL's : Needs assistance/impaired Eating/Feeding: Set up Eating/Feeding Details (indicate cue type and reason): Pt reports that it feels a lot messier now due to having to use her LUE to assist at times when she can't keep ahold ofthe utensils Grooming: Set up, Sitting Upper Body Bathing: Min guard, Sitting Lower Body Bathing: Moderate assistance, Sitting/lateral leans, Sit to/from stand Upper Body Dressing : Min guard, Sitting Lower Body Dressing: Moderate assistance, Sitting/lateral leans, Sit to/from stand Toilet Transfer: Moderate assistance, Stand-pivot Toileting- Clothing Manipulation and Hygiene: Moderate assistance, Sitting/lateral lean, Sit to/from stand Functional mobility during ADLs: Moderate assistance, Rolling walker General ADL Comments: Pt R side is very weak, requiring her to need Increased support on the R side and to complete all ADL's that require standing and functional mobility.    Mobility  Overal bed  mobility: Needs Assistance Bed Mobility: Supine to Sit Supine to sit: Min assist Sit to supine: Min guard General bed mobility comments: Assist and increased time and effort to scoot out fully to EOB. Several lateral losses of balance to the R when attempting to scoot out.    Transfers  Overall transfer level: Needs assistance Equipment used: Rolling walker (2 wheeled) Transfers: Sit to/from Stand Sit to Stand: Mod assist, From elevated surface General transfer comment: Heavy mod assist to power up from raised EOB to RW. VC's for R hand on walker and to push up to stand with LUE on bed.    Ambulation / Gait / Stairs / Wheelchair Mobility  Ambulation/Gait Ambulation/Gait assistance: Mod assist, Max assist Gait Distance (Feet): 5 Feet Assistive device: Rolling walker (2 wheeled) Gait Pattern/deviations: Step-to pattern, Decreased stride length, Trunk flexed, Decreased weight shift to right General Gait Details: Mod assist initially for balance and walker management. When pt began backing up to the chair, RLE knee buckled and max to total assist was required to prevent fall and return pt to standing. Sudden, uncontrolled descent to the chair when pt felt it on the back of her legs. Gait velocity: Decreased Gait velocity interpretation: <1.31 ft/sec, indicative of household ambulator    Posture / Balance Dynamic Sitting Balance Sitting balance - Comments: R lateral lean. Unable to maintain sitting posture with challenge of MMT LE's. Balance Overall balance assessment: Needs assistance Sitting-balance support: Feet supported, No upper extremity supported Sitting balance-Leahy Scale: Poor Sitting balance - Comments: R lateral lean. Unable to maintain sitting posture with challenge of MMT LE's. Postural control: Right lateral lean, Posterior lean Standing balance support: Bilateral upper extremity supported Standing balance-Leahy Scale: Zero Standing balance comment: Up to total assist for  balance support and to prevent fall.    Special needs/care consideration Skin ecchymosis: arm/left   Previous Home Environment (from acute therapy documentation) Living Arrangements: Children  Lives With: Daughter Available Help at Discharge: Family, Available 24 hours/day Type of Home: House Home Layout: One level Home Access: Level entry Bathroom Shower/Tub: Multimedia programmer: Handicapped height Bathroom Accessibility: Yes How Accessible: Accessible via walker [place]: No  Discharge Living Setting Plans for Discharge Living Setting: Patient's home Type of Home at Discharge: House Discharge Home Layout: One level Discharge Home Access: Level entry Discharge Bathroom Shower/Tub: Walk-in shower Discharge [place]: Handicapped height Discharge Bathroom Accessibility: Yes How Accessible: Accessible  via walker Does the patient have any problems obtaining your medications?: No  Social/Family/Support Systems Anticipated Caregiver: Pearletha Alfred, daughter Anticipated Caregiver's Contact Information: [phone] Caregiver Availability: 24/7 Discharge Plan Discussed with Primary Caregiver: Yes Is Caregiver In Agreement with Plan?: Yes Does Caregiver/Family have Issues with Lodging/Transportation while Pt is in Rehab?: No  Goals Patient/Family Goal for Rehab: Mod I-Supervision: PT/OT Expected length of stay: 14-16 days Pt/Family Agrees to Admission and willing to participate: Yes Program Orientation Provided & Reviewed with Pt/Caregiver Including Roles  & Responsibilities: Yes  Decrease burden of Care through IP rehab admission: NA  Possible need for SNF placement upon discharge: Not anticipated  Patient Condition: I have reviewed medical records from [facility], spoken with CM, and patient and daughter. I met with patient at the bedside for inpatient rehabilitation assessment.  Patient will benefit from ongoing PT and OT, can  actively participate in 3 hours of therapy a day 5 days of the week, and can make measurable gains during the admission.  Patient will also benefit from the coordinated team approach during an Inpatient Acute Rehabilitation admission.  The patient will receive intensive therapy as well as Rehabilitation physician, nursing, social worker, and care management interventions.  Due to safety, skin/wound care, disease management, medication administration, and patient education the patient requires 24 hour a day rehabilitation nursing.  The patient is currently Min G-Max A with mobility and Min G-Mod A with basic ADLs.  Discharge setting and therapy post discharge at home with home health is anticipated.  Patient has agreed to participate in the Acute Inpatient Rehabilitation Program and will admit today.  Preadmission Screen Completed By:  Bethel Born, 07/24/2021 10:06 AM ______________________________________________________________________   Discussed status with Dr. Letta Pate on 07/24/21  at 10:08 AM and received approval for admission today.  Admission Coordinator:  Bethel Born, CCC-SLP, time 10:09 AM/Date 07/24/21    Assessment/Plan: Diagnosis:  Left thalamic infarct Does the need for close, 24 hr/day Medical supervision in concert with the patient's rehab needs make it unreasonable for this patient to be served in a less intensive setting? Yes Co-Morbidities requiring supervision/potential complications: CKD 3, HTN, HLD Due to bladder management, bowel management, safety, skin/wound care, disease management, medication administration, pain management, and patient education, does the patient require 24 hr/day rehab nursing? Yes Does the patient require coordinated care of a physician, rehab nurse, PT, OT, and SLP to address physical and functional deficits in the context of the above medical diagnosis(es)? Yes Addressing deficits in the following areas: balance, endurance,  locomotion, strength, transferring, bowel/bladder control, bathing, dressing, toileting, cognition, and psychosocial support Can the patient actively participate in an intensive therapy program of at least 3 hrs of therapy 5 days a week? Yes The potential for patient to make measurable gains while on inpatient rehab is good Anticipated functional outcomes upon discharge from inpatient rehab: supervision PT, supervision OT, n/a SLP Estimated rehab length of stay to reach the above functional goals is: 14-16d Anticipated discharge destination: Home 10. Overall Rehab/Functional Prognosis: excellent   MD Signature: Charlett Blake M.D. [place] [facility] Fellow Am Acad of Phys Med and Rehab Diplomate Am Board of Electrodiagnostic Med Fellow Am Board of Interventional Pain

## 2021-07-22 NOTE — Plan of Care (Signed)
  Problem: Clinical Measurements: Goal: Ability to maintain clinical measurements within normal limits will improve Outcome: Progressing Goal: Will remain free from infection Outcome: Progressing Goal: Diagnostic test results will improve Outcome: Progressing Goal: Respiratory complications will improve Outcome: Progressing Goal: Cardiovascular complication will be avoided Outcome: Progressing   Problem: Ischemic Stroke/TIA Tissue Perfusion: Goal: Complications of ischemic stroke/TIA will be minimized Outcome: Progressing   

## 2021-07-23 MED ORDER — BISACODYL 10 MG RE SUPP
10.0000 mg | Freq: Every day | RECTAL | Status: DC | PRN
  Administered 2021-07-23: 10 mg via RECTAL
  Filled 2021-07-23: qty 1

## 2021-07-23 NOTE — Progress Notes (Signed)
[facility] PROGRESS NOTE    Progress Note  Diane Webb  QQP:[id number] DOB: 1926/11/30 DOA: 07/19/2021 PCP: Gordan Payment., MD     Brief Narrative:   Diane Webb is an 87 y.o. female past medical history significant of chronic kidney disease stage IIIb, essential hypertension comes into the [facility] for right-sided weakness that started on the morning of admission, throughout the day it progressively got worse came into the ED CT of the head did reveal hypodensity in the left thalamus, MRI of the brain showed an acute infarct in the left thalamus, MRA of the head and neck showed no occlusion, started on aspirin   Assessment/Plan:   Acute stroke due to occlusion of left middle cerebral artery (HCC) HgbA1c 5.1, fasting lipid panel HDL > 40, LDL > 130 MRI of the brain showed an acute infarct of left thalamus, MRI showed no large vessel occlusion Physical therapy was consulted recommended inpatient rehab. Continue aspirin and Plavix for 3 weeks then aspirin alone, continue Lipitor. Awaiting inpatient rehab admission.  Essential hypertension Hold antihypertensive medications allow permissive hypertension.  GERD without esophagitis Continue PPI.  Chronic kidney disease, stage 3b (HCC) With a baseline creatinine around 1.2, admission 1.5.  Continue to monitor.   DVT prophylaxis: lovenox Family Communication:none Status is: Inpatient  Remains inpatient appropriate because: Due to her severity of illness and stroke work-up.        Code Status:     Code Status Orders  (From admission, onward)           Start     Ordered   07/20/21 0455  Full code  Continuous        07/20/21 0454           Code Status History     This patient has a current code status but no historical code status.         IV Access:   Peripheral IV   Procedures and diagnostic studies:   No results found.   Medical Consultants:   None.   Subjective:     Diane Webb has no new complaints this morning  Objective:    Vitals:   07/22/21 2018 07/23/21 0008 07/23/21 0344 07/23/21 0829  BP: 138/75 136/62 (!) 143/70 127/63  Pulse: 80 77 78 86  Resp: 20 19 18 18   Temp: 98.3 F (36.8 C) 98.1 F (36.7 C) 97.9 F (36.6 C) 98 F (36.7 C)  TempSrc: Oral Oral Oral Oral  SpO2: 94% 95% 95% 96%  Weight:      Height:       SpO2: 96 %   Intake/Output Summary (Last 24 hours) at 07/23/2021 0931 Last data filed at 07/23/2021 07/25/2021 Gross per 24 hour  Intake 320 ml  Output 250 ml  Net 70 ml   Filed Weights   07/20/21 1645  Weight: 67.6 kg    Exam: General exam: In no acute distress. Respiratory system: Good air movement and clear to auscultation. Cardiovascular system: S1 & S2 heard, RRR. No JVD. Gastrointestinal system: Abdomen is nondistended, soft and nontender.  Extremities: No pedal edema. Skin: No rashes, lesions or ulcers Psychiatry: Judgement and insight appear normal. Mood & affect appropriate.   Data Reviewed:    Labs: Basic Metabolic Panel: Recent Labs  Lab 07/19/21 2311 07/20/21 0218 07/20/21 0829  NA 139 138 139  K 4.6 4.5 4.2  CL 107 104 102  CO2  --  24 25  GLUCOSE 125* 104* 109*  BUN 30*  24* 23  CREATININE 1.60* 1.52* 1.34*  CALCIUM  --  9.4 9.5  MG  --   --  2.0    GFR Estimated Creatinine Clearance: 19.5 mL/min (A) (by C-G formula based on SCr of 1.34 mg/dL (H)). Liver Function Tests: Recent Labs  Lab 07/20/21 0218 07/20/21 0829  AST 19 20  ALT 15 17  ALKPHOS 56 58  BILITOT 0.5 0.7  PROT 6.5 7.1  ALBUMIN 3.5 3.9    No results for input(s): LIPASE, AMYLASE in the last 168 hours. No results for input(s): AMMONIA in the last 168 hours. Coagulation profile Recent Labs  Lab 07/20/21 0218  INR 1.1    COVID-19 Labs  No results for input(s): DDIMER, FERRITIN, LDH, CRP in the last 72 hours.  Lab Results  Component Value Date   SARSCOV2NAA NEGATIVE 07/19/2021    CBC: Recent  Labs  Lab 07/19/21 2311 07/20/21 0218 07/20/21 0829  WBC  --  9.5 9.4  NEUTROABS  --  6.2 5.5  HGB 13.9 12.7 13.4  HCT 41.0 39.2 42.0  MCV  --  90.7 91.1  PLT  --  207 229    Cardiac Enzymes: No results for input(s): CKTOTAL, CKMB, CKMBINDEX, TROPONINI in the last 168 hours. BNP (last 3 results) No results for input(s): PROBNP in the last 8760 hours. CBG: No results for input(s): GLUCAP in the last 168 hours. D-Dimer: No results for input(s): DDIMER in the last 72 hours. Hgb A1c: No results for input(s): HGBA1C in the last 72 hours.  Lipid Profile: No results for input(s): CHOL, HDL, LDLCALC, TRIG, CHOLHDL, LDLDIRECT in the last 72 hours.  Thyroid function studies: No results for input(s): TSH, T4TOTAL, T3FREE, THYROIDAB in the last 72 hours.  Invalid input(s): FREET3 Anemia work up: No results for input(s): VITAMINB12, FOLATE, FERRITIN, TIBC, IRON, RETICCTPCT in the last 72 hours. Sepsis Labs: Recent Labs  Lab 07/20/21 0218 07/20/21 0829  WBC 9.5 9.4    Microbiology Recent Results (from the past 240 hour(s))  Resp Panel by RT-PCR (Flu A&B, Covid) Nasopharyngeal Swab     Status: None   Collection Time: 07/19/21 11:06 PM   Specimen: Nasopharyngeal Swab; Nasopharyngeal(NP) swabs in vial transport medium  Result Value Ref Range Status   SARS Coronavirus 2 by RT PCR NEGATIVE NEGATIVE Final    Comment: (NOTE) SARS-CoV-2 target nucleic acids are NOT DETECTED.  The SARS-CoV-2 RNA is generally detectable in upper respiratory specimens during the acute phase of infection. The lowest concentration of SARS-CoV-2 viral copies this assay can detect is 138 copies/mL. A negative result does not preclude SARS-Cov-2 infection and should not be used as the sole basis for treatment or other patient management decisions. A negative result may occur with  improper specimen collection/handling, submission of specimen other than nasopharyngeal swab, presence of viral mutation(s)  within the areas targeted by this assay, and inadequate number of viral copies(<138 copies/mL). A negative result must be combined with clinical observations, patient history, and epidemiological information. The expected result is Negative.  Fact Sheet for Patients:  BloggerCourse.com  Fact Sheet for Healthcare Providers:  SeriousBroker.it  This test is no t yet approved or cleared by the [place] FDA and  has been authorized for detection and/or diagnosis of SARS-CoV-2 by FDA under an Emergency Use Authorization (EUA). This EUA will remain  in effect (meaning this test can be used) for the duration of the COVID-19 declaration under Section 564(b)(1) of the Act, 21 U.S.C.section 360bbb-3(b)(1), unless the authorization is  terminated  or revoked sooner.       Influenza A by PCR NEGATIVE NEGATIVE Final   Influenza B by PCR NEGATIVE NEGATIVE Final    Comment: (NOTE) The Xpert Xpress SARS-CoV-2/FLU/RSV plus assay is intended as an aid in the diagnosis of influenza from Nasopharyngeal swab specimens and should not be used as a sole basis for treatment. Nasal washings and aspirates are unacceptable for Xpert Xpress SARS-CoV-2/FLU/RSV testing.  Fact Sheet for Patients: BloggerCourse.com  Fact Sheet for Healthcare Providers: SeriousBroker.it  This test is not yet approved or cleared by the [place] FDA and has been authorized for detection and/or diagnosis of SARS-CoV-2 by FDA under an Emergency Use Authorization (EUA). This EUA will remain in effect (meaning this test can be used) for the duration of the COVID-19 declaration under Section 564(b)(1) of the Act, 21 U.S.C. section 360bbb-3(b)(1), unless the authorization is terminated or revoked.  Performed at [facility], [address], [place], [place] [address]      Medications:     stroke: mapping our  early stages of recovery book   Does not apply Once   aspirin EC  81 mg Oral Daily   atorvastatin  40 mg Oral Daily   clopidogrel  75 mg Oral Daily   enoxaparin (LOVENOX) injection  30 mg Subcutaneous Q24H   pantoprazole  40 mg Oral Daily   Continuous Infusions:    LOS: 3 days   Marinda Elk  [facility]  07/23/2021, 9:31 AM

## 2021-07-24 ENCOUNTER — Other Ambulatory Visit: Payer: Self-pay

## 2021-07-24 ENCOUNTER — Inpatient Hospital Stay (HOSPITAL_COMMUNITY)
Admission: RE | Admit: 2021-07-24 | Discharge: 2021-08-15 | DRG: 057 | Disposition: A | Discharge status: Home / Self Care | Payer: Medicare Other | Source: Intra-hospital | Attending: Physical Medicine and Rehabilitation | Admitting: Physical Medicine and Rehabilitation

## 2021-07-24 ENCOUNTER — Encounter (HOSPITAL_COMMUNITY): Payer: Self-pay | Admitting: Physical Medicine and Rehabilitation

## 2021-07-24 DIAGNOSIS — G4700 Insomnia, unspecified: Secondary | ICD-10-CM | POA: Diagnosis present

## 2021-07-24 DIAGNOSIS — Z6835 Body mass index (BMI) 35.0-35.9, adult: Secondary | ICD-10-CM | POA: Diagnosis not present

## 2021-07-24 DIAGNOSIS — Z79899 Other long term (current) drug therapy: Secondary | ICD-10-CM | POA: Diagnosis not present

## 2021-07-24 DIAGNOSIS — Z880 Allergy status to penicillin: Secondary | ICD-10-CM

## 2021-07-24 DIAGNOSIS — I63512 Cerebral infarction due to unspecified occlusion or stenosis of left middle cerebral artery: Secondary | ICD-10-CM | POA: Diagnosis present

## 2021-07-24 DIAGNOSIS — K5900 Constipation, unspecified: Secondary | ICD-10-CM | POA: Diagnosis present

## 2021-07-24 DIAGNOSIS — Z888 Allergy status to other drugs, medicaments and biological substances status: Secondary | ICD-10-CM | POA: Diagnosis not present

## 2021-07-24 DIAGNOSIS — R4701 Aphasia: Secondary | ICD-10-CM | POA: Diagnosis not present

## 2021-07-24 DIAGNOSIS — I69351 Hemiplegia and hemiparesis following cerebral infarction affecting right dominant side: Secondary | ICD-10-CM | POA: Diagnosis present

## 2021-07-24 DIAGNOSIS — E782 Mixed hyperlipidemia: Secondary | ICD-10-CM | POA: Diagnosis present

## 2021-07-24 DIAGNOSIS — E669 Obesity, unspecified: Secondary | ICD-10-CM | POA: Diagnosis present

## 2021-07-24 DIAGNOSIS — N1832 Chronic kidney disease, stage 3b: Secondary | ICD-10-CM | POA: Diagnosis present

## 2021-07-24 DIAGNOSIS — I959 Hypotension, unspecified: Secondary | ICD-10-CM | POA: Diagnosis not present

## 2021-07-24 DIAGNOSIS — I129 Hypertensive chronic kidney disease with stage 1 through stage 4 chronic kidney disease, or unspecified chronic kidney disease: Secondary | ICD-10-CM | POA: Diagnosis present

## 2021-07-24 DIAGNOSIS — I69393 Ataxia following cerebral infarction: Secondary | ICD-10-CM

## 2021-07-24 DIAGNOSIS — K219 Gastro-esophageal reflux disease without esophagitis: Secondary | ICD-10-CM | POA: Diagnosis present

## 2021-07-24 DIAGNOSIS — M79604 Pain in right leg: Secondary | ICD-10-CM | POA: Diagnosis present

## 2021-07-24 DIAGNOSIS — I10 Essential (primary) hypertension: Secondary | ICD-10-CM | POA: Diagnosis not present

## 2021-07-24 DIAGNOSIS — I6381 Other cerebral infarction due to occlusion or stenosis of small artery: Secondary | ICD-10-CM | POA: Diagnosis present

## 2021-07-24 MED ORDER — ACETAMINOPHEN 325 MG PO TABS: 650.0000 mg | ORAL_TABLET | Freq: Four times a day (QID) | ORAL | Status: DC | PRN

## 2021-07-24 MED ORDER — BLOOD PRESSURE CONTROL BOOK
Freq: Once | Status: AC
  Filled 2021-07-24: qty 1

## 2021-07-24 MED ORDER — ENOXAPARIN SODIUM 30 MG/0.3ML IJ SOSY
30.0000 mg | PREFILLED_SYRINGE | INTRAMUSCULAR | Status: DC
  Administered 2021-07-25 – 2021-08-09 (×16): 30 mg via SUBCUTANEOUS
  Filled 2021-07-24 (×16): qty 0.3

## 2021-07-24 MED ORDER — ENOXAPARIN SODIUM 30 MG/0.3ML IJ SOSY: 30.0000 mg | PREFILLED_SYRINGE | INTRAMUSCULAR | Status: DC

## 2021-07-24 MED ORDER — ONDANSETRON HCL 4 MG PO TABS: 4.0000 mg | ORAL_TABLET | Freq: Four times a day (QID) | ORAL | Status: DC | PRN

## 2021-07-24 MED ORDER — CLOPIDOGREL BISULFATE 75 MG PO TABS
75.0000 mg | ORAL_TABLET | Freq: Every day | ORAL | Status: DC
  Administered 2021-07-25 – 2021-08-15 (×22): 75 mg via ORAL
  Filled 2021-07-24 (×22): qty 1

## 2021-07-24 MED ORDER — MELATONIN 5 MG PO TABS: 10.0000 mg | ORAL_TABLET | Freq: Every evening | ORAL | Status: DC | PRN

## 2021-07-24 MED ORDER — POLYETHYLENE GLYCOL 3350 17 G PO PACK
17.0000 g | PACK | Freq: Every day | ORAL | Status: DC | PRN
  Administered 2021-08-04 – 2021-08-08 (×2): 17 g via ORAL
  Filled 2021-07-24 (×2): qty 1

## 2021-07-24 MED ORDER — ONDANSETRON HCL 4 MG/2ML IJ SOLN: 4.0000 mg | Freq: Four times a day (QID) | INTRAMUSCULAR | Status: DC | PRN

## 2021-07-24 MED ORDER — BISACODYL 10 MG RE SUPP: 10.0000 mg | Freq: Every day | RECTAL | Status: DC | PRN

## 2021-07-24 MED ORDER — ASPIRIN EC 81 MG PO TBEC
81.0000 mg | DELAYED_RELEASE_TABLET | Freq: Every day | ORAL | Status: DC
  Administered 2021-07-25 – 2021-08-15 (×22): 81 mg via ORAL
  Filled 2021-07-24 (×22): qty 1

## 2021-07-24 MED ORDER — PANTOPRAZOLE SODIUM 40 MG PO TBEC
40.0000 mg | DELAYED_RELEASE_TABLET | Freq: Every day | ORAL | Status: DC
  Administered 2021-07-25 – 2021-07-28 (×4): 40 mg via ORAL
  Filled 2021-07-24 (×4): qty 1

## 2021-07-24 MED ORDER — ACETAMINOPHEN 650 MG RE SUPP: 650.0000 mg | Freq: Four times a day (QID) | RECTAL | Status: DC | PRN

## 2021-07-24 MED ORDER — ATORVASTATIN CALCIUM 40 MG PO TABS
40.0000 mg | ORAL_TABLET | Freq: Every day | ORAL | Status: DC
  Administered 2021-07-25 – 2021-08-15 (×22): 40 mg via ORAL
  Filled 2021-07-24 (×22): qty 1

## 2021-07-24 NOTE — Progress Notes (Signed)
Inpatient Rehab Admissions Coordinator:  There is a bed available in CIR today. Dr. David Stall is aware and in agreement. Pt, daughter, NSG, and TOC aware.  Wolfgang Phoenix, MS, CCC-SLP Admissions Coordinator [phone]

## 2021-07-24 NOTE — Discharge Instructions (Addendum)
Inpatient Rehab Discharge Instructions  Diane Webb Discharge date and time: No discharge date for patient encounter.   Activities/Precautions/ Functional Status: Activity: As tolerated Diet: Regular Wound Care: Routine skin checks Functional status:  ___ No restrictions     ___ Walk up steps independently ___ 24/7 supervision/assistance   ___ Walk up steps with assistance ___ Intermittent supervision/assistance  ___ Bathe/dress independently ___ Walk with walker     _x__ Bathe/dress with assistance ___ Walk Independently    ___ Shower independently ___ Walk with assistance    ___ Shower with assistance ___ No alcohol     ___ Return to work/school ________  COMMUNITY REFERRALS UPON DISCHARGE:    Outpatient: PT     OT               Agency:[facility] [phone]              Appointment Date/Time: TBD  Medical Equipment/Items Ordered: Levan Hurst, Transfer Bench                                                 Agency/Supplier: [employer]   Special Instructions: No driving smoking or alcohol   My questions have been answered and I understand these instructions. I will adhere to these goals and the provided educational materials after my discharge from the hospital.  Patient/Caregiver Signature _______________________________ Date __________  Clinician Signature _______________________________________ Date __________  Please bring this form and your medication list with you to all your follow-up doctor's appointments.  STROKE/TIA DISCHARGE INSTRUCTIONS SMOKING Cigarette smoking nearly doubles your risk of having a stroke & is the single most alterable risk factor  If you smoke or have smoked in the last 12 months, you are advised to quit smoking for your health. Most of the excess cardiovascular risk related to smoking disappears within a year of stopping. Ask you doctor about anti-smoking medications [place] Quit Line: 1-800-QUIT NOW Free Smoking Cessation Classes  [phone]  CHOLESTEROL Know your levels; limit fat & cholesterol in your diet  Lipid Panel     Component Value Date/Time   CHOL 293 (H) 07/20/2021 0829   TRIG 181 (H) 07/20/2021 0829   HDL 59 07/20/2021 0829   CHOLHDL 5.0 07/20/2021 0829   VLDL 36 07/20/2021 0829   LDLCALC 198 (H) 07/20/2021 0829     Many patients benefit from treatment even if their cholesterol is at goal. Goal: Total Cholesterol (CHOL) less than 160 Goal:  Triglycerides (TRIG) less than 150 Goal:  HDL greater than 40 Goal:  LDL (LDLCALC) less than 100   BLOOD PRESSURE American Stroke Association blood pressure target is less that 120/80 mm/Hg  Your discharge blood pressure is:  BP: (!) 146/78 Monitor your blood pressure Limit your salt and alcohol intake Many individuals will require more than one medication for high blood pressure  DIABETES (A1c is a blood sugar average for last 3 months) Goal HGBA1c is under 7% (HBGA1c is blood sugar average for last 3 months)  Diabetes: No known diagnosis of diabetes    Lab Results  Component Value Date   HGBA1C 5.1 07/20/2021    Your HGBA1c can be lowered with medications, healthy diet, and exercise. Check your blood sugar as directed by your physician Call your physician if you experience unexplained or low blood sugars.  PHYSICAL ACTIVITY/REHABILITATION Goal is 30 minutes  at least 4 days per week  Activity: Increase activity slowly, Therapies: Physical Therapy: Home Health Return to work:  Activity decreases your risk of heart attack and stroke and makes your heart stronger.  It helps control your weight and blood pressure; helps you relax and can improve your mood. Participate in a regular exercise program. Talk with your doctor about the best form of exercise for you (dancing, walking, swimming, cycling).  DIET/WEIGHT Goal is to maintain a healthy weight  Your discharge diet is:  Diet Order             Diet Heart Room service appropriate? No; Fluid  consistency: Thin  Diet effective now                   liquids Your height is:  Height: 4\' 6"  (137.2 cm) Your current weight is: Weight: 67.7 kg Your Body Mass Index (BMI) is:  BMI (Calculated): 35.97 Following the type of diet specifically designed for you will help prevent another stroke. Your goal weight range is:   Your goal Body Mass Index (BMI) is 19-24. Healthy food habits can help reduce 3 risk factors for stroke:  High cholesterol, hypertension, and excess weight.  RESOURCES Stroke/Support Group:  Call [phone]   STROKE EDUCATION PROVIDED/REVIEWED AND GIVEN TO PATIENT Stroke warning signs and symptoms How to activate emergency medical system (call 911). Medications prescribed at discharge. Need for follow-up after discharge. Personal risk factors for stroke. Pneumonia vaccine given: No Flu vaccine given: No My questions have been answered, the writing is legible, and I understand these instructions.  I will adhere to these goals & educational materials that have been provided to me after my discharge from the hospital.

## 2021-07-24 NOTE — Progress Notes (Signed)
[facility] PROGRESS NOTE    Progress Note  Diane Webb  ZOX:[id number] DOB: 11/08/1926 DOA: 07/19/2021 PCP: Gordan Payment., MD     Brief Narrative:   Diane Webb is an 87 y.o. female past medical history significant of chronic kidney disease stage IIIb, essential hypertension comes into the [facility] for right-sided weakness that started on the morning of admission, throughout the day it progressively got worse came into the ED CT of the head did reveal hypodensity in the left thalamus, MRI of the brain showed an acute infarct in the left thalamus, MRA of the head and neck showed no occlusion, started on aspirin   Assessment/Plan:   Acute stroke due to occlusion of left middle cerebral artery (HCC) HgbA1c 5.1, fasting lipid panel HDL > 40, LDL > 130 MRI of the brain showed an acute infarct of left thalamus, MRI showed no large vessel occlusion Physical therapy was consulted recommended inpatient rehab. Continue aspirin and Plavix for 3 weeks then aspirin alone, continue Lipitor. Awaiting inpatient rehab admission.  Essential hypertension Hold antihypertensive medications allow permissive hypertension.  GERD without esophagitis Continue PPI.  Chronic kidney disease, stage 3b (HCC) With a baseline creatinine around 1.2, admission 1.5.  Continue to monitor.   DVT prophylaxis: lovenox Family Communication:none Status is: Inpatient  Remains inpatient appropriate because: Due to her severity of illness and stroke work-up.        Code Status:     Code Status Orders  (From admission, onward)           Start     Ordered   07/20/21 0455  Full code  Continuous        07/20/21 0454           Code Status History     This patient has a current code status but no historical code status.         IV Access:   Peripheral IV   Procedures and diagnostic studies:   No results found.   Medical Consultants:   None.   Subjective:     Dot Splinter has no new complaints.  General exam: In no acute distress. Respiratory system: Good air movement and clear to auscultation. Cardiovascular system: S1 & S2 heard, RRR. No JVD. Gastrointestinal system: Abdomen is nondistended, soft and nontender.  Extremities: No pedal edema. Skin: No rashes, lesions or ulcers Psychiatry: Judgement and insight appear normal. Mood & affect appropriate.  Objective:    Vitals:   07/23/21 1550 07/23/21 2013 07/24/21 0013 07/24/21 0414  BP: (!) 154/78 134/61 (!) 144/83 (!) 135/53  Pulse: 81 83 84 68  Resp: 19 20 20 17   Temp: 98.3 F (36.8 C) 98.4 F (36.9 C) 97.6 F (36.4 C) 97.6 F (36.4 C)  TempSrc: Oral Oral Oral Oral  SpO2: 96% 95% 96% 95%  Weight:      Height:       SpO2: 95 %   Intake/Output Summary (Last 24 hours) at 07/24/2021 0749 Last data filed at 07/24/2021 0700 Gross per 24 hour  Intake 540 ml  Output 800 ml  Net -260 ml    Filed Weights   07/20/21 1645  Weight: 67.6 kg    Exam: General exam: In no acute distress. Respiratory system: Good air movement and clear to auscultation. Cardiovascular system: S1 & S2 heard, RRR. No JVD. Gastrointestinal system: Abdomen is nondistended, soft and nontender.  Extremities: No pedal edema. Skin: No rashes, lesions or ulcers Psychiatry: Judgement and insight appear normal.  Mood & affect appropriate.   Data Reviewed:    Labs: Basic Metabolic Panel: Recent Labs  Lab 07/19/21 2311 07/20/21 0218 07/20/21 0829  NA 139 138 139  K 4.6 4.5 4.2  CL 107 104 102  CO2  --  24 25  GLUCOSE 125* 104* 109*  BUN 30* 24* 23  CREATININE 1.60* 1.52* 1.34*  CALCIUM  --  9.4 9.5  MG  --   --  2.0    GFR Estimated Creatinine Clearance: 19.5 mL/min (A) (by C-G formula based on SCr of 1.34 mg/dL (H)). Liver Function Tests: Recent Labs  Lab 07/20/21 0218 07/20/21 0829  AST 19 20  ALT 15 17  ALKPHOS 56 58  BILITOT 0.5 0.7  PROT 6.5 7.1  ALBUMIN 3.5 3.9    No  results for input(s): LIPASE, AMYLASE in the last 168 hours. No results for input(s): AMMONIA in the last 168 hours. Coagulation profile Recent Labs  Lab 07/20/21 0218  INR 1.1    COVID-19 Labs  No results for input(s): DDIMER, FERRITIN, LDH, CRP in the last 72 hours.  Lab Results  Component Value Date   SARSCOV2NAA NEGATIVE 07/19/2021    CBC: Recent Labs  Lab 07/19/21 2311 07/20/21 0218 07/20/21 0829  WBC  --  9.5 9.4  NEUTROABS  --  6.2 5.5  HGB 13.9 12.7 13.4  HCT 41.0 39.2 42.0  MCV  --  90.7 91.1  PLT  --  207 229    Cardiac Enzymes: No results for input(s): CKTOTAL, CKMB, CKMBINDEX, TROPONINI in the last 168 hours. BNP (last 3 results) No results for input(s): PROBNP in the last 8760 hours. CBG: No results for input(s): GLUCAP in the last 168 hours. D-Dimer: No results for input(s): DDIMER in the last 72 hours. Hgb A1c: No results for input(s): HGBA1C in the last 72 hours.  Lipid Profile: No results for input(s): CHOL, HDL, LDLCALC, TRIG, CHOLHDL, LDLDIRECT in the last 72 hours.  Thyroid function studies: No results for input(s): TSH, T4TOTAL, T3FREE, THYROIDAB in the last 72 hours.  Invalid input(s): FREET3 Anemia work up: No results for input(s): VITAMINB12, FOLATE, FERRITIN, TIBC, IRON, RETICCTPCT in the last 72 hours. Sepsis Labs: Recent Labs  Lab 07/20/21 0218 07/20/21 0829  WBC 9.5 9.4    Microbiology Recent Results (from the past 240 hour(s))  Resp Panel by RT-PCR (Flu A&B, Covid) Nasopharyngeal Swab     Status: None   Collection Time: 07/19/21 11:06 PM   Specimen: Nasopharyngeal Swab; Nasopharyngeal(NP) swabs in vial transport medium  Result Value Ref Range Status   SARS Coronavirus 2 by RT PCR NEGATIVE NEGATIVE Final    Comment: (NOTE) SARS-CoV-2 target nucleic acids are NOT DETECTED.  The SARS-CoV-2 RNA is generally detectable in upper respiratory specimens during the acute phase of infection. The lowest concentration of  SARS-CoV-2 viral copies this assay can detect is 138 copies/mL. A negative result does not preclude SARS-Cov-2 infection and should not be used as the sole basis for treatment or other patient management decisions. A negative result may occur with  improper specimen collection/handling, submission of specimen other than nasopharyngeal swab, presence of viral mutation(s) within the areas targeted by this assay, and inadequate number of viral copies(<138 copies/mL). A negative result must be combined with clinical observations, patient history, and epidemiological information. The expected result is Negative.  Fact Sheet for Patients:  BloggerCourse.com  Fact Sheet for Healthcare Providers:  SeriousBroker.it  This test is no t yet approved or cleared by the  [place] [employer] and  has been authorized for detection and/or diagnosis of SARS-CoV-2 by FDA under an TEFL teacher (EUA). This EUA will remain  in effect (meaning this test can be used) for the duration of the COVID-19 declaration under Section 564(b)(1) of the Act, 21 U.S.C.section 360bbb-3(b)(1), unless the authorization is terminated  or revoked sooner.       Influenza A by PCR NEGATIVE NEGATIVE Final   Influenza B by PCR NEGATIVE NEGATIVE Final    Comment: (NOTE) The Xpert Xpress SARS-CoV-2/FLU/RSV plus assay is intended as an aid in the diagnosis of influenza from Nasopharyngeal swab specimens and should not be used as a sole basis for treatment. Nasal washings and aspirates are unacceptable for Xpert Xpress SARS-CoV-2/FLU/RSV testing.  Fact Sheet for Patients: BloggerCourse.com  Fact Sheet for Healthcare Providers: SeriousBroker.it  This test is not yet approved or cleared by the [place] FDA and has been authorized for detection and/or diagnosis of SARS-CoV-2 by FDA under an Emergency Use  Authorization (EUA). This EUA will remain in effect (meaning this test can be used) for the duration of the COVID-19 declaration under Section 564(b)(1) of the Act, 21 U.S.C. section 360bbb-3(b)(1), unless the authorization is terminated or revoked.  Performed at [facility], [address], [place], [place] [address]      Medications:     stroke: mapping our early stages of recovery book   Does not apply Once   aspirin EC  81 mg Oral Daily   atorvastatin  40 mg Oral Daily   clopidogrel  75 mg Oral Daily   enoxaparin (LOVENOX) injection  30 mg Subcutaneous Q24H   pantoprazole  40 mg Oral Daily   Continuous Infusions:    LOS: 4 days   Marinda Elk  [facility]  07/24/2021, 7:49 AM

## 2021-07-24 NOTE — Progress Notes (Addendum)
Signed                                                                                                                                                                                                                                                                                                                                                                                                                                                                                                           PMR Admission Coordinator Pre-Admission Assessment   Patient: Diane Webb is an 87 y.o., female MRN: [id number] DOB: January 21, 1927 Height: 4' 6"  (137.2 cm) Weight: 67.6 kg   Insurance Information HMO:     PPO:      PCP:     IPA:      80/20: yes     OTHER:  PRIMARY: Medicare A & B      Policy#: [id number]      Subscriber: patient CM Name:       Phone#:      Fax#:  Pre-Cert#:       Employer:  Benefits:  Phone #:  verified eligibility via [employer] on 07/22/21     Name:  Eff. Date: Part A & B effective 11/29/93     Deduct: $1,556      Out of Pocket Max: NA      Life Max: NA CIR: 100% coverage      SNF: 100% days 1-20, 80% days 21-100 Outpatient: 80%     Co-Pay: 20% Home Health: 100%      Co-Pay:  DME: 80%     Co-Pay: 20% Providers: pt's choice SECONDARY:    [employer]   Policy#:    [id number]  Phone#:[phone]    Financial Counselor:       Phone#:    The "Data Collection Information Summary" for patients in Inpatient Rehabilitation Facilities with attached "Privacy Act [place] Records" was provided and verbally reviewed with: Patient and Family   Emergency Contact Information Contact Information       Name Relation Home Work Neal Daughter     [phone]           Current Medical History  Patient  Admitting Diagnosis: L MCA infarct History of Present Illness: Pt is a 87 year old female with medical hx significant for: CKD 3, HTN, HLD. Pt presented to hospital on 10/19 d/t right sided weakness of arm and leg and not being able to see out of R eye. Pt was not eligible for acute intervention. CT showed hypodensity in left thalamus. MRI revealed left lateral thalamic stroke. MRA head and neck showed severe right MCA M2 stenosis, moderate right PCA P2 and mild right MCA M1 stenosis. No large vessel occlusion revealed. 2D echo shoed EF of 60%. Therapy evaluations completed and CIR recommended d/t pt's deficits in functional mobility and ability to complete ADLs independently.    Complete NIHSS TOTAL: 3   Patient's medical record [facility] has been reviewed by the rehabilitation admission coordinator and physician.   Past Medical History      Past Medical History:  Diagnosis Date   Chronic kidney disease, stage 3b ([place]) 07/20/2021   Essential hypertension 12/01/2015   Mixed hyperlipidemia 12/01/2015      Has the patient had major surgery during 100 days prior to admission? No   Family History   family history is not on file.   Current Medications   Current Facility-Administered Medications:     stroke: mapping our early stages of recovery book, , Does not apply, Once, Shalhoub, Sherryll Burger, MD   acetaminophen (TYLENOL) tablet 650 mg, 650 mg, Oral, Q6H PRN **OR** acetaminophen (TYLENOL) suppository 650 mg, 650 mg, Rectal, Q6H PRN, Shalhoub, Sherryll Burger, MD   aspirin EC tablet 81 mg, 81 mg, Oral, Daily, Shalhoub, Sherryll Burger, MD, 81 mg at 07/24/21 3570   atorvastatin (LIPITOR) tablet 40 mg, 40 mg, Oral, Daily, Rosalin Hawking, MD, 40 mg at 07/24/21 1779   bisacodyl (DULCOLAX) suppository 10 mg, 10 mg, Rectal, Daily PRN, Charlynne Cousins, MD, 10 mg at 07/23/21 1035   clopidogrel (PLAVIX) tablet 75 mg, 75 mg, Oral, Daily, Shalhoub, Sherryll Burger, MD, 75 mg at 07/24/21 0833   enoxaparin  (LOVENOX) injection 30 mg, 30 mg, Subcutaneous, Q24H, Shalhoub, Sherryll Burger, MD, 30 mg at 07/24/21 3903   melatonin tablet 10 mg, 10 mg, Oral, QHS PRN, Shalhoub, Sherryll Burger, MD   ondansetron (ZOFRAN) tablet 4 mg, 4 mg, Oral, Q6H PRN **OR** ondansetron (ZOFRAN) injection 4 mg, 4 mg, Intravenous, Q6H PRN, Shalhoub, Sherryll Burger, MD   pantoprazole (PROTONIX)  EC tablet 40 mg, 40 mg, Oral, Daily, Shalhoub, Sherryll Burger, MD, 40 mg at 07/24/21 7867   polyethylene glycol (MIRALAX / GLYCOLAX) packet 17 g, 17 g, Oral, Daily PRN, Shalhoub, Sherryll Burger, MD, 17 g at 07/22/21 0930   Patients Current Diet:  Diet Order                  Diet - low sodium heart healthy             Diet Heart Room service appropriate? No; Fluid consistency: Thin  Diet effective now             Diet - low sodium heart healthy                         Precautions / Restrictions Precautions Precautions: Fall Restrictions Weight Bearing Restrictions: No    Has the patient had 2 or more falls or a fall with injury in the past year? No   Prior Activity Level Limited Community (1-2x/wk): gets out of house for MD appointments   Prior Functional Level Self Care: Did the patient need help bathing, dressing, using the toilet or eating? Independent   Indoor Mobility: Did the patient need assistance with walking from room to room (with or without device)? Independent   Stairs: Did the patient need assistance with internal or external stairs (with or without device)? Needed some help   Functional Cognition: Did the patient need help planning regular tasks such as shopping or remembering to take medications? Independent   Patient Information Are you of Hispanic, Latino/a,or Spanish origin?: A. No, not of Hispanic, Latino/a, or Spanish origin What is your race?: B. Black or African American Do you need or want an interpreter to communicate with a doctor or health care staff?: 0. No   Patient's Response To:  Health Literacy and  Transportation Is the patient able to respond to health literacy and transportation needs?: Yes Health Literacy - How often do you need to have someone help you when you read instructions, pamphlets, or other written material from your doctor or pharmacy?: Never In the past 12 months, has lack of transportation kept you from medical appointments or from getting medications?: No In the past 12 months, has lack of transportation kept you from meetings, work, or from getting things needed for daily living?: No   Home Assistive Devices / [place] Devices/Equipment: Radio producer (specify quad or straight), Walker (specify type) Home Equipment: Walker - 4 wheels, Shower seat   Prior Device Use: Indicate devices/aids used by the patient prior to current illness, exacerbation or injury? Walker and cane   Current Functional Level Cognition   Overall Cognitive Status: Within Functional Limits for tasks assessed Orientation Level: Oriented X4 General Comments: Pt very aware that she is too weak and unsafe to go home    Extremity Assessment (includes Sensation/Coordination)   Upper Extremity Assessment: RUE deficits/detail RUE Deficits / Details: Pt presenting with decreased coordination, numbness, and weakness in RUE. RUE Sensation: decreased light touch, decreased proprioception (Decreased light touch in hand only) RUE Coordination: decreased fine motor  Lower Extremity Assessment: RLE deficits/detail RLE Deficits / Details: Decreased strength, coordination, and sensation differences in LE. Pt reports decreased light touch on medial thigh and lateral lower leg. RLE Sensation: decreased light touch, decreased proprioception RLE Coordination: decreased gross motor, decreased fine motor     ADLs   Overall ADL's : Needs assistance/impaired Eating/Feeding: Set up Eating/Feeding Details (indicate  cue type and reason): Pt reports that it feels a lot messier now due to having to use her LUE to  assist at times when she can't keep ahold ofthe utensils Grooming: Set up, Sitting Upper Body Bathing: Min guard, Sitting Lower Body Bathing: Moderate assistance, Sitting/lateral leans, Sit to/from stand Upper Body Dressing : Min guard, Sitting Lower Body Dressing: Moderate assistance, Sitting/lateral leans, Sit to/from stand Toilet Transfer: Moderate assistance, Stand-pivot Toileting- Clothing Manipulation and Hygiene: Moderate assistance, Sitting/lateral lean, Sit to/from stand Functional mobility during ADLs: Moderate assistance, Rolling walker General ADL Comments: Pt R side is very weak, requiring her to need Increased support on the R side and to complete all ADL's that require standing and functional mobility.     Mobility   Overal bed mobility: Needs Assistance Bed Mobility: Supine to Sit Supine to sit: Min assist Sit to supine: Min guard General bed mobility comments: Assist and increased time and effort to scoot out fully to EOB. Several lateral losses of balance to the R when attempting to scoot out.     Transfers   Overall transfer level: Needs assistance Equipment used: Rolling walker (2 wheeled) Transfers: Sit to/from Stand Sit to Stand: Mod assist, From elevated surface General transfer comment: Heavy mod assist to power up from raised EOB to RW. VC's for R hand on walker and to push up to stand with LUE on bed.     Ambulation / Gait / Stairs / Wheelchair Mobility   Ambulation/Gait Ambulation/Gait assistance: Mod assist, Max assist Gait Distance (Feet): 5 Feet Assistive device: Rolling walker (2 wheeled) Gait Pattern/deviations: Step-to pattern, Decreased stride length, Trunk flexed, Decreased weight shift to right General Gait Details: Mod assist initially for balance and walker management. When pt began backing up to the chair, RLE knee buckled and max to total assist was required to prevent fall and return pt to standing. Sudden, uncontrolled descent to the chair when  pt felt it on the back of her legs. Gait velocity: Decreased Gait velocity interpretation: <1.31 ft/sec, indicative of household ambulator     Posture / Balance Dynamic Sitting Balance Sitting balance - Comments: R lateral lean. Unable to maintain sitting posture with challenge of MMT LE's. Balance Overall balance assessment: Needs assistance Sitting-balance support: Feet supported, No upper extremity supported Sitting balance-Leahy Scale: Poor Sitting balance - Comments: R lateral lean. Unable to maintain sitting posture with challenge of MMT LE's. Postural control: Right lateral lean, Posterior lean Standing balance support: Bilateral upper extremity supported Standing balance-Leahy Scale: Zero Standing balance comment: Up to total assist for balance support and to prevent fall.     Special needs/care consideration Skin ecchymosis: arm/left    Previous Home Environment (from acute therapy documentation) Living Arrangements: Children  Lives With: Daughter Available Help at Discharge: Family, Available 24 hours/day Type of Home: House Home Layout: One level Home Access: Level entry Bathroom Shower/Tub: Multimedia programmer: Handicapped height Bathroom Accessibility: Yes How Accessible: Accessible via walker [place]: No   Discharge Living Setting Plans for Discharge Living Setting: Patient's home Type of Home at Discharge: House Discharge Home Layout: One level Discharge Home Access: Level entry Discharge Bathroom Shower/Tub: Walk-in shower Discharge Bathroom Toilet: Handicapped height Discharge Bathroom Accessibility: Yes How Accessible: Accessible via walker Does the patient have any problems obtaining your medications?: No   Social/Family/Support Systems Anticipated Caregiver: Pearletha Alfred, daughter Anticipated Caregiver's Contact Information: [phone] Caregiver Availability: 24/7 Discharge Plan Discussed with Primary Caregiver: Yes Is  Caregiver In Agreement with  Plan?: Yes Does Caregiver/Family have Issues with Lodging/Transportation while Pt is in Rehab?: No   Goals Patient/Family Goal for Rehab: Mod I-Supervision: PT/OT Expected length of stay: 14-16 days Pt/Family Agrees to Admission and willing to participate: Yes Program Orientation Provided & Reviewed with Pt/Caregiver Including Roles  & Responsibilities: Yes   Decrease burden of Care through IP rehab admission: NA   Possible need for SNF placement upon discharge: Not anticipated   Patient Condition: I have reviewed medical records from [facility], spoken with CM, and patient and daughter. I met with patient at the bedside for inpatient rehabilitation assessment.  Patient will benefit from ongoing PT and OT, can actively participate in 3 hours of therapy a day 5 days of the week, and can make measurable gains during the admission.  Patient will also benefit from the coordinated team approach during an Inpatient Acute Rehabilitation admission.  The patient will receive intensive therapy as well as Rehabilitation physician, nursing, social worker, and care management interventions.  Due to safety, skin/wound care, disease management, medication administration, and patient education the patient requires 24 hour a day rehabilitation nursing.  The patient is currently Min G-Max A with mobility and Min G-Mod A with basic ADLs.  Discharge setting and therapy post discharge at home with home health is anticipated.  Patient has agreed to participate in the Acute Inpatient Rehabilitation Program and will admit today.   Preadmission Screen Completed By:  Bethel Born, 07/24/2021 10:06 AM ______________________________________________________________________   Discussed status with Dr. Letta Pate on 07/24/21  at 10:08 AM and received approval for admission today.   Admission Coordinator:  Bethel Born, CCC-SLP, time 10:09 AM/Date 07/24/21      Assessment/Plan: Diagnosis:  Left thalamic infarct Does the need for close, 24 hr/day Medical supervision in concert with the patient's rehab needs make it unreasonable for this patient to be served in a less intensive setting? Yes Co-Morbidities requiring supervision/potential complications: CKD 3, HTN, HLD Due to bladder management, bowel management, safety, skin/wound care, disease management, medication administration, pain management, and patient education, does the patient require 24 hr/day rehab nursing? Yes Does the patient require coordinated care of a physician, rehab nurse, PT, OT, and SLP to address physical and functional deficits in the context of the above medical diagnosis(es)? Yes Addressing deficits in the following areas: balance, endurance, locomotion, strength, transferring, bowel/bladder control, bathing, dressing, toileting, cognition, and psychosocial support Can the patient actively participate in an intensive therapy program of at least 3 hrs of therapy 5 days a week? Yes The potential for patient to make measurable gains while on inpatient rehab is good Anticipated functional outcomes upon discharge from inpatient rehab: supervision PT, supervision OT, n/a SLP Estimated rehab length of stay to reach the above functional goals is: 14-16d Anticipated discharge destination: Home 10. Overall Rehab/Functional Prognosis: excellent     MD Signature: Charlett Blake M.D. [facility] Fellow Am Acad of Phys Med and Rehab Diplomate Am Board of Electrodiagnostic Med Fellow Am Board of Interventional Pain

## 2021-07-24 NOTE — Progress Notes (Signed)
Inpatient Rehabilitation Admission Medication Review by a Pharmacist  A complete drug regimen review was completed for this patient to identify any potential clinically significant medication issues.  High Risk Drug Classes Is patient taking? Indication by Medication  Antipsychotic No   Anticoagulant Yes Enoxaparin  Antibiotic No   Opioid No   Antiplatelet Yes DAPT  Hypoglycemics/insulin No   Vasoactive Medication No   Chemotherapy No   Other No      Type of Medication Issue Identified Description of Issue Recommendation(s)  Drug Interaction(s) (clinically significant)     Duplicate Therapy     Allergy     No Medication Administration End Date     Incorrect Dose     Additional Drug Therapy Needed     Significant med changes from prior encounter (inform family/care partners about these prior to discharge).    Other       Clinically significant medication issues were identified that warrant physician communication and completion of prescribed/recommended actions by midnight of the next day:  No  Name of provider notified for urgent issues identified:   Provider Method of Notification:     Pharmacist comments:   Time spent performing this drug regimen review (minutes):  10   Mosetta Anis 07/24/2021 3:29 PM

## 2021-07-24 NOTE — TOC Transition Note (Signed)
Transition of Care [facility]) - CM/SW Discharge Note   Patient Details  Name: Diane Webb MRN: [id number] Date of Birth: 12/07/1926  [facility] CM/SW Contact:  Kermit Balo, RN Phone Number: 07/24/2021, 1:02 PM   Clinical Narrative:    Patient is discharging to CIR today. CM is signing off.   Final next level of care: IP Rehab Facility Barriers to Discharge: No Barriers Identified   Patient Goals and CMS Choice   CMS Medicare.gov Compare Post Acute Care list provided to:: Patient Choice offered to / list presented to : Patient, Adult Children  Discharge Placement                       Discharge Plan and Services   Discharge Planning Services: CM Consult Post Acute Care Choice: IP Rehab                               Social Determinants of Health (SDOH) Interventions     Readmission Risk Interventions No flowsheet data found.

## 2021-07-24 NOTE — Progress Notes (Signed)
   07/24/21 1200  Clinical Encounter Type  Visited With Patient and family together  Visit Type Initial  Referral From Nurse  Consult/Referral To Chaplain   Chaplain responded to the request for an Advance Directive. Chaplain provided A.D. education, and the patient stated she wants her daughter, Rinaldo Cloud, to be her HCPOA. Rinaldo Cloud said she would notify the nurse when the form was complete.This note was prepared by Deneen Harts, M.Div..  For questions please contact by phone [phone].

## 2021-07-24 NOTE — Progress Notes (Signed)
Patient arrived to unit with daughter and discharging unit nurse. Cletis Media, LPN

## 2021-07-24 NOTE — Progress Notes (Signed)
Patient ID: Diane Webb, female   DOB: 11-02-1926, 87 y.o.   MRN: [id number] Met with the patient and daughter to welcome and introduce self. Reviewed medical status, rehab therapy schedule and plan of care. Discussed secondary risk including HTN, HLD and CKD with dietary modifications recommended. Patient with stage 1 to buttocks; foam applied and hemorrhoids. Continue to follow along to discharge to address educational needs and concerns for discharge to facilitate preparation and transition back to home with the daughter. Margarito Liner

## 2021-07-24 NOTE — H&P (Signed)
Physical Medicine and Rehabilitation Admission H&P        Chief Complaint  Patient presents with   Code Stroke  : HPI: Diane Webb is a 87 year old right-handed female with history of CKD stage III, hypertension, hyperlipidemia.  Per chart review patient lives with her children.  1 level home with level entry.  Independent with assistive device.  Her daughter does assist with some ADLs.  Presented 07/19/2021 with acute onset of right side weakness and numbness.  CT/MRI showed acute infarct of the left lateral thalamus.  CT angiogram head and neck negative for large vessel occlusion.  Patient did not receive tPA.  Echocardiogram with ejection fraction of 60 to 65% no wall motion abnormalities grade 1 diastolic dysfunction.  Admission chemistry unremarkable except BUN 24 creatinine 1.52, urine drug screen negative.  Currently maintained on aspirin and Plavix for CVA prophylaxis x3 weeks then aspirin alone.  Subcutaneous Lovenox for DVT prophylaxis.  Tolerating a regular consistency diet.  Therapy evaluations completed due to patient's right side weakness and decreased functional mobility was admitted for a comprehensive rehab program   Review of Systems  Constitutional:  Negative for chills and fever.  HENT:  Negative for hearing loss.   Eyes:  Negative for double vision.  Respiratory:  Negative for cough and shortness of breath.   Cardiovascular:  Negative for chest pain, palpitations and leg swelling.  Gastrointestinal:  Positive for constipation. Negative for nausea and vomiting.       GERD  Genitourinary:  Negative for dysuria, flank pain and hematuria.  Musculoskeletal:  Positive for joint pain and myalgias.  Skin:  Negative for rash.  Neurological:  Positive for sensory change and weakness.  Psychiatric/Behavioral:  The patient has insomnia.   All other systems reviewed and are negative.     Past Medical History:  Diagnosis Date   Chronic kidney disease, stage 3b (HCC) 07/20/2021    Essential hypertension 12/01/2015   Mixed hyperlipidemia 12/01/2015    History reviewed. No pertinent surgical history.      Family History  Problem Relation Age of Onset   Stroke Neg Hx      Social History:  reports that she has never smoked. She has never used smokeless tobacco. No history on file for alcohol use and drug use. Allergies:       Allergies  Allergen Reactions   Alendronate Diarrhea   Amoxicillin-Pot Clavulanate Diarrhea   Meperidine Other (See Comments)      Syncope   Diclofenac Rash          Medications Prior to Admission  Medication Sig Dispense Refill   acetaminophen (TYLENOL) 650 MG CR tablet Take 1,300 mg by mouth every 8 (eight) hours as needed for pain.       losartan-hydrochlorothiazide (HYZAAR) 100-25 MG tablet Take 1 tablet by mouth daily.       meclizine (ANTIVERT) 25 MG tablet Take 25 mg by mouth every 6 (six) hours as needed for dizziness.       Menthol, Topical Analgesic, (BIOFREEZE) 4 % GEL Apply 1 application topically daily as needed (arthritis pain).       Multiple Vitamin (MULTIVITAMIN) capsule Take 1 capsule by mouth daily.       omeprazole (PRILOSEC) 40 MG capsule Take 40 mg by mouth daily.       potassium chloride (KLOR-CON) 10 MEQ tablet Take 10 mEq by mouth daily.       psyllium (REGULOID) 0.52 g capsule Take 0.52 g by mouth 2 (two) times  daily.          Drug Regimen Review Drug regimen was reviewed and remains appropriate with no significant issues identified   Home: Home Living Family/patient expects to be discharged to:: Private residence Living Arrangements: Children Available Help at Discharge: Family, Available 24 hours/day Type of Home: House Home Access: Level entry Home Layout: One level Bathroom Shower/Tub: Health visitor: Handicapped height Bathroom Accessibility: Yes Home Equipment: Environmental consultant - 4 wheels, Shower seat  Lives With: Daughter   Functional History: Prior Function (Read Only) Level of  Independence: Independent with assistive device(s) (Read Only) Comments: Reports that her daughter assists with IADL's, however she is able to do all ADL's on her own.   Functional Status:  Mobility: Bed Mobility Overal bed mobility: Needs Assistance Bed Mobility: Supine to Sit Supine to sit: Min assist Sit to supine: Min guard General bed mobility comments: Assist and increased time and effort to scoot out fully to EOB. Several lateral losses of balance to the R when attempting to scoot out. Transfers Overall transfer level: Needs assistance Equipment used: Rolling walker (2 wheeled) Transfers: Sit to/from Stand Sit to Stand: Mod assist, From elevated surface General transfer comment: Heavy mod assist to power up from raised EOB to RW. VC's for R hand on walker and to push up to stand with LUE on bed. Ambulation/Gait Ambulation/Gait assistance: Mod assist, Max assist Gait Distance (Feet): 5 Feet Assistive device: Rolling walker (2 wheeled) Gait Pattern/deviations: Step-to pattern, Decreased stride length, Trunk flexed, Decreased weight shift to right General Gait Details: Mod assist initially for balance and walker management. When pt began backing up to the chair, RLE knee buckled and max to total assist was required to prevent fall and return pt to standing. Sudden, uncontrolled descent to the chair when pt felt it on the back of her legs. Gait velocity: Decreased Gait velocity interpretation: <1.31 ft/sec, indicative of household ambulator   ADL: ADL Overall ADL's : Needs assistance/impaired Eating/Feeding: Set up Eating/Feeding Details (indicate cue type and reason): Pt reports that it feels a lot messier now due to having to use her LUE to assist at times when she can't keep ahold ofthe utensils Grooming: Set up, Sitting Upper Body Bathing: Min guard, Sitting Lower Body Bathing: Moderate assistance, Sitting/lateral leans, Sit to/from stand Upper Body Dressing : Min guard,  Sitting Lower Body Dressing: Moderate assistance, Sitting/lateral leans, Sit to/from stand Toilet Transfer: Moderate assistance, Stand-pivot Toileting- Clothing Manipulation and Hygiene: Moderate assistance, Sitting/lateral lean, Sit to/from stand Functional mobility during ADLs: Moderate assistance, Rolling walker General ADL Comments: Pt R side is very weak, requiring her to need Increased support on the R side and to complete all ADL's that require standing and functional mobility.   Cognition: Cognition Overall Cognitive Status: Within Functional Limits for tasks assessed Orientation Level: Oriented X4 Cognition Arousal/Alertness: Awake/alert Behavior During Therapy: WFL for tasks assessed/performed Overall Cognitive Status: Within Functional Limits for tasks assessed General Comments: Pt very aware that she is too weak and unsafe to go home   Physical Exam: Blood pressure (!) 135/53, pulse 68, temperature 97.6 F (36.4 C), temperature source Oral, resp. rate 17, height 4\' 6"  (1.372 m), weight 67.6 kg, SpO2 95 %. Physical Exam Neurological:     Comments: Patient is awake alert.  Makes eye contact with examiner.  Oriented to person place and time.  Follows simple commands.  Fair awareness of deficits.     General: No acute distress Mood and affect are appropriate Heart: Regular  rate and rhythm no rubs murmurs or extra sounds Lungs: Clear to auscultation, breathing unlabored, no rales or wheezes Abdomen: Positive bowel sounds, soft nontender to palpation, nondistended Extremities: No clubbing, cyanosis, or edema Skin: No evidence of breakdown, no evidence of rash Neurologic: Cranial nerves II through XII intact, motor strength is 5/5 in left and 4/5 RIght  deltoid, bicep, tricep, grip, hip flexor, knee extensors, ankle dorsiflexor and plantar flexor Sensory exam normal sensation to light touch and proprioception in left upper and lower extremities, absent on right side  Sensory  ataxia  Right finger nose finger and RIght heel shin Musculoskeletal: Full range of motion in all 4 extremities. No joint swelling    Lab Results Last 48 Hours  No results found for this or any previous visit (from the past 48 hour(s)).   Imaging Results (Last 48 hours)  No results found.           Medical Problem List and Plan: 1.  Right side weakness secondary to left lateral thalamic infarct likely small vessel disease             -patient may  shower             -ELOS/Goals: 14-16d 2.  Antithrombotics: -DVT/anticoagulation:  Pharmaceutical: Lovenox             -antiplatelet therapy: Aspirin 81 mg daily and Plavix 75 mg day x3 weeks then aspirin alone 3. Pain Management: Tylenol as needed 4. Mood: Melatonin 10 mg nightly as needed sleep             -antipsychotic agents: N/A 5. Neuropsych: This patient is capable of making decisions on her own behalf. 6. Skin/Wound Care: Routine skin checks 7. Fluids/Electrolytes/Nutrition: Routine in and outs with follow-up chemistries 8.  Permissive hypertension.  Patient on losartan/HCTZ 100/25 daily prior to admission.  Resume as needed 9.  Hyperlipidemia.  Lipitor 10.  CKD stage III.  Baseline creatinine 1.52.  Follow-up chemistries        Charlton Amor, PA-C 07/24/2021   "I have personally performed a face to face diagnostic evaluation of this patient.  Additionally, I have reviewed and concur with the physician assistant's documentation above." Erick Colace M.D. [facility] Fellow [employer] Diplomate Am Board of Electrodiagnostic Med Fellow Am Board of Interventional Pain

## 2021-07-24 NOTE — Discharge Summary (Addendum)
Physician Discharge Summary  Diane Webb ZHY:[id number] DOB: 17-May-1927 DOA: 07/19/2021  PCP: Gordan Payment., MD  Admit date: 07/19/2021 Discharge date: 07/24/2021  Admitted From: Home Disposition:  Inpatient rehab  Recommendations for Outpatient Follow-up:  Follow up with Neurology in 1-2 weeks Please obtain BMP/CBC in one week   Home Health:yes Equipment/Devices:none  Discharge Condition:Stable CODE STATUS:Full Diet recommendation: Heart Healthy  Brief/Interim Summary: 87 y.o. female past medical history significant of chronic kidney disease stage IIIb, essential hypertension comes into the [facility] for right-sided weakness that started on the morning of admission, throughout the day it progressively got worse came into the ED CT of the head did reveal hypodensity in the left thalamus, MRI of the brain showed an acute infarct in the left thalamus, MRA of the head and neck is pending started on aspirin  Discharge Diagnoses:  Principal Problem:   Acute stroke due to occlusion of left middle cerebral artery (HCC) Active Problems:   Essential hypertension   GERD without esophagitis   Mixed hyperlipidemia   Chronic kidney disease, stage 3b (HCC)  Acute stroke due to occlusion of the left middle cerebral artery: With an A1c of 5.1, fasting lipid panel HDL greater than 40 LDL greater than 130 she was started on a statin. MRI of the brain showed acute infarct of the left thalamus, MRA of the head showed no large vessel occlusion. Physical therapy evaluated the patient recommended inpatient rehab. She was previously not on aspirin she will be sent home on aspirin and Plavix for 3 weeks, then stop Plavix continue aspirin. 2D echo was done that showed an EF of 60% grade 1 diastolic heart failure.  Essential hypertension: Antihypertensive medications were held on admission to allow permissive hypertension, these will be resumed as an outpatient.  Chronic kidney disease stage  IIIb: Creatinine remained at baseline no changes made  Discharge Instructions  Discharge Instructions     Ambulatory referral to Neurology   Complete by: As directed    Follow up with stroke clinic NP (Jessica Vanschaick or Darrol Angel, if both not available, consider Manson Allan, or Ahern) at [facility] in about 4 weeks. Thanks.   Diet - low sodium heart healthy   Complete by: As directed    Increase activity slowly   Complete by: As directed       Allergies as of 07/21/2021       Reactions   Alendronate Diarrhea   Amoxicillin-pot Clavulanate Diarrhea   Meperidine Other (See Comments)   Syncope   Diclofenac Rash        Medication List     STOP taking these medications    multivitamin capsule       TAKE these medications    acetaminophen 650 MG CR tablet Commonly known as: TYLENOL Take 1,300 mg by mouth every 8 (eight) hours as needed for pain.   aspirin 81 MG EC tablet Take 1 tablet (81 mg total) by mouth daily. Swallow whole.   atorvastatin 40 MG tablet Commonly known as: LIPITOR Take 1 tablet (40 mg total) by mouth daily.   Biofreeze 4 % Gel Generic drug: Menthol (Topical Analgesic) Apply 1 application topically daily as needed (arthritis pain).   clopidogrel 75 MG tablet Commonly known as: PLAVIX Take 1 tablet (75 mg total) by mouth daily for 21 days.   losartan-hydrochlorothiazide 100-25 MG tablet Commonly known as: HYZAAR Take 1 tablet by mouth daily.   meclizine 25 MG tablet Commonly known as: ANTIVERT Take 25 mg by mouth  every 6 (six) hours as needed for dizziness.   omeprazole 40 MG capsule Commonly known as: PRILOSEC Take 40 mg by mouth daily.   potassium chloride 10 MEQ tablet Commonly known as: KLOR-CON Take 10 mEq by mouth daily.   psyllium 0.52 g capsule Commonly known as: REGULOID Take 0.52 g by mouth 2 (two) times daily.        Follow-up Information     [facility]. Schedule an appointment as soon  as possible for a visit in 1 month(s).   Specialty: Neurology Why: stroke clinic Contact information: [address] [place] [address] [phone]               Allergies  Allergen Reactions   Alendronate Diarrhea   Amoxicillin-Pot Clavulanate Diarrhea   Meperidine Other (See Comments)    Syncope   Diclofenac Rash    Consultations: Neurology   Procedures/Studies: MR ANGIO HEAD WO CONTRAST  Result Date: 07/20/2021 CLINICAL DATA:  87 year old female with neurologic deficit. Acute left thalamic lacunar infarct on MRI. EXAM: MRA HEAD WITHOUT CONTRAST TECHNIQUE: Angiographic images of the Circle of Willis were acquired using MRA technique without intravenous contrast. COMPARISON:  Brain MRI 0133 hours today. Neck MRA today reported separately. FINDINGS: No intracranial mass effect or ventriculomegaly. Antegrade flow in the posterior circulation with patent distal vertebral arteries. The left is mildly dominant. No distal vertebral or vertebrobasilar junction stenosis. Bilateral AICA appear to be patent and may be dominant. Patent basilar artery with mild tortuosity. Patent SCA and PCA origins. Both posterior communicating arteries are present with fetal type bilateral PCA origins. Left PCA branches are within normal limits. There is a moderate stenosis of the right PCA P2 (series 1013, image 14). Distal right PCA branches are within normal limits. Antegrade flow in both ICA siphons. Mild siphon irregularity without stenosis. Bilateral ophthalmic and posterior communicating artery origins appear normal. Patent carotid termini, MCA and ACA origins. Diminutive or absent anterior communicating artery. Tortuous proximal right ACA A2 segment, with artifact felt to explain the apparent flow gap there. Left MCA M1 is patent with mild irregularity but no significant stenosis. Left MCA bifurcation and visible MCA branches are within normal limits. Right MCA M1 is patent  with mild irregularity and stenosis proximal to the bifurcation (series 1003, image 3). Right MCA bifurcation is patent. But there is a severe stenosis of the dominant posterior right MCA M2 branch leading up to a bifurcation (series 1007, image 15 and series 5, image 135). Other visible right MCA branches are within normal limits. IMPRESSION: Negative for large vessel occlusion, but positive for intracranial branch atherosclerosis: Severe stenosis of a posterior Right MCA M2 branch. Mild stenosis of the Right MCA M1 segment. Moderate stenosis of the right PCA P2 segment. Electronically Signed   By: Odessa Fleming M.D.   On: 07/20/2021 07:28   MR ANGIO NECK WO CONTRAST  Result Date: 07/20/2021 CLINICAL DATA:  87 year old female with neurologic deficit. Acute left thalamic lacunar infarct on MRI. EXAM: MRA NECK WITHOUT CONTRAST TECHNIQUE: Angiographic images of the neck were acquired using MRA technique without intravenous contrast. Carotid stenosis measurements (when applicable) are obtained utilizing NASCET criteria, using the distal internal carotid diameter as the denominator. COMPARISON:  Brain MRI 0137 hours today. Intracranial MRA reported separately. FINDINGS: Two and 3D time-of-flight neck MRA. Antegrade flow in the bilateral cervical carotid and vertebral arteries. Evidence of a 3 vessel arch configuration. Bilateral carotids are moderately tortuous. Carotid bifurcations appear within normal  limits. No evidence of hemodynamically significant carotid stenosis in the neck. Left vertebral artery appears mildly dominant and is tortuous. Antegrade flow in both vertebral arteries to the skull base without evidence of hemodynamically significant stenosis. IMPRESSION: Tortuous cervical carotid and vertebral arteries with no evidence of hemodynamically significant stenosis. Electronically Signed   By: Odessa Fleming M.D.   On: 07/20/2021 07:23   MR BRAIN WO CONTRAST  Result Date: 07/20/2021 CLINICAL DATA:  Neuro  deficit, stroke suspected EXAM: MRI HEAD WITHOUT CONTRAST TECHNIQUE: Multiplanar, multiecho pulse sequences of the brain and surrounding structures were obtained without intravenous contrast. COMPARISON:  No prior MRI, correlation is made with CT head 07/19/2021. FINDINGS: Brain: Restricted diffusion in the left lateral thalamus, which correlates with the hypodensity seen on the 07/19/2021 CT head. No other foci of restricted diffusion. No acute hemorrhage. Nose hemosiderin deposition to suggest remote hemorrhage. No hydrocephalus, extra-axial collection, mass, mass effect, or midline shift. Confluent T2 hyperintense signal in the periventricular white matter and pons, likely the sequela of severe chronic small vessel ischemic disease. Vascular: Normal flow voids. Skull and upper cervical spine: Normal marrow signal. Sinuses/Orbits: Negative.  Status post bilateral lens replacements. Other: The mastoids are well aerated. IMPRESSION: Acute infarct of the left lateral thalamus, which correlates with the hypodensity seen on the same-day CT head. Imaging results were communicated on 07/20/2021 at 2:31 am to provider [facility] via secure text paging. Electronically Signed   By: Wiliam Ke M.D.   On: 07/20/2021 02:33   ECHOCARDIOGRAM COMPLETE  Result Date: 07/20/2021    ECHOCARDIOGRAM REPORT   Patient Name:   Diane Webb Date of Exam: 07/20/2021 Medical Rec #:  [id number]     Height: Accession #:    [id number]    Weight: Date of Birth:  1927/04/13     BSA: Patient Age:    87 years      BP:           132/99 mmHg Patient Gender: F             HR:           90 bpm. Exam Location:  Inpatient Procedure: 2D Echo, Cardiac Doppler and Color Doppler Indications:    CVA  History:        Patient has no prior history of Echocardiogram examinations.                 Signs/Symptoms:CKD; Risk Factors:Hypertension, Dyslipidemia and                 Obesity.  Sonographer:    Lavenia Atlas RDCS Referring Phys: [id number] Deno Lunger  [facility] IMPRESSIONS  1. Left ventricular ejection fraction, by estimation, is 60 to 65%. The left ventricle has normal function. The left ventricle has no regional wall motion abnormalities. There is mild left ventricular hypertrophy. Left ventricular diastolic parameters are consistent with Grade I diastolic dysfunction (impaired relaxation).  2. Right ventricular systolic function is normal. The right ventricular size is normal. There is normal pulmonary artery systolic pressure.  3. Left atrial size was mildly dilated.  4. The mitral valve is normal in structure. No evidence of mitral valve regurgitation. No evidence of mitral stenosis.  5. The aortic valve is tricuspid. There is mild calcification of the aortic valve. Aortic valve regurgitation is not visualized. No aortic stenosis is present.  6. The inferior vena cava is normal in size with greater than 50% respiratory variability, suggesting right atrial pressure of 3 mmHg. FINDINGS  Left  Ventricle: Left ventricular ejection fraction, by estimation, is 60 to 65%. The left ventricle has normal function. The left ventricle has no regional wall motion abnormalities. The left ventricular internal cavity size was normal in size. There is  mild left ventricular hypertrophy. Left ventricular diastolic parameters are consistent with Grade I diastolic dysfunction (impaired relaxation). Right Ventricle: The right ventricular size is normal. No increase in right ventricular wall thickness. Right ventricular systolic function is normal. There is normal pulmonary artery systolic pressure. The tricuspid regurgitant velocity is 2.74 m/s, and  with an assumed right atrial pressure of 3 mmHg, the estimated right ventricular systolic pressure is 33.0 mmHg. Left Atrium: Left atrial size was mildly dilated. Right Atrium: Right atrial size was normal in size. Pericardium: There is no evidence of pericardial effusion. Mitral Valve: The mitral valve is normal in structure. No  evidence of mitral valve regurgitation. No evidence of mitral valve stenosis. Tricuspid Valve: The tricuspid valve is normal in structure. Tricuspid valve regurgitation is trivial. No evidence of tricuspid stenosis. Aortic Valve: The aortic valve is tricuspid. There is mild calcification of the aortic valve. Aortic valve regurgitation is not visualized. No aortic stenosis is present. Pulmonic Valve: The pulmonic valve was normal in structure. Pulmonic valve regurgitation is not visualized. No evidence of pulmonic stenosis. Aorta: The aortic root is normal in size and structure. Venous: The inferior vena cava is normal in size with greater than 50% respiratory variability, suggesting right atrial pressure of 3 mmHg. IAS/Shunts: No atrial level shunt detected by color flow Doppler.  LEFT VENTRICLE PLAX 2D LVIDd:         3.95 cm     Diastology LVIDs:         2.60 cm     LV e' medial:    4.90 cm/s LV PW:         1.25 cm     LV E/e' medial:  10.7 LV IVS:        1.10 cm     LV e' lateral:   5.98 cm/s LVOT diam:     2.30 cm     LV E/e' lateral: 8.7 LV SV:         88 LVOT Area:     4.15 cm  LV Volumes (MOD) LV vol d, MOD A4C: 67.6 ml LV vol s, MOD A4C: 29.2 ml LV SV MOD A4C:     67.6 ml RIGHT VENTRICLE RV Basal diam:  2.80 cm RV S prime:     7.46 cm/s TAPSE (M-mode): 2.2 cm LEFT ATRIUM             RIGHT ATRIUM LA diam:        3.30 cm RA Area:     11.30 cm LA Vol (A2C):   48.7 ml RA Volume:   24.70 ml LA Vol (A4C):   25.9 ml LA Biplane Vol: 36.9 ml  AORTIC VALVE LVOT Vmax:   95.30 cm/s LVOT Vmean:  66.200 cm/s LVOT VTI:    0.211 m  AORTA Ao Root diam: 3.10 cm MITRAL VALVE               TRICUSPID VALVE MV Area (PHT): 3.50 cm    TR Peak grad:   30.0 mmHg MV Decel Time: 217 msec    TR Vmax:        274.00 cm/s MV E velocity: 52.30 cm/s MV A velocity: 77.60 cm/s  SHUNTS MV E/A ratio:  0.67  Systemic VTI:  0.21 m                            Systemic Diam: 2.30 cm Arvilla Meres MD Electronically signed by Arvilla Meres MD Signature Date/Time: 07/20/2021/8:31:16 PM    Final    CT HEAD CODE STROKE WO CONTRAST  Result Date: 07/19/2021 CLINICAL DATA:  Code stroke. EXAM: CT HEAD WITHOUT CONTRAST TECHNIQUE: Contiguous axial images were obtained from the base of the skull through the vertex without intravenous contrast. COMPARISON:  None. FINDINGS: Brain: Hypodensity in the left lateral thalamus, of indeterminate acuity, possibly acute or subacute infarct. No acute hemorrhage, hydrocephalus, extra-axial collection, mass, mass effect, or midline shift. Periventricular white matter changes, likely the sequela of chronic small vessel ischemic disease. Vascular: No hyperdense vessel. Skull: Normal. Negative for fracture or focal lesion. Sinuses/Orbits: No acute finding. Other: The mastoids are well aerated. ASPECTS [facility] Stroke Program Early CT Score) - Ganglionic level infarction (caudate, lentiform nuclei, internal capsule, insula, M1-M3 cortex): 7 - Supraganglionic infarction (M4-M6 cortex): 3 Total score (0-10 with 10 being normal): 9 IMPRESSION: 1. Hypodensity in the left lateral thalamus, of indeterminate acuity, but possibly representing an acute or subacute infarct. 2. ASPECTS is 10 Code stroke imaging results were communicated on 07/19/2021 at 11:20 pm to provider [facility] via secure text paging. Electronically Signed   By: Wiliam Ke M.D.   On: 07/19/2021 23:21   (Echo, Carotid, EGD, Colonoscopy, ERCP)    Subjective: No complaints  Discharge Exam: Vitals:   07/20/21 2350 07/21/21 0350  BP: 137/72 124/74  Pulse: 74 79  Resp: 20 18  Temp: 98.2 F (36.8 C) 97.6 F (36.4 C)  SpO2: 96% 95%   Vitals:   07/20/21 1700 07/20/21 2021 07/20/21 2350 07/21/21 0350  BP: (!) 156/75 (!) 159/68 137/72 124/74  Pulse: 89 77 74 79  Resp: 18 17 20 18   Temp: 97.8 F (36.6 C) (!) 97.4 F (36.3 C) 98.2 F (36.8 C) 97.6 F (36.4 C)  TempSrc: Oral Oral Oral Oral  SpO2: 100% 99% 96% 95%  Weight:      Height:  4\' 6"  (1.372 m)       General: Pt is alert, awake, not in acute distress Cardiovascular: RRR, S1/S2 +, no rubs, no gallops Respiratory: CTA bilaterally, no wheezing, no rhonchi Abdominal: Soft, NT, ND, bowel sounds + Extremities: no edema, no cyanosis    The results of significant diagnostics from this hospitalization (including imaging, microbiology, ancillary and laboratory) are listed below for reference.     Microbiology: Recent Results (from the past 240 hour(s))  Resp Panel by RT-PCR (Flu A&B, Covid) Nasopharyngeal Swab     Status: None   Collection Time: 07/19/21 11:06 PM   Specimen: Nasopharyngeal Swab; Nasopharyngeal(NP) swabs in vial transport medium  Result Value Ref Range Status   SARS Coronavirus 2 by RT PCR NEGATIVE NEGATIVE Final    Comment: (NOTE) SARS-CoV-2 target nucleic acids are NOT DETECTED.  The SARS-CoV-2 RNA is generally detectable in upper respiratory specimens during the acute phase of infection. The lowest concentration of SARS-CoV-2 viral copies this assay can detect is 138 copies/mL. A negative result does not preclude SARS-Cov-2 infection and should not be used as the sole basis for treatment or other patient management decisions. A negative result may occur with  improper specimen collection/handling, submission of specimen other than nasopharyngeal swab, presence of viral mutation(s) within the areas targeted by this assay, and inadequate  number of viral copies(<138 copies/mL). A negative result must be combined with clinical observations, patient history, and epidemiological information. The expected result is Negative.  Fact Sheet for Patients:  BloggerCourse.com  Fact Sheet for Healthcare Providers:  SeriousBroker.it  This test is no t yet approved or cleared by the [place] FDA and  has been authorized for detection and/or diagnosis of SARS-CoV-2 by FDA under an Emergency Use  Authorization (EUA). This EUA will remain  in effect (meaning this test can be used) for the duration of the COVID-19 declaration under Section 564(b)(1) of the Act, 21 U.S.C.section 360bbb-3(b)(1), unless the authorization is terminated  or revoked sooner.       Influenza A by PCR NEGATIVE NEGATIVE Final   Influenza B by PCR NEGATIVE NEGATIVE Final    Comment: (NOTE) The Xpert Xpress SARS-CoV-2/FLU/RSV plus assay is intended as an aid in the diagnosis of influenza from Nasopharyngeal swab specimens and should not be used as a sole basis for treatment. Nasal washings and aspirates are unacceptable for Xpert Xpress SARS-CoV-2/FLU/RSV testing.  Fact Sheet for Patients: BloggerCourse.com  Fact Sheet for Healthcare Providers: SeriousBroker.it  This test is not yet approved or cleared by the [place] FDA and has been authorized for detection and/or diagnosis of SARS-CoV-2 by FDA under an Emergency Use Authorization (EUA). This EUA will remain in effect (meaning this test can be used) for the duration of the COVID-19 declaration under Section 564(b)(1) of the Act, 21 U.S.C. section 360bbb-3(b)(1), unless the authorization is terminated or revoked.  Performed at [facility], [address] [place], [place] [address]      Labs: BNP (last 3 results) No results for input(s): BNP in the last 8760 hours. Basic Metabolic Panel: Recent Labs  Lab 07/19/21 2311 07/20/21 0218 07/20/21 0829  NA 139 138 139  K 4.6 4.5 4.2  CL 107 104 102  CO2  --  24 25  GLUCOSE 125* 104* 109*  BUN 30* 24* 23  CREATININE 1.60* 1.52* 1.34*  CALCIUM  --  9.4 9.5  MG  --   --  2.0   Liver Function Tests: Recent Labs  Lab 07/20/21 0218 07/20/21 0829  AST 19 20  ALT 15 17  ALKPHOS 56 58  BILITOT 0.5 0.7  PROT 6.5 7.1  ALBUMIN 3.5 3.9   No results for input(s): LIPASE, AMYLASE in the last 168 hours. No results for input(s):  AMMONIA in the last 168 hours. CBC: Recent Labs  Lab 07/19/21 2311 07/20/21 0218 07/20/21 0829  WBC  --  9.5 9.4  NEUTROABS  --  6.2 5.5  HGB 13.9 12.7 13.4  HCT 41.0 39.2 42.0  MCV  --  90.7 91.1  PLT  --  207 229   Cardiac Enzymes: No results for input(s): CKTOTAL, CKMB, CKMBINDEX, TROPONINI in the last 168 hours. BNP: Invalid input(s): POCBNP CBG: No results for input(s): GLUCAP in the last 168 hours. D-Dimer No results for input(s): DDIMER in the last 72 hours. Hgb A1c Recent Labs    07/20/21 0829  HGBA1C 5.1   Lipid Profile Recent Labs    07/20/21 0829  CHOL 293*  HDL 59  LDLCALC 198*  TRIG 181*  CHOLHDL 5.0   Thyroid function studies No results for input(s): TSH, T4TOTAL, T3FREE, THYROIDAB in the last 72 hours.  Invalid input(s): FREET3 Anemia work up No results for input(s): VITAMINB12, FOLATE, FERRITIN, TIBC, IRON, RETICCTPCT in the last 72 hours. Urinalysis    Component Value Date/Time  COLORURINE YELLOW 07/20/2021 1040   APPEARANCEUR CLEAR 07/20/2021 1040   LABSPEC 1.016 07/20/2021 1040   PHURINE 6.0 07/20/2021 1040   GLUCOSEU NEGATIVE 07/20/2021 1040   HGBUR NEGATIVE 07/20/2021 1040   BILIRUBINUR NEGATIVE 07/20/2021 1040   KETONESUR NEGATIVE 07/20/2021 1040   PROTEINUR NEGATIVE 07/20/2021 1040   NITRITE NEGATIVE 07/20/2021 1040   LEUKOCYTESUR MODERATE (A) 07/20/2021 1040   Sepsis Labs Invalid input(s): PROCALCITONIN,  WBC,  LACTICIDVEN Microbiology Recent Results (from the past 240 hour(s))  Resp Panel by RT-PCR (Flu A&B, Covid) Nasopharyngeal Swab     Status: None   Collection Time: 07/19/21 11:06 PM   Specimen: Nasopharyngeal Swab; Nasopharyngeal(NP) swabs in vial transport medium  Result Value Ref Range Status   SARS Coronavirus 2 by RT PCR NEGATIVE NEGATIVE Final    Comment: (NOTE) SARS-CoV-2 target nucleic acids are NOT DETECTED.  The SARS-CoV-2 RNA is generally detectable in upper respiratory specimens during the acute phase of  infection. The lowest concentration of SARS-CoV-2 viral copies this assay can detect is 138 copies/mL. A negative result does not preclude SARS-Cov-2 infection and should not be used as the sole basis for treatment or other patient management decisions. A negative result may occur with  improper specimen collection/handling, submission of specimen other than nasopharyngeal swab, presence of viral mutation(s) within the areas targeted by this assay, and inadequate number of viral copies(<138 copies/mL). A negative result must be combined with clinical observations, patient history, and epidemiological information. The expected result is Negative.  Fact Sheet for Patients:  BloggerCourse.com  Fact Sheet for Healthcare Providers:  SeriousBroker.it  This test is no t yet approved or cleared by the [place] FDA and  has been authorized for detection and/or diagnosis of SARS-CoV-2 by FDA under an Emergency Use Authorization (EUA). This EUA will remain  in effect (meaning this test can be used) for the duration of the COVID-19 declaration under Section 564(b)(1) of the Act, 21 U.S.C.section 360bbb-3(b)(1), unless the authorization is terminated  or revoked sooner.       Influenza A by PCR NEGATIVE NEGATIVE Final   Influenza B by PCR NEGATIVE NEGATIVE Final    Comment: (NOTE) The Xpert Xpress SARS-CoV-2/FLU/RSV plus assay is intended as an aid in the diagnosis of influenza from Nasopharyngeal swab specimens and should not be used as a sole basis for treatment. Nasal washings and aspirates are unacceptable for Xpert Xpress SARS-CoV-2/FLU/RSV testing.  Fact Sheet for Patients: BloggerCourse.com  Fact Sheet for Healthcare Providers: SeriousBroker.it  This test is not yet approved or cleared by the [place] FDA and has been authorized for detection and/or diagnosis of SARS-CoV-2  by FDA under an Emergency Use Authorization (EUA). This EUA will remain in effect (meaning this test can be used) for the duration of the COVID-19 declaration under Section 564(b)(1) of the Act, 21 U.S.C. section 360bbb-3(b)(1), unless the authorization is terminated or revoked.  Performed at [facility], [address] [place], [place] [address]       SIGNED:   Marinda Elk, MD  [facility] 07/21/2021, 8:51 AM Pager   If 7PM-7AM, please contact night-coverage [email or web address] Password [email or web address]

## 2021-07-24 NOTE — Plan of Care (Signed)
  Problem: Education: Goal: Knowledge of General Education information will improve Description: Including pain rating scale, medication(s)/side effects and non-pharmacologic comfort measures Outcome: Adequate for Discharge   Problem: Health Behavior/Discharge Planning: Goal: Ability to manage health-related needs will improve Outcome: Adequate for Discharge   Problem: Clinical Measurements: Goal: Ability to maintain clinical measurements within normal limits will improve Outcome: Adequate for Discharge Goal: Will remain free from infection Outcome: Adequate for Discharge Goal: Diagnostic test results will improve Outcome: Adequate for Discharge Goal: Respiratory complications will improve Outcome: Adequate for Discharge Goal: Cardiovascular complication will be avoided Outcome: Adequate for Discharge   Problem: Ischemic Stroke/TIA Tissue Perfusion: Goal: Complications of ischemic stroke/TIA will be minimized Outcome: Adequate for Discharge   Problem: Education: Goal: Knowledge of disease or condition will improve Outcome: Adequate for Discharge Goal: Knowledge of secondary prevention will improve Outcome: Adequate for Discharge Goal: Knowledge of patient specific risk factors addressed and post discharge goals established will improve Outcome: Adequate for Discharge   Problem: Skin Integrity: Goal: Risk for impaired skin integrity will decrease Outcome: Adequate for Discharge

## 2021-07-25 ENCOUNTER — Other Ambulatory Visit (HOSPITAL_COMMUNITY): Payer: Self-pay

## 2021-07-25 LAB — COMPREHENSIVE METABOLIC PANEL
ALT: 17 U/L (ref 0–44)
AST: 17 U/L (ref 15–41)
Albumin: 3.2 g/dL — ABNORMAL LOW (ref 3.5–5.0)
Alkaline Phosphatase: 55 U/L (ref 38–126)
Anion gap: 8 (ref 5–15)
BUN: 25 mg/dL — ABNORMAL HIGH (ref 8–23)
CO2: 26 mmol/L (ref 22–32)
Calcium: 9 mg/dL (ref 8.9–10.3)
Chloride: 104 mmol/L (ref 98–111)
Creatinine, Ser: 1.33 mg/dL — ABNORMAL HIGH (ref 0.44–1.00)
GFR, Estimated: 38 mL/min — ABNORMAL LOW (ref >60)
Glucose, Bld: 100 mg/dL — ABNORMAL HIGH (ref 70–99)
Potassium: 4.1 mmol/L (ref 3.5–5.1)
Sodium: 138 mmol/L (ref 135–145)
Total Bilirubin: 0.8 mg/dL (ref 0.3–1.2)
Total Protein: 6.2 g/dL — ABNORMAL LOW (ref 6.5–8.1)

## 2021-07-25 LAB — CBC WITH DIFFERENTIAL/PLATELET
Abs Immature Granulocytes: 0.04 10*3/uL (ref 0.00–0.07)
Basophils Absolute: 0 10*3/uL (ref 0.0–0.1)
Basophils Relative: 1 %
Eosinophils Absolute: 0.4 10*3/uL (ref 0.0–0.5)
Eosinophils Relative: 5 %
HCT: 36.6 % (ref 36.0–46.0)
Hemoglobin: 11.9 g/dL — ABNORMAL LOW (ref 12.0–15.0)
Immature Granulocytes: 1 %
Lymphocytes Relative: 26 %
Lymphs Abs: 2.1 10*3/uL (ref 0.7–4.0)
MCH: 29.2 pg (ref 26.0–34.0)
MCHC: 32.5 g/dL (ref 30.0–36.0)
MCV: 89.7 fL (ref 80.0–100.0)
Monocytes Absolute: 0.8 10*3/uL (ref 0.1–1.0)
Monocytes Relative: 9 %
Neutro Abs: 4.8 10*3/uL (ref 1.7–7.7)
Neutrophils Relative %: 58 %
Platelets: 261 10*3/uL (ref 150–400)
RBC: 4.08 MIL/uL (ref 3.87–5.11)
RDW: 12.5 % (ref 11.5–15.5)
WBC: 8.1 10*3/uL (ref 4.0–10.5)
nRBC: 0 % (ref 0.0–0.2)

## 2021-07-25 MED ORDER — CARVEDILOL 3.125 MG PO TABS
3.1250 mg | ORAL_TABLET | Freq: Every day | ORAL | Status: DC
  Administered 2021-07-25 – 2021-07-31 (×7): 3.125 mg via ORAL
  Filled 2021-07-25 (×7): qty 1

## 2021-07-25 NOTE — Evaluation (Signed)
Physical Therapy Assessment and Plan  Patient Details  Name: Diane Webb MRN: [id number] Date of Birth: 12-01-1926  PT Diagnosis: Abnormal posture, Abnormality of gait, Coordination disorder, Difficulty walking, Edema, Hemiparesis dominant, Impaired sensation, and Muscle weakness Rehab Potential: Good ELOS: 3 weeks   Today's Date: 07/25/2021 PT Individual Time: 1100-1154 PT Individual Time Calculation (min): 54 min    Hospital Problem: Principal Problem:   Acute stroke due to occlusion of left middle cerebral artery ([place]) Active Problems:   Left thalamic infarction [facility])   Past Medical History:  Past Medical History:  Diagnosis Date   Chronic kidney disease, stage 3b ([place]) 07/20/2021   Essential hypertension 12/01/2015   Mixed hyperlipidemia 12/01/2015   Past Surgical History: History reviewed. No pertinent surgical history.  Assessment & Plan Clinical Impression: Patient is a 87 y.o. year old female with history of CKD stage III, hypertension, hyperlipidemia.  Per chart review patient lives with her children.  1 level home with level entry.  Independent with assistive device.  Her daughter does assist with some ADLs.  Presented 07/19/2021 with acute onset of right side weakness and numbness.  CT/MRI showed acute infarct of the left lateral thalamus.  CT angiogram head and neck negative for large vessel occlusion.  Patient did not receive tPA.  Echocardiogram with ejection fraction of 60 to 65% no wall motion abnormalities grade 1 diastolic dysfunction.  Admission chemistry unremarkable except BUN 24 creatinine 1.52, urine drug screen negative.  Currently maintained on aspirin and Plavix for CVA prophylaxis x3 weeks then aspirin alone.  Subcutaneous Lovenox for DVT prophylaxis.  Tolerating a regular consistency diet.  Therapy evaluations completed due to patient's right side weakness and decreased functional mobility was admitted for a comprehensive rehab program  Patient currently  requires max with mobility secondary to muscle weakness and muscle joint tightness, decreased cardiorespiratoy endurance, impaired timing and sequencing, abnormal tone, unbalanced muscle activation, motor apraxia, decreased coordination, and decreased motor planning, decreased attention to right and decreased motor planning, and decreased sitting balance, decreased standing balance, decreased postural control, hemiplegia, and decreased balance strategies.  Prior to hospitalization, patient was modified independent  with mobility and lived with Daughter, Other (Comment) (and son in Sports coach) in a House home.  Home access is 2 steps on back entranceStairs to enter.  Patient will benefit from skilled PT intervention to maximize safe functional mobility, minimize fall risk, and decrease caregiver burden for planned discharge home with 24 hour supervision.  Anticipate patient will benefit from follow up [place] at discharge.  PT - End of Session Activity Tolerance: Tolerates 30+ min activity with multiple rests Endurance Deficit: Yes Endurance Deficit Description: required rest breaks throughout PT Assessment Rehab Potential (ACUTE/IP ONLY): Good PT Barriers to Discharge: [place] home environment;Other (comments) PT Barriers to Discharge Comments: R hemiparesis, weakness, deconditoning PT Patient demonstrates impairments in the following area(s): Balance;Edema;Endurance;Motor;Nutrition;Perception;Safety;Sensory;Skin Integrity PT Transfers Functional Problem(s): Bed Mobility;Bed to Chair;Car;Furniture PT Locomotion Functional Problem(s): Ambulation;Wheelchair Mobility;Stairs PT Plan PT Intensity: Minimum of 1-2 x/day ,45 to 90 minutes PT Frequency: 5 out of 7 days PT Duration Estimated Length of Stay: 3 weeks PT Treatment/Interventions: Ambulation/gait training;Discharge planning;Functional mobility training;Psychosocial support;Therapeutic Activities;Visual/perceptual  remediation/compensation;Balance/vestibular training;Disease management/prevention;Neuromuscular re-education;Skin care/wound management;Therapeutic Exercise;Wheelchair propulsion/positioning;Cognitive remediation/compensation;DME/adaptive equipment instruction;Pain management;Splinting/orthotics;UE/LE Strength taining/ROM;Community reintegration;Functional electrical stimulation;Patient/family education;Stair training;UE/LE Coordination activities PT Transfers Anticipated Outcome(s): supervision with LRAD PT Locomotion Anticipated Outcome(s): min A with LRAD PT Recommendation Follow Up Recommendations: Home health PT Patient destination: Home Equipment Recommended: To be determined Equipment Details: has rollator  PT Evaluation Precautions/Restrictions Precautions Precautions: Fall Precaution Comments: R hemiparesis; Pt's R knee has tendency to buckle during transfers Restrictions Weight Bearing Restrictions: No Pain Interference Pain Interference Pain Effect on Sleep: 0. Does not apply - I have not had any pain or hurting in the past 5 days Pain Interference with Therapy Activities: 0. Does not apply - I have not received rehabilitationtherapy in the past 5 days Pain Interference with Day-to-Day Activities: 1. Rarely or not at all Home Living/Prior [place] Living Arrangements: Children Available Help at Discharge: Family;Available 24 hours/day Type of Home: House Home Access: Stairs to enter [employer] of Steps: 2 steps on back entrance Entrance Stairs-Rails: Right Home Layout: One level Bathroom Shower/Tub: Multimedia programmer: Handicapped height Bathroom Accessibility: Yes Additional Comments: used rollator prior to admission and cane when going out  Lives With: Daughter;Other (Comment) (and son in law) Prior Function Level of Independence: Requires assistive device for independence Meal Prep: Other (comment) (pt reports daughter was  completing meal prep tasks for her PTA) Laundry: Other (comment) (pt reports daughter was completing laundry tasks for her PTA) Light Housekeeping: Other (comment) (pt reports daughter was completing all house cleaning tasks for her PTA)  Able to Take Stairs?: No (needed assist from daughter) Driving: No Vocation: Retired Vision/Perception  Vision - History Ability to See in Adequate Light: 1 Impaired Vision - Assessment Eye Alignment: Within Functional Limits Ocular Range of Motion: Within Functional Limits Alignment/Gaze Preference: Within Defined Limits Tracking/Visual Pursuits: Decreased smoothness of eye movement to RIGHT inferior field;Decreased smoothness of eye movement to LEFT inferior field;Decreased smoothness of vertical tracking;Decreased smoothness of horizontal tracking Saccades: Overshoots;Other (comment) (Pt unable to sustain focus on visual stimuli during saccades) Additional Comments: Pt's vision was [facility] for ADL tasks, however during vision assessment, pt presents with decreased smoothness during tracking in all fields as well as decreased ability to sustain focus on visual stimuli during saccades Perception Perception: Impaired Spatial Orientation: Pt presented with overshooting during ADL tasks, with several spills during bathing and grooming tasks, however with report of feeling like R arm is "woosy feeling" Praxis Praxis: Impaired Praxis Impairment Details: Motor planning Praxis-Other Comments: Requires increased time to motor plan with decreased accuracy in motor movements  Cognition Overall Cognitive Status: Within Functional Limits for tasks assessed Arousal/Alertness: Awake/alert Memory: Appears intact Awareness: Appears intact Problem Solving: Impaired Sequencing: Impaired Safety/Judgment: Impaired Sensation Sensation Light Touch: Impaired by gross assessment Proprioception: Impaired by gross assessment Additional Comments: Pt reports "tingling" from  L1-L3 dermatomes on RLE and then decreased sensation RLE>LLE from R knee down Coordination Gross Motor Movements are Fluid and Coordinated: No Fine Motor Movements are Fluid and Coordinated: No Coordination and Movement Description: grossly uncoordinated due to R hemiparesis, generalized weakness/deconditioning, decreased balance/postural control, and impaired motor planning/sequencing Finger Nose Finger Test: dysmatria bilaterally, more severe RUE>LUE Heel Shin Test: decreased ROM bilaterally, decreased coordination RLE>LLE Motor  Motor Motor: Hemiplegia;Abnormal tone Motor - Skilled Clinical Observations: grossly uncoordinated due to R hemiparesis, generalized weakness/deconditioning, decreased balance/postural control, and impaired motor planning/sequencing  Trunk/Postural Assessment  Cervical Assessment Cervical Assessment: Exceptions to [facility] (kyphosis) Thoracic Assessment Thoracic Assessment: Exceptions to [facility] (kyphosis) Lumbar Assessment Lumbar Assessment: Exceptions to [facility] (posterior pelvic tilt) Postural Control Postural Control: Deficits on evaluation (posterior lean in sitting)  Balance Balance Balance Assessed: Yes Static Sitting Balance Static Sitting - Balance Support: Feet supported;Bilateral upper extremity supported Static Sitting - Level of Assistance: 5: Stand by assistance (supervision) Dynamic Sitting Balance Dynamic Sitting -  Balance Support: Feet supported;No upper extremity supported Dynamic Sitting - Level of Assistance: 5: Stand by assistance (CGA) Static Standing Balance Static Standing - Balance Support: Right upper extremity supported (HHA) Static Standing - Level of Assistance: 3: Mod assist Dynamic Standing Balance Dynamic Standing - Balance Support: Right upper extremity supported (HHA) Dynamic Standing - Level of Assistance: 2: Max assist Dynamic Standing - Comments: with transfers Extremity Assessment  RLE Assessment RLE Assessment: Exceptions to  [facility] RLE Strength Right Hip Flexion: 3+/5 Right Hip ABduction: 3+/5 Right Hip ADduction: 3+/5 Right Knee Flexion: 3+/5 Right Knee Extension: 3+/5 Right Ankle Dorsiflexion: 4-/5 Right Ankle Plantar Flexion: 4-/5 LLE Assessment LLE Assessment: Exceptions to [facility] LLE Strength Left Hip Flexion: 4/5 Left Hip ABduction: 4/5 Left Hip ADduction: 4/5 Left Knee Flexion: 4/5 Left Knee Extension: 4/5 Left Ankle Dorsiflexion: 4-/5 Left Ankle Plantar Flexion: 4-/5  Care Tool Care Tool Bed Mobility Roll left and right activity   Roll left and right assist level: Supervision/Verbal cueing    Sit to lying activity   Sit to lying assist level: Moderate Assistance - Patient 50 - 74%    Lying to sitting on side of bed activity   Lying to sitting on side of bed assist level: the ability to move from lying on the back to sitting on the side of the bed with no back support.: Supervision/Verbal cueing     Care Tool Transfers Sit to stand transfer   Sit to stand assist level: Maximal Assistance - Patient 25 - 49%    Chair/bed transfer   Chair/bed transfer assist level: Maximal Assistance - Patient 25 - 49%     Psychologist, counselling transfer activity did not occur: Safety/medical concerns (fatigue, weakness, decreased balance/postural control)        Care Tool Locomotion Ambulation Ambulation activity did not occur: Safety/medical concerns (fatigue, weakness, decreased balance/postural control)        Walk 10 feet activity Walk 10 feet activity did not occur: Safety/medical concerns (fatigue, weakness, decreased balance/postural control)       Walk 50 feet with 2 turns activity Walk 50 feet with 2 turns activity did not occur: Safety/medical concerns (fatigue, weakness, decreased balance/postural control)      Walk 150 feet activity Walk 150 feet activity did not occur: Safety/medical concerns (fatigue, weakness, decreased balance/postural control)      Walk 10 feet  on uneven surfaces activity Walk 10 feet on uneven surfaces activity did not occur: Safety/medical concerns (fatigue, weakness, decreased balance/postural control)      Stairs Stair activity did not occur: Safety/medical concerns (fatigue, weakness, decreased balance/postural control)        Walk up/down 1 step activity Walk up/down 1 step or curb (drop down) activity did not occur: Safety/medical concerns (fatigue, weakness, decreased balance/postural control)      Walk up/down 4 steps activity Walk up/down 4 steps activity did not occur: Safety/medical concerns (fatigue, weakness, decreased balance/postural control)      Walk up/down 12 steps activity Walk up/down 12 steps activity did not occur: Safety/medical concerns (fatigue, weakness, decreased balance/postural control)      Pick up small objects from floor Pick up small object from the floor (from standing position) activity did not occur: Safety/medical concerns (fatigue, weakness, decreased balance/postural control)      Wheelchair Is the patient using a wheelchair?: Yes Type of Wheelchair: Manual   Wheelchair assist level: Supervision/Verbal cueing Max wheelchair distance: 51f  Wheel 50  feet with 2 turns activity   Assist Level: Maximal Assistance - Patient 25 - 49%  Wheel 150 feet activity   Assist Level: Dependent - Patient 0%    Refer to Care Plan for Long Term Goals  SHORT TERM GOAL WEEK 1 PT Short Term Goal 1 (Week 1): pt will transfer sit<>stand with LRAD and min A PT Short Term Goal 2 (Week 1): pt will transfer bed<>chair with LRAD and mod A consistantly PT Short Term Goal 3 (Week 1): pt will perform simulated car transfer with LRAD and mod A  Recommendations for other services: None   Skilled Therapeutic Intervention Evaluation completed (see details above and below) with education on PT POC and goals and individual treatment initiated with focus on functional mobility/transfers, generalized strengthening,  dynamic standing balance/coordination, and improved activity tolerance. Received pt supine in bed, pt educated on PT evaluation, CIR policies, and therapy schedule and agreeable. Pt denied any pain during session. Provided pt with 16x16 manual WC, cushion, and legrests. Pt transferred supine<>sitting EOB from flat bed using bedrails with supervision and scooted to EOB with supervision and increased time - mild posterior lean in sitting. Sit<>stand without AD and max A while guarding R knee from buckling. Returned to sitting and transferred bed<>WC stand/squat<>pivot without AD and max A with cues for technique and hand placement. Pt performed WC mobility 45f using BUE and supervision with hand over hand guidance for RUE placement on wheel - pt with mild R inattention running into wall but able to self correct and steer away from wall. Returned to room and [facility] (but required 2 attempts as pt's RLE going into mild flexor synergy limiting pt's ability to stand upright). [place] then [place] sit[place]. Concluded session with pt sitting in WC, needs within reach, and seatbelt alarm on. Safety plan updated.   Mobility Bed Mobility Bed Mobility: Rolling Right;Right Sidelying to Sit Rolling Right: Supervision/verbal cueing Right Sidelying to Sit: Supervision/Verbal cueing Supine to Sit: Contact Guard/Touching assist Sitting - Scoot to Edge of Bed: Supervision/Verbal cueing Transfers Transfers: Sit to Stand;Stand to Sit;Squat Pivot Transfers;Transfer via LGeophysicist/field seismologistSit to Stand: Maximal Assistance - Patient 25-49% Sit to Stand Comment: Blocking R knee due to buckling Stand to Sit: Moderate Assistance - Patient 50-74% Squat Pivot Transfers: Maximal Assistance - Patient 25-49% Transfer (Assistive device): None Transfer via Lift Equipment: SProbation officerAmbulation: No Gait Gait: No Stairs / Additional Locomotion Stairs: No WEngineer, manufacturing Yes Wheelchair Assistance: SChartered loss adjuster Both upper extremities Wheelchair Parts Management: Needs assistance Distance: 271f  Discharge Criteria: Patient will be discharged from PT if patient refuses treatment 3 consecutive times without medical reason, if treatment goals not met, if there is a change in medical status, if patient makes no progress towards goals or if patient is discharged from hospital.  The above assessment, treatment plan, treatment alternatives and goals were discussed and mutually agreed upon: by patient  AnAlfonse AlpersT, DPT  07/25/2021, 12:19 PM

## 2021-07-25 NOTE — Progress Notes (Signed)
Occupational Therapy Session Note  Patient Details  Name: Diane Webb MRN: [id number] Date of Birth: 11/25/1926  Today's Date: 07/25/2021 OT Individual Time: 4081-4481 OT Individual Time Calculation (min): 43 min    Short Term Goals: Week 1:  OT Short Term Goal 1 (Week 1): Pt will perform sit <> stand with mod A in prep for ADLs OT Short Term Goal 2 (Week 1): Pt will complete LB dressing with min A OT Short Term Goal 3 (Week 1): Pt will complete toilet transfer with mod A  Skilled Therapeutic Interventions/Progress Updates:  Skilled OT intervention completed with focus on functional transfers. Pt received seated in w/c agreeable to session with son in room. Pt's son with questions regarding his mother's CLOF, with explanation provided about OT purpose, POC and rehab goals, as well mechanism of a stroke and how it occurs. Assessed pt's sensation in detail, with pt unable to feel light touch on the dorsal and palmar side of RUE. Discussion about pt's ability to eat and level at baseline with opening containers etc, with pt reporting she needed help with containers at home due to arthritis. Pt educated about the option to use built up handle to decrease the amount of grip during gross grasping of small utensils. Completed 2 sit <> stands with max A using RW, with pt with decreased motor planning and R knee buckling during stand>sit with descent. Required cues to scoot forward in chair, lean forward and where to push up to stand, however once up was able to maintain standing with min A and R knee blocking for buckling. Pt left seated in w/c, with son in room, and set up with all needs in reach at end of session.  Therapy Documentation Precautions:  Precautions Precautions: Fall Precaution Comments: R hemiparesis; Pt's R knee has tendency to buckle during transfers Restrictions Weight Bearing Restrictions: No   Pain: Pt with no c/o pain during session.   Therapy/Group: Individual  Therapy  Hurbert Duran E Klutz 07/25/2021, 2:01 PM

## 2021-07-25 NOTE — Progress Notes (Signed)
PROGRESS NOTE   Subjective/Complaints: No complaints this morning Labs and H&P reviewed Patient appears comfortable BMI 35.99  ROS: + tingling in lower extremity   Objective:   No results found. Recent Labs    07/25/21 0516  WBC 8.1  HGB 11.9*  HCT 36.6  PLT 261   Recent Labs    07/25/21 0516  NA 138  K 4.1  CL 104  CO2 26  GLUCOSE 100*  BUN 25*  CREATININE 1.33*  CALCIUM 9.0    Intake/Output Summary (Last 24 hours) at 07/25/2021 0941 Last data filed at 07/25/2021 0900 Gross per 24 hour  Intake 480 ml  Output --  Net 480 ml        Physical Exam: Vital Signs Blood pressure (!) 146/78, pulse (!) 104, temperature 98.3 F (36.8 C), temperature source Oral, resp. rate 16, height 4\' 6"  (1.372 m), weight 67.7 kg, SpO2 91 %. Gen: no distress, normal appearing HEENT: oral mucosa pink and moist, NCAT Cardio: Tachycardia Chest: normal effort, normal rate of breathing Abd: soft, non-distended Ext: no edema Psych: pleasant, normal affect Skin: intact Neurologic: Cranial nerves II through XII intact, motor strength is 5/5 in left and 4/5 RIght  deltoid, bicep, tricep, grip, hip flexor, knee extensors, ankle dorsiflexor and plantar flexor Sensory exam normal sensation to light touch and proprioception in left upper and lower extremities, absent on right side  Sensory ataxia  Right finger nose finger and RIght heel shin Musculoskeletal: Full range of motion in all 4 extremities. No joint swelling   Assessment/Plan: 1. Functional deficits which require 3+ hours per day of interdisciplinary therapy in a comprehensive inpatient rehab setting. Physiatrist is providing close team supervision and 24 hour management of active medical problems listed below. Physiatrist and rehab team continue to assess barriers to discharge/monitor patient progress toward functional and medical goals  Care Tool:  Bathing    Body  parts bathed by patient: Right arm, Left arm, Chest, Abdomen, Right upper leg, Left upper leg, Left lower leg, Face   Body parts bathed by helper: Right lower leg, Front perineal area, Buttocks     Bathing assist Assist Level: Moderate Assistance - Patient 50 - 74%     Upper Body Dressing/Undressing Upper body dressing   What is the patient wearing?: Pull over shirt    Upper body assist Assist Level: Minimal Assistance - Patient > 75%    Lower Body Dressing/Undressing Lower body dressing      What is the patient wearing?: Pants     Lower body assist Assist for lower body dressing: Moderate Assistance - Patient 50 - 74%     Toileting Toileting    Toileting assist Assist for toileting: 2 Helpers     Transfers Chair/bed transfer  Transfers assist     Chair/bed transfer assist level: 2 Helpers     Locomotion Ambulation   Ambulation assist              Walk 10 feet activity   Assist           Walk 50 feet activity   Assist           Walk 150 feet  activity   Assist           Walk 10 feet on uneven surface  activity   Assist           Wheelchair     Assist               Wheelchair 50 feet with 2 turns activity    Assist            Wheelchair 150 feet activity     Assist          Blood pressure (!) 146/78, pulse (!) 104, temperature 98.3 F (36.8 C), temperature source Oral, resp. rate 16, height 4\' 6"  (1.372 m), weight 67.7 kg, SpO2 91 %.    Medical Problem List and Plan: 1.  Right side weakness secondary to left lateral thalamic infarct likely small vessel disease             -patient may  shower             -ELOS/Goals: 14-16d  Initial CIR therapies today 2.  Antithrombotics: -DVT/anticoagulation:  Pharmaceutical: Lovenox             -antiplatelet therapy: Aspirin 81 mg daily and Plavix 75 mg day x3 weeks then aspirin alone 3. Pain Management: Tylenol as needed 4. Mood: Melatonin 10 mg  nightly as needed sleep             -antipsychotic agents: N/A 5. Neuropsych: This patient is capable of making decisions on her own behalf. 6. Skin/Wound Care: Routine skin checks 7. Fluids/Electrolytes/Nutrition: Routine in and outs with follow-up chemistries 8.  Permissive hypertension.  Patient on losartan/HCTZ 100/25 daily prior to admission.  Resume as needed 9.  Hyperlipidemia.  Lipitor 10.  CKD stage III.  Baseline creatinine 1.52.  Cr reviewed and current better than baseline, monitor as needed.  11. Tachycardia: start coreg 3.125mg  daily 12. Obesity BMI 35.99: change diet to heart healthy/carb modified.   LOS: 1 days A FACE TO FACE EVALUATION WAS PERFORMED  Maryruth Apple 07/25/2021, 9:41 AM

## 2021-07-25 NOTE — Progress Notes (Signed)
Inpatient Rehabilitation Care Coordinator Assessment and Plan Patient Details  Name: Arneshia Ade MRN: [id number] Date of Birth: 05-Aug-1927  Today's Date: 07/25/2021  Hospital Problems: Principal Problem:   Acute stroke due to occlusion of left middle cerebral artery [facility]) Active Problems:   Left thalamic infarction [facility])  Past Medical History:  Past Medical History:  Diagnosis Date   Chronic kidney disease, stage 3b ([place]) 07/20/2021   Essential hypertension 12/01/2015   Mixed hyperlipidemia 12/01/2015   Past Surgical History: History reviewed. No pertinent surgical history. Social History:  reports that she has never smoked. She has never used smokeless tobacco. No history on file for alcohol use and drug use.  Family / Support Systems Children: Network engineer (Daughter) Anticipated Caregiver: Pearletha Alfred Ability/Limitations of Caregiver: none Caregiver Availability: 24/7 Family Dynamics: support from daughter  Social History Preferred language: English Religion: None Health Literacy - How often do you need to have someone help you when you read instructions, pamphlets, or other written material from your doctor or pharmacy?: Sometimes Writes: Yes Employment Status: Unemployed Public relations account executive Issues: n/a Guardian/Conservator: Network engineer   Abuse/Neglect Abuse/Neglect Assessment Can Be Completed: Yes Physical Abuse: Denies Verbal Abuse: Denies Sexual Abuse: Denies Exploitation of patient/patient's resources: Denies Self-Neglect: Denies  Patient response to: Social Isolation - How often do you feel lonely or isolated from those around you?: Never  Emotional Status Recent Psychosocial Issues: n/a Psychiatric History: n/a Substance Abuse History: n/a  Patient / Family Perceptions, Expectations & Goals Pt/Family understanding of illness & functional limitations: yes Premorbid pt/family roles/activities: Previously only out for appointments. Some  assistance Anticipated changes in roles/activities/participation: Daughter able to assist Pt/family expectations/goals: MOD I to [place]: None Premorbid Home Care/DME Agencies: Other (Comment) Financial trader, Marketing executive, Civil engineer, contracting) Transportation available at discharge: family able to transport Is the patient able to respond to transportation needs?: Yes In the past 12 months, has lack of transportation kept you from medical appointments or from getting medications?: No In the past 12 months, has lack of transportation kept you from meetings, work, or from getting things needed for daily living?: No  Discharge Planning Living Arrangements: Children Support Systems: Children Type of Residence: Private residence Insurance Resources: Chartered certified accountant Resources: Family Support Financial Screen Referred: No Living Expenses: Lives with family Money Management: Family Does the patient have any problems obtaining your medications?: No Home Management: Independent previosuly Patient/Family Preliminary Plans: Family able to assit with money and medication management Care Coordinator Anticipated Follow Up Needs: HH/OP Expected length of stay: 14-16 Days  Clinical Impression SW met with pt and family member in patient room, introduced self, explained role and addressed questions or concerns. SW left VM for pt daughter, provided detailed info and SW information. Pt plans to discharge back home with family to care. No additional questions or concerns, sw will follow up on tomorrow with updates.   [name] 07/25/2021, 1:23 PM

## 2021-07-25 NOTE — Progress Notes (Signed)
Inpatient Rehabilitation  Patient information reviewed and entered into eRehab system by Yuleni Burich Abraham Entwistle, OTR/L.   Information including medical coding, functional ability and quality indicators will be reviewed and updated through discharge.    

## 2021-07-25 NOTE — Evaluation (Signed)
Occupational Therapy Assessment and Plan  Patient Details  Name: Diane Webb MRN: [id number] Date of Birth: 10-31-1926  OT Diagnosis: abnormal posture, acute pain, apraxia, blindness and low vision, hemiplegia affecting dominant side, muscular wasting and disuse atrophy, and muscle weakness (generalized) Rehab Potential: Rehab Potential (ACUTE ONLY): Fair ELOS: about 3 weeks  Today's Date: 07/25/2021 OT Individual Time: 0802-0900 OT Individual Time Calculation (min): 58 min     Hospital Problem: Principal Problem:   Acute stroke due to occlusion of left middle cerebral artery ([place]) Active Problems:   Left thalamic infarction [facility])   Past Medical History:  Past Medical History:  Diagnosis Date   Chronic kidney disease, stage 3b ([place]) 07/20/2021   Essential hypertension 12/01/2015   Mixed hyperlipidemia 12/01/2015   Past Surgical History: History reviewed. No pertinent surgical history.  Assessment & Plan Clinical Impression: Pt is a 87 year old female with medical hx significant for: CKD 3, HTN, HLD. Pt presented to hospital on 10/19 d/t right sided weakness of arm and leg and not being able to see out of R eye. Pt was not eligible for acute intervention. CT showed hypodensity in left thalamus. MRI revealed left lateral thalamic stroke. MRA head and neck showed severe right MCA M2 stenosis, moderate right PCA P2 and mild right MCA M1 stenosis. No large vessel occlusion revealed. 2D echo shoed EF of 60%. Therapy evaluations completed and CIR recommended d/t pt's deficits in functional mobility and ability to complete ADLs independently. Patient transferred to CIR on 07/24/2021 .    Patient currently requires mod A with basic self-care skills secondary to muscle weakness and muscle paralysis, decreased cardiorespiratoy endurance, impaired timing and sequencing, motor apraxia, decreased coordination, and decreased motor planning, decreased visual acuity and decreased visual perceptual  skills, decreased motor planning, and decreased initiation, decreased awareness, decreased safety awareness, and decreased memory.  Prior to hospitalization, patient could complete all basic ADLs modified independence.  Patient will benefit from skilled intervention to decrease level of assist with basic self-care skills and increase independence with basic self-care skills prior to discharge with pt's daughter.  Anticipate patient will require 24 hour supervision and minimal physical assistance and follow up home health.  OT - End of Session Activity Tolerance: Tolerates 10 - 20 min activity with multiple rests Endurance Deficit: Yes Endurance Deficit Description: Pt with fatigue during ADL tasks at EOB and multiple sit>stand transfers OT Assessment Rehab Potential (ACUTE ONLY): Fair OT Barriers to Discharge: Home environment access/layout;Lack of/limited family support OT Patient demonstrates impairments in the following area(s): Motor;Perception;Safety;Sensory;Skin Integrity;Vision;Balance;Endurance OT Basic ADL's Functional Problem(s): Eating;Grooming;Bathing;Dressing;Toileting OT Advanced ADL's Functional Problem(s): Other (comment) (pt reports her only IADL she completed was making her bed) OT Transfers Functional Problem(s): Toilet;Other (comment) (walk in shower transfer) OT Additional Impairment(s): Fuctional Use of Upper Extremity OT Plan OT Intensity: Minimum of 1-2 x/day, 45 to 90 minutes OT Frequency: 5 out of 7 days OT Treatment/Interventions: Balance/vestibular training;DME/adaptive equipment instruction;Patient/family education;Therapeutic Activities;Wheelchair propulsion/positioning;Therapeutic Exercise;Community reintegration;Functional mobility training;Self Care/advanced ADL retraining;UE/LE Strength taining/ROM;Discharge planning;UE/LE Coordination activities;Pain management;Disease mangement/prevention;Visual/perceptual remediation/compensation OT Recommendation Patient  destination: Home Follow Up Recommendations: Home health OT Equipment Recommended: To be determined Equipment Details: Pt reports having a BSC and shower seat at home in walk in shower   OT Evaluation Precautions/Restrictions  Precautions Precautions: Fall Precaution Comments: Pt's R knee has tendency to buckle during transfers Restrictions Weight Bearing Restrictions: No Pain Pain Assessment Pain Scale: 0-10 Pain Score: 0-No pain Home Living/Prior Functioning Home Living Family/patient expects to be  discharged to:: Private residence Living Arrangements: Children Available Help at Discharge: Family, Available 24 hours/day Type of Home: House Home Access: Stairs to enter [employer] of Steps: Reports a couple steps to enter maybe 2-3 Home Layout: One level Bathroom Shower/Tub: Multimedia programmer: Handicapped height Bathroom Accessibility: Yes  Lives With: Daughter IADL History Homemaking Responsibilities: No Current License: No Type of Occupation: Retired Leisure and Hobbies: Spending time with daughter, getting nails done IADL Comments: Pt reports daughter was completing all IADLs for her Prior Function Level of Independence: Independent with transfers, Other (comment), Independent with basic ADLs, Requires assistive device for independence, Needs assistance with homemaking Meal Prep: Other (comment) (pt reports daughter was completing meal prep tasks for her PTA) Laundry: Other (comment) (pt reports daughter was completing laundry tasks for her PTA) Light Housekeeping: Other (comment) (pt reports daughter was completing all house cleaning tasks for her PTA)  Able to Take Stairs?:  (Pt reports using rollator for all transfers, but was unclear on entry way to home if stairs or not, so unclear on ability to take steps) Driving: No Vocation: Retired Surveyor, mining Baseline Vision/History: 1 Wears glasses (Reports she had cataracts removed, is supposed to wear  her glasses but doesn't and doesn't have them with her) Ability to See in Adequate Light: 1 Impaired Patient Visual Report: No change from baseline Vision Assessment?: Yes Eye Alignment: Within Functional Limits Ocular Range of Motion: Within Functional Limits Alignment/Gaze Preference: Within Defined Limits Tracking/Visual Pursuits: Decreased smoothness of eye movement to RIGHT inferior field;Decreased smoothness of eye movement to LEFT inferior field;Decreased smoothness of vertical tracking;Decreased smoothness of horizontal tracking Saccades: Overshoots;Other (comment) (Pt unable to sustain focus on visual stimuli during saccades) Visual Fields: No apparent deficits Additional Comments: Pt's vision was [facility] for ADL tasks, however during vision assessment, pt presents with decreased smoothness during tracking in all fields as well as decreased ability to sustain focus on visual stimuli during saccades Perception  Perception: Impaired Spatial Orientation: Pt presented with overshooting during ADL tasks, with several spills during bathing and grooming tasks, however with report of feeling like R arm is "woosy feeling" Praxis Praxis: Impaired Praxis Impairment Details: Motor planning Praxis-Other Comments: Requires increased time to motor plan with decreased accuracy in motor movements Cognition Overall Cognitive Status: Within Functional Limits for tasks assessed Arousal/Alertness: Awake/alert Orientation Level: Person;Place;Situation Person: Oriented Place: Oriented Situation: Oriented Year: 2021 Month: October Day of Week: Correct Memory: Appears intact Immediate Memory Recall: Sock;Blue;Bed Memory Recall Sock: Without Cue Memory Recall Blue: Without Cue Memory Recall Bed: Without Cue Awareness: Appears intact Problem Solving: Impaired Executive Function: Sequencing Sequencing: Impaired Safety/Judgment: Impaired Risk analyst Light Touch: Impaired by gross  assessment Proprioception: Impaired by gross assessment Additional Comments: Pt reports the inability to feel sensations on RUE from the forearm down and reports numbness and tingling in digits. Was unable to feel therapist taking off tape/bandage off RUE on forearm and wrist Coordination Gross Motor Movements are Fluid and Coordinated: No Fine Motor Movements are Fluid and Coordinated: No Coordination and Movement Description: Decreased smoothness and precision, with increased time required Finger Nose Finger Test: Impaired- pt's RUE is apraxic, with L>R in precision Motor  Motor Motor: Hemiplegia;Abnormal tone Motor - Skilled Clinical Observations: grossly uncoordinated due to R hemiparesis, generalized weakness/deconditioning, decreased balance/postural control, and impaired motor planning/sequencing  Trunk/Postural Assessment  Cervical Assessment Cervical Assessment: Exceptions to [facility] (kyphosis) Thoracic Assessment Thoracic Assessment: Exceptions to [facility] (kyphosis) Lumbar Assessment Lumbar Assessment: Exceptions to [facility] (posterior pelvic tilt) Postural  Control Postural Control: Deficits on evaluation (posterior lean in sitting)  Balance Balance Balance Assessed: Yes Static Sitting Balance Static Sitting - Balance Support: Feet supported;Bilateral upper extremity supported Static Sitting - Level of Assistance: 5: Stand by assistance (supervision) Dynamic Sitting Balance Dynamic Sitting - Balance Support: Feet supported;No upper extremity supported Dynamic Sitting - Level of Assistance: 5: Stand by assistance (CGA) Static Standing Balance Static Standing - Balance Support: Right upper extremity supported (HHA) Static Standing - Level of Assistance: 3: Mod assist Dynamic Standing Balance Dynamic Standing - Balance Support: Right upper extremity supported (HHA) Dynamic Standing - Level of Assistance: 2: Max assist Dynamic Standing - Comments: with transfers Extremity/Trunk  Assessment RUE Assessment RUE Assessment: Exceptions to [facility] Active Range of Motion (AROM) Comments: R<L during functional tasks, however reports at baseline General Strength Comments: 3+/5, R<L during functional tasks, reports BUE were weak at baseline with compensatory strategies used during ADLs PTA LUE Assessment LUE Assessment: Exceptions to [facility] Active Range of Motion (AROM) Comments: L>R during functional tasks, with pt reporting at her baseline for LUE General Strength Comments: 4-/5, L>R during functional tasks  Care Tool Care Tool Self Care Eating   Eating Assist Level: Set up assist    Oral Care    Oral Care Assist Level: Minimal Assistance - Patient > 75%    Bathing   Body parts bathed by patient: Right arm;Left arm;Chest;Abdomen;Right upper leg;Left upper leg;Left lower leg;Face Body parts bathed by helper: Right lower leg;Front perineal area;Buttocks   Assist Level: Moderate Assistance - Patient 50 - 74%    Upper Body Dressing(including orthotics)   What is the patient wearing?: Pull over shirt   Assist Level: Minimal Assistance - Patient > 75%    Lower Body Dressing (excluding footwear)   What is the patient wearing?: Pants Assist for lower body dressing: Moderate Assistance - Patient 50 - 74%    Putting on/Taking off footwear   What is the patient wearing?: Non-skid slipper socks Assist for footwear: Moderate Assistance - Patient 50 - 74%       Care Tool Toileting Toileting activity   Assist for toileting: Maximal Assistance - Patient 25 - 49%     Care Tool Bed Mobility Roll left and right activity   Roll left and right assist level: Minimal Assistance - Patient > 75%    Sit to lying activity   Sit to lying assist level: Moderate Assistance - Patient 50 - 74%    Lying to sitting on side of bed activity   Lying to sitting on side of bed assist level: the ability to move from lying on the back to sitting on the side of the bed with no back support.:  Moderate Assistance - Patient 50 - 74%     Care Tool Transfers Sit to stand transfer   Sit to stand assist level: Maximal Assistance - Patient 25 - 49%    Chair/bed transfer   Chair/bed transfer assist level: Maximal Assistance - Patient 25 - 49%     Toilet transfer   Assist Level: Maximal Assistance - Patient 24 - 49%     Care Tool Cognition  Expression of Ideas and Wants Expression of Ideas and Wants: 3. Some difficulty - exhibits some difficulty with expressing needs and ideas (e.g, some words or finishing thoughts) or speech is not clear  Understanding Verbal and Non-Verbal Content Understanding Verbal and Non-Verbal Content: 3. Usually understands - understands most conversations, but misses some part/intent of message. Requires cues at times  to understand   Memory/Recall Ability Memory/Recall Ability : Current season;That he or she is in a hospital/hospital unit   Refer to Care Plan for [place] 1 OT Short Term Goal 1 (Week 1): Pt will perform sit <> stand with mod A in prep for ADLs OT Short Term Goal 2 (Week 1): Pt will complete LB dressing with min A OT Short Term Goal 3 (Week 1): Pt will complete toilet transfer with mod A  Recommendations for other services: None    Skilled Therapeutic Intervention Skilled OT intervention completed with education on rehab process, purpose of OT, and POC. Pt received awake upright in bed, agreeable to session. Pt able to complete bed mobility with min A for scooting EOB. Completed EOB self-care tasks with set up for UB bathing, min A for UB dressing, and mod A for LB bathing/dressing at the sit > stand level with mod assist needed for transfer. Pt able to utilize figure four position with LLE on RLE to complete donning of socks, and demonstrated method of donning weaker RLE first into pants, un-cued. Pt presents with apraxia in RUE, and decreased fine motor control and coordination as pt had a couple episodes of  spills during self-care tasks due to lack of R hand control. Pt requires increased time for motor planning tasks. Pt was able to sit EOB without any LOB, however pt did have tendency to lean on R forearm, supported, vs needing assist to sit upright. Pt opted to get back into bed for grooming tasks vs recliner. Pt required min A for EOB > supine in bed, to manage RLE and scoot up in bed for position of comfort. Pt required min A for cleaning dentures, with pt presenting decreased fine motor and gross grasp control while manipulating toothbrush and dentures simultaneously. Pt reports eating this AM was difficult with several food spillages. Pt left in in long sitting in bed, with alarm on bed activated, and all needs left on pt's intact side for immediate needs.   ADL ADL Grooming: Minimal assistance Where Assessed-Grooming: Edge of bed Upper Body Bathing: Minimal assistance Where Assessed-Upper Body Bathing: Edge of bed Lower Body Bathing: Moderate assistance Where Assessed-Lower Body Bathing: Edge of bed Upper Body Dressing: Minimal assistance Where Assessed-Upper Body Dressing: Edge of bed Lower Body Dressing: Moderate assistance Where Assessed-Lower Body Dressing: Edge of bed Toileting: Maximal assistance Where Assessed-Toileting: Bedside Commode ADL Comments: Pt able to complete UB bathing/dressing with min A due to BUE limited AROM and R >L sided weakness and decreased cooridination, presents with R knee buckling during transfers using RW and able to complete LB bathing/dressing at mod A Mobility  Bed Mobility Bed Mobility: Supine to Sit;Sitting - Scoot to Edge of Bed Supine to Sit: Contact Guard/Touching assist Sitting - Scoot to Edge of Bed: Minimal Assistance - Patient > 75% Transfers Sit to Stand: Moderate Assistance - Patient 50-74%;Other/comment   Discharge Criteria: Patient will be discharged from OT if patient refuses treatment 3 consecutive times without medical reason, if  treatment goals not met, if there is a change in medical status, if patient makes no progress towards goals or if patient is discharged from hospital.  The above assessment, treatment plan, treatment alternatives and goals were discussed and mutually agreed upon: by patient  Haywood Lasso 07/25/2021, 9:45 AM

## 2021-07-25 NOTE — Progress Notes (Signed)
Inpatient Rehabilitation Center Individual Statement of Services  Patient Name:  Diane Webb  Date:  07/25/2021  Welcome to the Inpatient Rehabilitation Center.  Our goal is to provide you with an individualized program based on your diagnosis and situation, designed to meet your specific needs.  With this comprehensive rehabilitation program, you will be expected to participate in at least 3 hours of rehabilitation therapies Monday-Friday, with modified therapy programming on the weekends.  Your rehabilitation program will include the following services:  Physical Therapy (PT), Occupational Therapy (OT), Speech Therapy (ST), 24 hour per day rehabilitation nursing, Therapeutic Recreaction (TR), Neuropsychology, Care Coordinator, Rehabilitation Medicine, Nutrition Services, Pharmacy Services, and Other  Weekly team conferences will be held on Wednesdays to discuss your progress.  Your Inpatient Rehabilitation Care Coordinator will talk with you frequently to get your input and to update you on team discussions.  Team conferences with you and your family in attendance may also be held.  Expected length of stay: 14-16 Days  Overall anticipated outcome: MOD I to Supervision  Depending on your progress and recovery, your program may change. Your Inpatient Rehabilitation Care Coordinator will coordinate services and will keep you informed of any changes. Your Inpatient Rehabilitation Care Coordinator's name and contact numbers are listed  below.  The following services may also be recommended but are not provided by the Inpatient Rehabilitation Center:   Home Health Rehabiltiation Services Outpatient Rehabilitation Services    Arrangements will be made to provide these services after discharge if needed.  Arrangements include referral to agencies that provide these services.  Your insurance has been verified to be:  Medicare A & B Your primary doctor is:  Feliciana Rossetti, MD  Pertinent  information will be shared with your doctor and your insurance company.  Inpatient Rehabilitation Care Coordinator:  Lavera Guise, [place] [phone] or [phone]  Information discussed with and copy given to patient by: [name], 07/25/2021, 11:01 AM

## 2021-07-25 NOTE — Progress Notes (Signed)
Physical Therapy Session Note  Patient Details  Name: Diane Webb MRN: [id number] Date of Birth: 1926-11-25  Today's Date: 07/25/2021 PT Individual Time: 1415-1455 PT Individual Time Calculation (min): 40 min   Short Term Goals: Week 1:  PT Short Term Goal 1 (Week 1): pt will transfer sit<>stand with LRAD and min A PT Short Term Goal 2 (Week 1): pt will transfer bed<>chair with LRAD and mod A consistantly PT Short Term Goal 3 (Week 1): pt will perform simulated car transfer with LRAD and mod A  Skilled Therapeutic Interventions/Progress Updates:   Received pt sitting in [facility] with son present at bedside. Pt agreeable to PT treatment and denied any pain during session. Session with emphasis on functional mobility/transfers, generalized strengthening, dynamic standing balance/coordination, NMR, and improved activity tolerance. Pt transported to/from room on 5C in [facility] total A for time management purposes. Pt performed x 2 stand/squat<>pivot transfers to/from mat to L with mod/max A and cues for sequencing/technique. Worked on blocked practice sit<>stands with RUE supported around therapist and blocking R knee using mirror for visual feedback x 5 reps with mod A overall - cues for eccentric control when sitting to avoid "plopping" and for weight shifting to L to correct mild R lean. Pt with significant R knee crepitus with transfers and with 1 instance of R knee buckling resulting in posterior descent onto mat. Pt then performed x 6 mini squats from standing position with RUE around therapist and R knee blocked with emphasis on quad strength. Pt requested to return to bed at end of session and transferred WC<>bed stand<>pivot to R but R knee buckled and pt began sliding anteriorly - required total A to scoot hips back onto bed safely. Sit<>supine with supervision and cues to attend to RLE as pt did not notice it was hanging off bed. Pt scooted up on bed with supervision, verbal cues, and use of  Trendelenburg bed position. Concluded session with pt semi-reclined in bed, needs within reach, and bed alarm on. Pt's son present at bedside.   Therapy Documentation Precautions:  Restrictions Weight Bearing Restrictions: No  Therapy/Group: Individual Therapy Martin Majestic PT, DPT   07/25/2021, 7:20 AM

## 2021-07-26 DIAGNOSIS — I63512 Cerebral infarction due to unspecified occlusion or stenosis of left middle cerebral artery: Secondary | ICD-10-CM | POA: Diagnosis not present

## 2021-07-26 NOTE — Progress Notes (Signed)
Physical Therapy Session Note  Patient Details  Name: Diane Webb MRN: [id number] Date of Birth: 1927/08/05  Today's Date: 07/26/2021 PT Individual Time: 7253-6644 PT Individual Time Calculation (min): 39 min   Short Term Goals: Week 1:  PT Short Term Goal 1 (Week 1): pt will transfer sit<>stand with LRAD and min A PT Short Term Goal 2 (Week 1): pt will transfer bed<>chair with LRAD and mod A consistantly PT Short Term Goal 3 (Week 1): pt will perform simulated car transfer with LRAD and mod A  Skilled Therapeutic Interventions/Progress Updates:   Received pt sitting in WC, pt agreeable to PT treatment, and denied any pain during session but reported her head felt "heavy". Session with emphasis on functional mobility/transfers, generalized strengthening, dynamic standing balance/coordination, gait training, and improved activity tolerance. Doffed non-skid socks and donned shoes with max A. Pt transported to/from room in [facility] total A for time management purposes. Sit<>stand with L railing in hallway with mod A x 3 trials with RUE supported around therapist and guarding R knee. Pt ambulated 33ft x 1 and 19ft x 2 trials with L railing and mod A +2 for close WC follow using mirror for visual feedback - guarding R knee from buckling but only 2-3 instances of buckling throughout. Pt with 1 major instance of R knee buckling when standing from WC resulting in posterior LOB into WC. Pt required min manual facilitation to advance and place RLE and verbal cues for upright posture and to increase RLE step length. Pt then performed WC mobility 50ft using BUE and supervision with emphasis on hamstring strengthening. Concluded session with pt sitting in WC, needs within reach, and seatbelt alarm on.   Therapy Documentation Precautions:  Precautions Precautions: Fall Precaution Comments: R hemiparesis; Pt's R knee has tendency to buckle during transfers Restrictions Weight Bearing Restrictions:  No  Therapy/Group: Individual Therapy Martin Majestic PT, DPT   07/26/2021, 7:30 AM

## 2021-07-26 NOTE — Progress Notes (Signed)
PROGRESS NOTE   Subjective/Complaints: Team conference today Seen ambulating in hall with [name].  Tachycardia much better controlled with low dose Coreg. Hgb decreased, continue to monitor  ROS: + tingling in lower extremity, +loose stool   Objective:   No results found. Recent Labs    07/25/21 0516  WBC 8.1  HGB 11.9*  HCT 36.6  PLT 261   Recent Labs    07/25/21 0516  NA 138  K 4.1  CL 104  CO2 26  GLUCOSE 100*  BUN 25*  CREATININE 1.33*  CALCIUM 9.0    Intake/Output Summary (Last 24 hours) at 07/26/2021 1058 Last data filed at 07/26/2021 0700 Gross per 24 hour  Intake 580 ml  Output 101 ml  Net 479 ml        Physical Exam: Vital Signs Blood pressure 137/62, pulse 86, temperature 98.2 F (36.8 C), temperature source Oral, resp. rate 16, height 4\' 6"  (1.372 m), weight 67.7 kg, SpO2 100 %. Gen: no distress, normal appearing HEENT: oral mucosa pink and moist, NCAT Cardio: Reg rate Chest: normal effort, normal rate of breathing Abd: soft, non-distended Ext: no edema Psych: pleasant, normal affect Skin: intact Neurologic: Cranial nerves II through XII intact, motor strength is 5/5 in left and 4/5 RIght  deltoid, bicep, tricep, grip, hip flexor, knee extensors, ankle dorsiflexor and plantar flexor Sensory exam normal sensation to light touch and proprioception in left upper and lower extremities, absent on right side  Sensory ataxia  Right finger nose finger and RIght heel shin Musculoskeletal: Full range of motion in all 4 extremities. No joint swelling   Assessment/Plan: 1. Functional deficits which require 3+ hours per day of interdisciplinary therapy in a comprehensive inpatient rehab setting. Physiatrist is providing close team supervision and 24 hour management of active medical problems listed below. Physiatrist and rehab team continue to assess barriers to discharge/monitor patient progress  toward functional and medical goals  Care Tool:  Bathing    Body parts bathed by patient: Right arm, Left arm, Chest, Abdomen, Right upper leg, Left upper leg, Left lower leg, Face   Body parts bathed by helper: Right lower leg, Front perineal area, Buttocks     Bathing assist Assist Level: Moderate Assistance - Patient 50 - 74%     Upper Body Dressing/Undressing Upper body dressing   What is the patient wearing?: Pull over shirt    Upper body assist Assist Level: Minimal Assistance - Patient > 75%    Lower Body Dressing/Undressing Lower body dressing      What is the patient wearing?: Pants, Incontinence brief     Lower body assist Assist for lower body dressing: 2 Helpers     Toileting Toileting    Toileting assist Assist for toileting: Maximal Assistance - Patient 25 - 49%     Transfers Chair/bed transfer  Transfers assist     Chair/bed transfer assist level: Maximal Assistance - Patient 25 - 49%     Locomotion Ambulation   Ambulation assist   Ambulation activity did not occur: Safety/medical concerns (fatigue, weakness, decreased balance/postural control)  Assist level: 2 helpers Assistive device: Other (comment) (L handrail) Max distance: 39ft   Walk  10 feet activity   Assist  Walk 10 feet activity did not occur: Safety/medical concerns (fatigue, weakness, decreased balance/postural control)  Assist level: 2 helpers Assistive device: Other (comment) (L handrail)   Walk 50 feet activity   Assist Walk 50 feet with 2 turns activity did not occur: Safety/medical concerns (fatigue, weakness, decreased balance/postural control)         Walk 150 feet activity   Assist Walk 150 feet activity did not occur: Safety/medical concerns (fatigue, weakness, decreased balance/postural control)         Walk 10 feet on uneven surface  activity   Assist Walk 10 feet on uneven surfaces activity did not occur: Safety/medical concerns (fatigue,  weakness, decreased balance/postural control)         Wheelchair     Assist Is the patient using a wheelchair?: Yes Type of Wheelchair: Manual    Wheelchair assist level: Supervision/Verbal cueing Max wheelchair distance: 20ft    Wheelchair 50 feet with 2 turns activity    Assist        Assist Level: Supervision/Verbal cueing   Wheelchair 150 feet activity     Assist      Assist Level: Dependent - Patient 0%   Blood pressure 137/62, pulse 86, temperature 98.2 F (36.8 C), temperature source Oral, resp. rate 16, height 4\' 6"  (1.372 m), weight 67.7 kg, SpO2 100 %.    Medical Problem List and Plan: 1.  Right side weakness secondary to left lateral thalamic infarct likely small vessel disease             -patient may  shower             -ELOS/Goals: 14-16d  -Interdisciplinary Team Conference today   2.  Impaired mobility: continue Lovenox             -antiplatelet therapy: Aspirin 81 mg daily and Plavix 75 mg day x3 weeks then aspirin alone 3. Pain Management: Tylenol as needed 4. Insomnia: continue Melatonin 10 mg nightly as needed sleep             -antipsychotic agents: N/A 5. Neuropsych: This patient is capable of making decisions on her own behalf. 6. Skin/Wound Care: Routine skin checks 7. Fluids/Electrolytes/Nutrition: Routine in and outs with follow-up chemistries 8.  Permissive hypertension.  Patient on losartan/HCTZ 100/25 daily prior to admission.  Resume as needed 9.  Hyperlipidemia.  Continue Lipitor 10.  CKD stage III.  Baseline creatinine 1.52.  Cr reviewed and current better than baseline, monitor as needed.  11. Tachycardia: continue coreg 3.125mg  daily, improved 12. Obesity BMI 35.99: change diet to heart healthy/carb modified.   LOS: 2 days A FACE TO FACE EVALUATION WAS PERFORMED  Khadir Roam 07/26/2021, 10:58 AM

## 2021-07-26 NOTE — Progress Notes (Signed)
Occupational Therapy Session Note  Patient Details  Name: Diane Webb MRN: [id number] Date of Birth: 12-12-1926  Today's Date: 07/26/2021 OT Individual Time: 1165-7903 OT Individual Time Calculation (min): 38 min    Short Term Goals: Week 1:  OT Short Term Goal 1 (Week 1): Pt will perform sit <> stand with mod A in prep for ADLs OT Short Term Goal 2 (Week 1): Pt will complete LB dressing with min A OT Short Term Goal 3 (Week 1): Pt will complete toilet transfer with mod A  Skilled Therapeutic Interventions/Progress Updates:    Pt received semi-reclined in bed, denies pain, agreeable to therapy. Session focus on self-care retraining, activity tolerance, functional transfers, functional use of RUE in prep for improved ADL/IADL/func mobility performance + decreased caregiver burden. Came to sitting EOB with CGA and use of bed features. Squat-pivot to w/c on her R with total A, difficulty clearing hips over w/c cushion requiring increased time to push-up from BUE to prevent anterior sliding. Completed UB bathing with S seated at sink, able to incorporate R hand into bathing with encouragement. Completed UB dressing with min A to pull shirt down in back. Bathed BLE with S, as well. Declined pericare as reports just having gone to bathroom with nursing, would required max A for sit to stand to bathe buttocks. Completed oral care while seated with S, as well. Donned pants with total A, able to thread LLE and pull over B knees to assist. Multiple attempts at sit to stand with RW with mod to max A to power up, but unable to achieve fully upright posture. Several instances of R knee buckling as pt attempts to put LUE on RW and increase WB to RLE. Denies any pain. Able to power up more easily with no AD and therapist in front to block R knee in order to complete LB dressing.  Pt left seated in w/c with safety belt alarm engaged, call bell in reach, and all immediate needs met.    Therapy  Documentation Precautions:  Precautions Precautions: Fall Precaution Comments: R hemiparesis; Pt's R knee has tendency to buckle during transfers Restrictions Weight Bearing Restrictions: No  Pain: Pain Assessment Pain Scale: 0-10 Pain Score: 0-No pain ADL: See Care Tool for more details.   Therapy/Group: Individual Therapy  Volanda Napoleon MS, OTR/L  07/26/2021, 6:42 AM

## 2021-07-26 NOTE — Progress Notes (Signed)
Occupational Therapy Session Note  Patient Details  Name: Diane Webb MRN: [id number] Date of Birth: 10/13/1926  Today's Date: 07/26/2021 OT Individual Time: 4514-6047 OT Individual Time Calculation (min): 60 min    Short Term Goals: Week 1:  OT Short Term Goal 1 (Week 1): Pt will perform sit <> stand with mod A in prep for ADLs OT Short Term Goal 2 (Week 1): Pt will complete LB dressing with min A OT Short Term Goal 3 (Week 1): Pt will complete toilet transfer with mod A  Skilled Therapeutic Interventions/Progress Updates:  Pt greeted seated in w/c  agreeable to OT intervention. Session focus on various therapeutic activities focused on RUE coordination, sit<>stands, and RUE NMR as well as BADL reeducation. Pt continues to present with impaired RUE strength and coordination, impaired standing balance and RLE weakness resulting in RLE buckling during transfers.  Pt transported to gym with total A for time mgmt. Attempted stand pivot from w/c>EOM with MAX A however pt unable to pivot RLE, pt completed lateral scoot transfer from w/c>EOM with MOD A. Worked on sit<>stands from mat with use of mirror for visual feedback with OTA positioned on pts R side with pts RUE around OTA neck and RLE stabilized with OTA knees. Pt completed x2 sit<>stands from EOM with MAX A. Pt with difficulty shifting weight anteriorly and elevating trunk into standing, pt additionally noted to buckle in R knee with crepitus noted in R knee during sit<>stands. Worked on lateral leans to R side to provide NMR to RUE pt completed x10 reps with CGA. Pt additionally completed therapeutic activity where pt instructed to reach out of BOS with RUE to target to facilitate improved dynamic sitting balance and improved coordination in RUE. Pt required CGA to complete task. Worked on scapular protraction/retraction with RUE positioned on incline with pt instructed to push RUE against gravity for 1 min to facilitate improved AROM, NMR and  coordination in RUE. Pt able to complete scapular protraction/retraction with active assist from un weighted dowel rod x10 reps as well as circumduction forward/backward x10 reps each. Pt transported back to room in w/c with total A where pt reported need to void bladder. Attempted squat pivot transfer from w/c>BSC however pt unable  to pivot feet with RLE buckling.  Pt sit<>stand from w/c with MAX A while RN switched out w/c for [facility]. Pt able to complete anterior pericare with set- up assist but needed MOD A overall for 3/3 toileting tasks.  pt left   seated in w/c with alarm belt activated and all needs within reach.                     Therapy Documentation Precautions:  Precautions Precautions: Fall Precaution Comments: R hemiparesis; Pt's R knee has tendency to buckle during transfers Restrictions Weight Bearing Restrictions: No  Pain: pt reports no pain during session.     Therapy/Group: Individual Therapy  Pollyann Glen [facility] 07/26/2021, 12:30 PM

## 2021-07-26 NOTE — Progress Notes (Incomplete)
Patient had episode of wheezing while assessing her and providing ADL care,She states she was told that she could get and inhaler for this problem, Will update morning assigned nurse and monitor closely.Noted patient is voiding small amount of urine when asked to void, po liquids ( water), encourage to try and consume more water, states she does not like water but will try. Monitor and encouraged

## 2021-07-26 NOTE — Progress Notes (Signed)
Chaplain responded to consult to complete advance directive. Chaplain notified that pt has completed documentation in her chart-just needs to be notarized. Chaplain confirmed that pt does have a living will and health care power of attorney documentation completed. Chaplain contacted notary and witnesses to verify pt signing. Documents completed, 3 copies made, one placed in pt's chart for filing, original and remaining copies given to pt.  Please page as further needs arise.  Maryanna Shape. Carley Hammed, M.Div. [facility] Chaplain Pager [phone] Office [phone]     07/26/21 1200  Clinical Encounter Type  Visited With Patient and family together  Visit Type Follow-up  Advance Directives (For Healthcare)  Does Patient Have a Medical Advance Directive? No

## 2021-07-26 NOTE — Patient Care Conference (Signed)
Inpatient RehabilitationTeam Conference and Plan of Care Update Date: 07/26/2021   Time: 11:35 AM    Patient Name: Diane Webb      Medical Record Number: [id number]  Date of Birth: 1927-03-08 Sex: Female         Room/Bed: 5C04C/5C04C-01 Payor Info: Payor: MEDICARE / Plan: MEDICARE PART A AND B / Product Type: *No Product type* /    Admit Date/Time:  07/24/2021  3:12 PM  Primary Diagnosis:  Acute stroke due to occlusion of left middle cerebral artery [facility])  Hospital Problems: Principal Problem:   Acute stroke due to occlusion of left middle cerebral artery [facility]) Active Problems:   Left thalamic infarction [facility])    Expected Discharge Date: Expected Discharge Date: 08/15/21  Team Members Present: Physician leading conference: Dr. Sula Soda Social Worker Present: Lavera Guise, BSW Nurse Present: Chana Bode, RN PT Present: Raechel Chute, PT OT Present: Other (comment) [facility], OT) PPS Coordinator present : Edson Snowball, PT     Current Status/Progress Goal Weekly Team Focus  Bowel/Bladder     Continent/incontinent   Continent   Timed/prompted toileting  Swallow/Nutrition/ Hydration             ADL's   Min A grooming, min A UBD, Mod LBD, Max A toilet transfer stand pivot, Max A toileting, Max sit <> stand transfers  CGA functional transfers, supervision B/D  ADL retraining, AE education, functional use of RUE, sit > stands, BSC transfers   Mobility   supine<>sit supervision, stand/squat<>pivot transfers max A, sit<>stand with/without AD max A, WC mobility 76ft supervision - R knee buckles  supervision/min A  functional mobility/transfers, generalized strengthening, dynamic standing balance/coordination, gait training, NMR, and improved activity tolerance.   Communication             Safety/Cognition/ Behavioral Observations            Pain     N/A        Skin     Stage 1 on buttocks, hemorrhoids   Skin healing and no new skin issues   Foam  to buttocks, monitor skin and seek treatment for hemorrhoids if needed    Discharge Planning:    Home with daughter  Team Discussion: MD adjusting meds to home dose. Monitoring CKD; labs stable per MD. Loose stools noted without laxatives. Slow movement overall, knee buckling limits progress.  Patient on target to meet rehab goals: yes, currently min assist for upper body care and mod assist for lower body care. Requires max assist for toileting and stand pivot or sit to stand. Able to ambulate up to 20' with a RW and +2 mod assist. Goals for discharge set for supervision - min assist and CGA for transfers.  *See Care Plan and progress notes for long and short-term goals.   Revisions to Treatment Plan:  Neoprene sleeve for knee   Teaching Needs: Safety, medications, dietary modifications, secondary risk management, transfers, toileting, etc.  Current Barriers to Discharge: Decreased caregiver support and Home enviroment access/layout  Possible Resolutions to Barriers: Family education with daughter     Medical Summary Current Status: tachycardia, permissive hypertension, stage 3 CKD with creatinine currently better than baseline, loose stool, right knee buckling, obesity  Barriers to Discharge: Medical stability  Barriers to Discharge Comments: tachycardia, permissive hypertension, stage 3 CKD with creatinine currently better than baseline, loose stool, right knee buckling, obesity Possible Resolutions to [employer] Focus: add coreg, restart anti-hypertensives if needed, monitor creatinine and continue to encourage oral hydration,  neoprene knee sleeve ordered, provided dietary education   Continued Need for Acute Rehabilitation Level of Care: The patient requires daily medical management by a physician with specialized training in physical medicine and rehabilitation for the following reasons: Direction of a multidisciplinary physical rehabilitation program to maximize  functional independence : Yes Medical management of patient stability for increased activity during participation in an intensive rehabilitation regime.: Yes Analysis of laboratory values and/or radiology reports with any subsequent need for medication adjustment and/or medical intervention. : Yes   I attest that I was present, lead the team conference, and concur with the assessment and plan of the team.   Chana Bode B 07/26/2021, 4:04 PM

## 2021-07-26 NOTE — Progress Notes (Signed)
Occupational Therapy Session Note  Patient Details  Name: Diane Webb MRN: [id number] Date of Birth: 1927/06/18  Today's Date: 07/26/2021 OT Individual Time: 1350-1503 OT Individual Time Calculation (min): 73 min    Short Term Goals: Week 1:  OT Short Term Goal 1 (Week 1): Pt will perform sit <> stand with mod A in prep for ADLs OT Short Term Goal 2 (Week 1): Pt will complete LB dressing with min A OT Short Term Goal 3 (Week 1): Pt will complete toilet transfer with mod A  Skilled Therapeutic Interventions/Progress Updates:  Skilled OT intervention completed with focus on ADL retraining, stroke education and functional transfers. Pt received seated in w/c with daughter present. Pt's daughter with questions regarding her mother's POC, rehab goals, and DME. Discussed pt's bathroom/home environment with daughter while present in session, with report that pt does have shower seat in walk in shower with sliding doors, and bidet with handles to push up from. Educated about potential need for TTB for showers, with therapist encouraging daughter to take pictures of bathroom set up to better assess what DME can fit. Provided pt/family stroke education book and educated on the changes that can occur with stroke. Provided pt foam built up handle for eating utensils/toothbrush to assist with grooming and IADL tasks due to pt's decreased grip in both hands due to arthritis as well as in R hand with the weakness from stroke. Educated on AE including sock aid, and long handled shoe horn for donning footwear, with demonstration required and min A required with pt's return demonstration. Pt demonstrates decreased ability to sustain grasp in R hand, however can use hand functionally in tasks. Demonstrates tendency to neglect R hand with it leaning over side of w/c and at times during transfers. Pt educated about reminding self to use hand as much as possible , as dominant, in tasks like eating to promote increased  function of hand with education provided about stroke recovery with the motor process. Pt reported that previous therapist donned ace bandage on R knee to help with the buckling during transfers, with plan to bring smaller knee sleeve. Doffed ace bandage with total assist, then threaded knee sleeve with pt donning rest of the way with min A. Pt with request to return to bed at end of session. Completed sit > stand from w/c with Max A and daughter steadying w/c for safety > EOB, max A stand pivot with pt with lack of ability to pivot feet with therapist total A for stand pivot to bed. Doffed shoes with total A due to time. Bed mobility with min A. Pt left in position of comfort, with daughter in room, and bed alarm on with all needs in reach.  Therapy Documentation Precautions:  Precautions Precautions: Fall Precaution Comments: R hemiparesis; Pt's R knee has tendency to buckle during transfers Restrictions Weight Bearing Restrictions: No  Pain: Pt with no c/o pain during session.   Therapy/Group: Individual Therapy  Diane Webb Diane Webb 07/26/2021, 7:46 AM

## 2021-07-26 NOTE — Progress Notes (Signed)
Orthopedic Tech Progress Note Patient Details:  Diane Webb 12-28-1926 [id number]  Dropped off a KNEE SLEEVE for patient. I applied ACE WRAP 1st due to how big patient knee vs leg is.   Ortho Devices Type of Ortho Device: Ace wrap, Knee Sleeve Ortho Device/Splint Location: RLE Ortho Device/Splint Interventions: Ordered, Application, Other (comment)   Post Interventions Patient Tolerated: Well Instructions Provided: Care of device  Donald Pore 07/26/2021, 2:29 PM

## 2021-07-26 NOTE — Progress Notes (Signed)
Document left with patient. Chaplain will be call if needed further.  Venida Jarvis, [place], [facility], Pager [phone]

## 2021-07-27 DIAGNOSIS — I63512 Cerebral infarction due to unspecified occlusion or stenosis of left middle cerebral artery: Secondary | ICD-10-CM | POA: Diagnosis not present

## 2021-07-27 LAB — CBC WITH DIFFERENTIAL/PLATELET
Abs Immature Granulocytes: 0.05 10*3/uL (ref 0.00–0.07)
Basophils Absolute: 0 10*3/uL (ref 0.0–0.1)
Basophils Relative: 1 %
Eosinophils Absolute: 0.4 10*3/uL (ref 0.0–0.5)
Eosinophils Relative: 5 %
HCT: 34.3 % — ABNORMAL LOW (ref 36.0–46.0)
Hemoglobin: 11.5 g/dL — ABNORMAL LOW (ref 12.0–15.0)
Immature Granulocytes: 1 %
Lymphocytes Relative: 30 %
Lymphs Abs: 2.3 10*3/uL (ref 0.7–4.0)
MCH: 29.7 pg (ref 26.0–34.0)
MCHC: 33.5 g/dL (ref 30.0–36.0)
MCV: 88.6 fL (ref 80.0–100.0)
Monocytes Absolute: 0.7 10*3/uL (ref 0.1–1.0)
Monocytes Relative: 9 %
Neutro Abs: 4.3 10*3/uL (ref 1.7–7.7)
Neutrophils Relative %: 54 %
Platelets: 247 10*3/uL (ref 150–400)
RBC: 3.87 MIL/uL (ref 3.87–5.11)
RDW: 12.5 % (ref 11.5–15.5)
WBC: 7.7 10*3/uL (ref 4.0–10.5)
nRBC: 0 % (ref 0.0–0.2)

## 2021-07-27 NOTE — Progress Notes (Signed)
Physical Therapy Session Note  Patient Details  Name: Diane Webb MRN: [id number] Date of Birth: 1927-05-16  Today's Date: 07/27/2021 PT Individual Time: 0802-0856 PT Individual Time Calculation (min): 54 min   Short Term Goals: Week 1:  PT Short Term Goal 1 (Week 1): pt will transfer sit<>stand with LRAD and min A PT Short Term Goal 2 (Week 1): pt will transfer bed<>chair with LRAD and mod A consistantly PT Short Term Goal 3 (Week 1): pt will perform simulated car transfer with LRAD and mod A  Skilled Therapeutic Interventions/Progress Updates:   Received pt semi-reclined in bed, pt agreeable to PT treatment, and reported her stomach hurt and requested to try to use the bathroom. Session with emphasis on functional mobility/transfers, toileting, dressing, generalized strengthening, dynamic standing balance/coordination, and improved activity tolerance. Pt transferred semi-reclined<>sitting EOB with supervision with HOB elevated and use of bedrails and transferred sit<>stand in [place] (due to urgency) with mod A. Dependent transfer to toilet with bedside commode over top. Semi-sit<>stand in [place] with min A and total A to remove soiled brief, min A for eccentric descent onto commode. Pt able to void and with small BM - NT made aware. Sit<>stand from bedside commode in [place] with max A and required total A for posterior peri-care but able to perform anterior peri-care with CGA. Donned clean brief with total A and transferred to [facility] in [place] dependently. Pt requested to wash up and get dressed - washed face and underarms sitting in WC with supervision. Doffed dirty shirt and donned clean one with min A and cues to thread RUE in first. Donned pants sitting in WC with supervision and increased time with same cues and shoes with total A for time purposes. Pt transferred sit<>stand at sink with max A (cues for anterior weight shifting) and required total A to pull pants over hips. MD present for morning  rounds and pt sat in [facility] and brushed dentures with supervision with emphasis on functional use of RUE - therapist applied toothpaste to toothbrush dependently due to time restrictions. Applied face creams and lip gloss with setup assist and performed WC mobility 56ft x 1 and 62ft x 1 using BUE and LLE with supervision to fatigue - initially starting using BLE to propel WC, however pt's RLE frequently getting caught under WC and pt with little awareness, therefore donned legrest for safety. Concluded session with pt sitting in WC, needs within reach, and seatbelt alarm on.   Therapy Documentation Precautions:  Precautions Precautions: Fall Precaution Comments: R hemiparesis; Pt's R knee has tendency to buckle during transfers Restrictions Weight Bearing Restrictions: No  Therapy/Group: Individual Therapy Martin Majestic PT, DPT   07/27/2021, 7:14 AM

## 2021-07-27 NOTE — IPOC Note (Signed)
Overall Plan of Care [facility]) Patient Details Name: Diane Webb MRN: [id number] DOB: [birth year]  Admitting Diagnosis: Acute stroke due to occlusion of left middle cerebral artery [facility])  Hospital Problems: Principal Problem:   Acute stroke due to occlusion of left middle cerebral artery (HCC) Active Problems:   Left thalamic infarction [facility])     Functional Problem List: Nursing Bowel, Medication Management, Safety, Endurance  PT Balance, Edema, Endurance, Motor, Nutrition, Perception, Safety, Sensory, Skin Integrity  OT Motor, Perception, Safety, Sensory, Skin Integrity, Vision, Balance, Endurance  SLP    TR         Basic ADL's: OT Eating, Grooming, Bathing, Dressing, Toileting     Advanced  ADL's: OT Other (comment) (pt reports her only IADL she completed was making her bed)     Transfers: PT Bed Mobility, Bed to Chair, Car, Occupational psychologist, Other (comment) (walk in shower transfer)     Locomotion: PT Ambulation, Wheelchair Mobility, Stairs     Additional Impairments: OT Fuctional Use of Upper Extremity  SLP        TR      Anticipated Outcomes Item Anticipated Outcome  Self Feeding Set up  Swallowing      Basic self-care  Set up-min A  Toileting  CGA   Bathroom Transfers CGA  Bowel/Bladder  mange bowel w mod I  Transfers  supervision with LRAD  Locomotion  min A with LRAD  Communication     Cognition     Pain  n/a  Safety/Judgment  maintain safety w cues   Therapy Plan: PT Intensity: Minimum of 1-2 x/day ,45 to 90 minutes PT Frequency: 5 out of 7 days PT Duration Estimated Length of Stay: 3 weeks OT Intensity: Minimum of 1-2 x/day, 45 to 90 minutes OT Frequency: 5 out of 7 days OT Duration/Estimated Length of Stay: about 3 weeks     Due to the current state of emergency, patients may not be receiving their 3-hours of Medicare-mandated therapy.   Team Interventions: Nursing Interventions Disease Management/Prevention, Medication  Management, Discharge Planning, Bowel Management, Patient/Family Education  PT interventions Ambulation/gait training, Discharge planning, Functional mobility training, Psychosocial support, Therapeutic Activities, Visual/perceptual remediation/compensation, Balance/vestibular training, Disease management/prevention, Neuromuscular re-education, Skin care/wound management, Therapeutic Exercise, Wheelchair propulsion/positioning, Cognitive remediation/compensation, DME/adaptive equipment instruction, Pain management, Splinting/orthotics, UE/LE Strength taining/ROM, Community reintegration, Development worker, international aid stimulation, Patient/family education, Museum/gallery curator, UE/LE Coordination activities  OT Interventions Warden/ranger, Fish farm manager, Patient/family education, Therapeutic Activities, Wheelchair propulsion/positioning, Therapeutic Exercise, Community reintegration, Functional mobility training, Self Care/advanced ADL retraining, UE/LE Strength taining/ROM, Discharge planning, UE/LE Coordination activities, Pain management, Disease mangement/prevention, Visual/perceptual remediation/compensation  SLP Interventions    TR Interventions    SW/CM Interventions Discharge Planning, Psychosocial Support, Patient/Family Education, Disease Management/Prevention   Barriers to Discharge MD  Medical stability  Nursing Decreased caregiver support, Home environment access/layout 1 level level entry w children; rollator  PT Inaccessible home environment, Other (comments) R hemiparesis, weakness, deconditoning  OT Home environment access/layout, Lack of/limited family support    SLP      SW       Team Discharge Planning: Destination: PT-Home ,OT- Home , SLP-  Projected Follow-up: PT-Home health PT, OT-  Home health OT, SLP-  Projected Equipment Needs: PT-To be determined, OT- To be determined, SLP-  Equipment Details: PT-has rollator, OT-Pt reports having a BSC and  shower seat at home in walk in shower Patient/family involved in discharge planning: PT- Patient,  OT-Patient, SLP-   MD ELOS: 14-16 days Medical  Rehab Prognosis:  Excellent Assessment: Diane Webb is a 87 year old woman who is admitted to CIR with right side weakness secondary to left lateral thalamic infarct likely small vessel disease.Medications are being managed, and labs and vitals are being monitored regularly.      See Team Conference Notes for weekly updates to the plan of care

## 2021-07-27 NOTE — Progress Notes (Signed)
Occupational Therapy Session Note  Patient Details  Name: Alvia Jablonski MRN: [id number] Date of Birth: 08/24/1927  Today's Date: 07/27/2021 OT Individual Time: 1000-1055 OT Individual Time Calculation (min): 55 min    Short Term Goals: Week 1:  OT Short Term Goal 1 (Week 1): Pt will perform sit <> stand with mod A in prep for ADLs OT Short Term Goal 2 (Week 1): Pt will complete LB dressing with min A OT Short Term Goal 3 (Week 1): Pt will complete toilet transfer with mod A  Skilled Therapeutic Interventions/Progress Updates:  Pt greeted seated in w/c  agreeable to OT intervention. Session focus on transfer training, functional sit<>stands, RUE coordination, and simulated posterior pericare in standing. Pt declines need for ADLs, pt transported to gym with total A for time mgmt. Pt completed BITS from sitting to facilitate improvement with RUE coordination as pt presents with ataxia in RUE. Pt instructed to reach out of BOS to tap stimulus on screen with RUE only. Pt completed task for 1 min with pt receiving an accuracy score of 13.24% ( noted ataxia in RUE when reaching dynamically) with a reaction time of 6.54 secs, pt with most difficulty reaching to R lower quadrant. 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 Pt completed lateral scoot transfer from w/c>EOM with MOD A to L side. Pt completed x3 sit<>stands from EOM with OTA positioned on pts R side with pts RUE around OTAs neck and OTAs knee in front of pts R knee. Utilized mirror to provide visual feedback as pt with impaired proprioception with pt unable to feel R foot on floor. Pt able to stand with MOD A, better improvement today with anterior weight shift and elevating trunk. Trialed sit<>stand in stedy as well with pt to stand with MOD A, pt even able to stand for  simulated posterior pericare with pt reaching to periarea with LUE to retrieve clothes pins positioned on end of pts shirt. Pt completed additional lateral scoot transfer from EOM>w/c to R side with MOD A. Pt transported back to room with total A where pt left seated in w/c with all needs within reach and alarm belt activated.                              Therapy Documentation Precautions:  Precautions Precautions: Fall Precaution Comments: R hemiparesis; Pt's R knee has tendency to buckle during transfers Restrictions Weight Bearing Restrictions: No  Pain: pt reports no pain during session    Therapy/Group: Individual Therapy  Pollyann Glen [facility] 07/27/2021, 11:06 AM

## 2021-07-27 NOTE — Progress Notes (Signed)
Patient ID: Diane Webb, female   DOB: November 17, 1926, 87 y.o.   MRN: [id number] Team Conference Report to Patient/Family  Team Conference discussion was reviewed with the patient and caregiver, including goals, any changes in plan of care and target discharge date.  Patient and caregiver express understanding and are in agreement.  The patient has a target discharge date of 08/15/21.  SW spoke with pt daughter. Provided conference updates. Daughter had no questions or concerns. Pt has a rollator currently. No HH preference. SW will continue to follow up.   [name] 07/27/2021, 10:43 AM

## 2021-07-27 NOTE — Progress Notes (Addendum)
Occupational Therapy Session Note  Patient Details  Name: Diane Webb MRN: [id number] Date of Birth: March 19, 1927  Today's Date: 07/27/2021 OT Individual Time: 1331-1426 OT Individual Time Calculation (min): 55 min    Short Term Goals: Week 1:  OT Short Term Goal 1 (Week 1): Pt will perform sit <> stand with mod A in prep for ADLs OT Short Term Goal 2 (Week 1): Pt will complete LB dressing with min A OT Short Term Goal 3 (Week 1): Pt will complete toilet transfer with mod A  Skilled Therapeutic Interventions/Progress Updates:  Pt greeted seated in w/c agreeable to OT intervention with daughter present during session. Session focus on BADL reeducation, sit<>stands and RUE coordination/NMR. Pt reports need to void bladder. Pt completed sit<>stand to stedy with MOD A for total A transfer to [facility]. Pt required MOD A for 3/3 toileting tasks with pt able to complete anterior pericare with set- up assist, but required assist for clothing mgmt in standing. MOD A for sit<>stand to stedy with total A transfer back to w/c.  Pt transported to gym with total A.  Pt completed sit<>stand to stedy with MOD A, pt able to stand ~ 2 min while pt completed functional reaching/ [facility] task with RUE via connect 4 game. One episode of RLE buckling but no LOB d/t knee protection from stedy. Pt required seated rest break on seat of stedy after 2 min. Pt able to stand from seat of stedy with MIN A. Pt able to stand additional 2 mins to complete 2nd trial of connect 4 task with RUE. Pt continues to present with ataxic RUE when reaching but [facility] noted to improve.  Remainder of session to focus on NMR with RUE. Pt seated in w/c with RUE positioned on ball placed on wall pt instructed to roll ball horizontally R<>L for 1 min to provide NMR to RUE. Pt completed task for 1 min before needing rest break. Pt able to complete 2nd trial with pt able to roll ball up/down facilitating AAROM shoulder flexion/extension for 1 min before  reporting fatigue. Pt transported back to room with total A where pt completed lateral scoot transfer to EOB with MODA. Pt able to return to supine with MOD A.    pt left supine in bed with bed alarm activated and all needs within reach.                       Therapy Documentation Precautions:  Precautions Precautions: Fall Precaution Comments: R hemiparesis; Pt's R knee has tendency to buckle during transfers Restrictions Weight Bearing Restrictions: No  Pain: pt reports unrated pain in RUE during NMR, decreased ROM and provided rest breaks as needed.      Therapy/Group: Individual Therapy  Barron Schmid 07/27/2021, 3:54 PM

## 2021-07-27 NOTE — Progress Notes (Signed)
PROGRESS NOTE   Subjective/Complaints: No new complaints this morning Received the knee sleeve yesterday and barely feels it- discussed that she can wear if she finds helpful  ROS: + tingling in lower extremity, +loose stool, +knee buckling as per therapy   Objective:   No results found. Recent Labs    07/25/21 0516 07/27/21 0527  WBC 8.1 7.7  HGB 11.9* 11.5*  HCT 36.6 34.3*  PLT 261 247   Recent Labs    07/25/21 0516  NA 138  K 4.1  CL 104  CO2 26  GLUCOSE 100*  BUN 25*  CREATININE 1.33*  CALCIUM 9.0    Intake/Output Summary (Last 24 hours) at 07/27/2021 1036 Last data filed at 07/27/2021 0700 Gross per 24 hour  Intake 620 ml  Output 1 ml  Net 619 ml        Physical Exam: Vital Signs Blood pressure (!) (P) 144/46, pulse (P) 79, temperature (P) 98.3 F (36.8 C), temperature source (P) Oral, resp. rate 16, height 4\' 6"  (1.372 m), weight 66.1 kg, SpO2 (P) 100 %. Gen: no distress, normal appearing HEENT: oral mucosa pink and moist, NCAT Cardio: Reg rate Chest: normal effort, normal rate of breathing Abd: soft, non-distended Ext: no edema Psych: pleasant, normal affect Skin: intact Neurologic: Cranial nerves II through XII intact, motor strength is 5/5 in left and 4/5 RIght  deltoid, bicep, tricep, grip, hip flexor, knee extensors, ankle dorsiflexor and plantar flexor Sensory exam normal sensation to light touch and proprioception in left upper and lower extremities, absent on right side  Sensory ataxia  Right finger nose finger and RIght heel shin Musculoskeletal: Full range of motion in all 4 extremities. No joint swelling   Assessment/Plan: 1. Functional deficits which require 3+ hours per day of interdisciplinary therapy in a comprehensive inpatient rehab setting. Physiatrist is providing close team supervision and 24 hour management of active medical problems listed below. Physiatrist and rehab  team continue to assess barriers to discharge/monitor patient progress toward functional and medical goals  Care Tool:  Bathing    Body parts bathed by patient: Right arm, Left arm, Chest, Abdomen, Right upper leg, Left upper leg, Left lower leg, Face   Body parts bathed by helper: Right lower leg, Front perineal area, Buttocks     Bathing assist Assist Level: Moderate Assistance - Patient 50 - 74%     Upper Body Dressing/Undressing Upper body dressing   What is the patient wearing?: Pull over shirt    Upper body assist Assist Level: Minimal Assistance - Patient > 75%    Lower Body Dressing/Undressing Lower body dressing      What is the patient wearing?: Pants, Incontinence brief     Lower body assist Assist for lower body dressing: 2 Helpers     Toileting Toileting    Toileting assist Assist for toileting: Moderate Assistance - Patient 50 - 74%     Transfers Chair/bed transfer  Transfers assist     Chair/bed transfer assist level: Moderate Assistance - Patient 50 - 74% (lateral scoot transfer)     Locomotion Ambulation   Ambulation assist   Ambulation activity did not occur: Safety/medical concerns (fatigue, weakness,  decreased balance/postural control)  Assist level: 2 helpers Assistive device: Other (comment) (L handrail) Max distance: 59ft   Walk 10 feet activity   Assist  Walk 10 feet activity did not occur: Safety/medical concerns (fatigue, weakness, decreased balance/postural control)  Assist level: 2 helpers Assistive device: Other (comment) (L handrail)   Walk 50 feet activity   Assist Walk 50 feet with 2 turns activity did not occur: Safety/medical concerns (fatigue, weakness, decreased balance/postural control)         Walk 150 feet activity   Assist Walk 150 feet activity did not occur: Safety/medical concerns (fatigue, weakness, decreased balance/postural control)         Walk 10 feet on uneven surface   activity   Assist Walk 10 feet on uneven surfaces activity did not occur: Safety/medical concerns (fatigue, weakness, decreased balance/postural control)         Wheelchair     Assist Is the patient using a wheelchair?: Yes Type of Wheelchair: Manual    Wheelchair assist level: Supervision/Verbal cueing Max wheelchair distance: 22ft    Wheelchair 50 feet with 2 turns activity    Assist        Assist Level: Supervision/Verbal cueing   Wheelchair 150 feet activity     Assist      Assist Level: Total Assistance - Patient < 25% (pt assists some)   Blood pressure (!) (P) 144/46, pulse (P) 79, temperature (P) 98.3 F (36.8 C), temperature source (P) Oral, resp. rate 16, height 4\' 6"  (1.372 m), weight 66.1 kg, SpO2 (P) 100 %.    Medical Problem List and Plan: 1.  Right side weakness secondary to left lateral thalamic infarct likely small vessel disease             -patient may  shower             -ELOS/Goals: 14-16d  Continue CIR 2.  Impaired mobility: continue Lovenox             -antiplatelet therapy: Aspirin 81 mg daily and Plavix 75 mg day x3 weeks then aspirin alone 3. Pain Management: Tylenol as needed 4. Insomnia: continue Melatonin 10 mg nightly as needed sleep             -antipsychotic agents: N/A 5. Neuropsych: This patient is capable of making decisions on her own behalf. 6. Skin/Wound Care: Routine skin checks 7. Fluids/Electrolytes/Nutrition: Routine in and outs with follow-up chemistries 8.  Permissive hypertension.  Patient on losartan/HCTZ 100/25 daily prior to admission.  Resume as needed 9.  Hyperlipidemia.  Continue Lipitor 10.  CKD stage III.  Baseline creatinine 1.52.  Cr reviewed and current better than baseline, monitor as needed.  11. Tachycardia: continue coreg 3.125mg  daily, improved 12. Obesity BMI 35.99: change diet to heart healthy/carb modified.  13. Right knee buckling: knee sleeve ordered  LOS: 3 days A FACE TO FACE  EVALUATION WAS PERFORMED  P Qadir Folks 07/27/2021, 10:36 AM

## 2021-07-28 DIAGNOSIS — I63512 Cerebral infarction due to unspecified occlusion or stenosis of left middle cerebral artery: Secondary | ICD-10-CM | POA: Diagnosis not present

## 2021-07-28 MED ORDER — PANTOPRAZOLE SODIUM 40 MG PO TBEC: 40.0000 mg | DELAYED_RELEASE_TABLET | Freq: Every day | ORAL | Status: DC | PRN

## 2021-07-28 NOTE — Progress Notes (Signed)
Occupational Therapy Session Note  Patient Details  Name: Diane Webb MRN: [id number] Date of Birth: December 27, 1926  Today's Date: 07/28/2021 OT Individual Time: 1305-1400 OT Individual Time Calculation (min): 55 min    Short Term Goals: Week 1:  OT Short Term Goal 1 (Week 1): Pt will perform sit <> stand with mod A in prep for ADLs OT Short Term Goal 2 (Week 1): Pt will complete LB dressing with min A OT Short Term Goal 3 (Week 1): Pt will complete toilet transfer with mod A  Skilled Therapeutic Interventions/Progress Updates:  Skilled OT intervention completed with focus on gross grasping/fine motor tasks, RUE functional reaching and functional transfers. Pt received seated in w/c, agreeable to session. Pt reporting fatigue, from previous therapies. Opted to complete seated tasks, with total A provided with w/c to therapy gym. Participated in fine motor and gross grasping tasks with light/tan theraputty to assist in functional use of R hand during ADLs. Pt educated on the use of putty, purpose and to use throughout the day when waiting on other therapies. Plan to provide handout for visual aid to remember steps. Pt completed RUE reaching/pinching of clips at table top with pt able to pinch yellow and red clips in R hand however with increased time and concentration. Compensatory shoulder shrug present during reaching, which is in both shoulders due to arthritis/at pt's baseline. Lowered table top to decrease amount of shrug with pt able to grasp clips with more accuracy. Transported pt with total A in w/c due to time. Attempted sit > stand using RW, however unable to stand on 3 trials, despite max A. Pt reported fatigue, opted to squat pivot. Completed with Max A > EOB with cues for R side positioning throughout. Pt required min A for bed mobility. Pt left in a position of comfort, with bed alarm on and all needs on pt's strong side at end of session.  Therapy Documentation Precautions:   Precautions Precautions: Fall Precaution Comments: R hemiparesis; Pt's R knee has tendency to buckle during transfers Restrictions Weight Bearing Restrictions: No  Pain: No c/o pain during session.   Therapy/Group: Individual Therapy  Fauna Neuner E Adalynne Steffensmeier 07/28/2021, 7:45 AM

## 2021-07-28 NOTE — Progress Notes (Signed)
Occupational Therapy Session Note  Patient Details  Name: Diane Webb MRN: [id number] Date of Birth: [birth year]  Today's Date: 07/28/2021 OT Individual Time: 5465-6812 OT Individual Time Calculation (min): 69 min    Short Term Goals: Week 1:  OT Short Term Goal 1 (Week 1): Pt will perform sit <> stand with mod A in prep for ADLs OT Short Term Goal 2 (Week 1): Pt will complete LB dressing with min A OT Short Term Goal 3 (Week 1): Pt will complete toilet transfer with mod A  Skilled Therapeutic Interventions/Progress Updates:  Pt greeted seated in w/c  agreeable to OT intervention. Session focus on therapeutic activites focused on dynamic standing balance, RUE FMC/ NMR and functional sit<>stands. Pt transported to day room for rehab "fall festival." Pt completed sit<>stands with MOD A with LUE on Rw and RUE pushing up from w/c. Once standing pt completed dynamic reaching task where pt instructed to reach out of BOS with RUE to toss ping pong balls to target. Pt completed task x2 with MOD A each trial for sit<>stand and MIN A for standing balance.pt completed additional dynacmic standing balance task whre pt completed sit>stand with Rw with MOD A with pt placing RUE on table to provide NMR, utilizing LUE to toss rings to target, MINA for standing balance. Pt completed task x2. Pt additionally completed seated FMC task where pt painted wooden fall themed panel. Pt able to grasp small paint brush with tripod grasp to paint picture. Pt needed total A to open paints but able to paint picture with supervision, difficulties staying inside lines bue overall did much better than expected during task demonstrating improved dexterity and intrinsic muscle strength. Pt transported to gym with total A where pt completed seated RUE NMR where pt instructed to lean RUE into wall while LUE washed wall with wash cloth placed underneath L hand completing horizontal ABD/ADD. Pt able to complete task x3 reps for ~ 30 secs  at a time before reporting fatigue. Pt additionally completed seated FMC task where pt instructed to place 9 pegs into board with R hand only and then L hand only . Pt needed 52 secs to complete task with L hand and 3 min and 56 secs to complete task with R hand. Of note pts nails on R hand impede ability to grasp small pegs. Pt returned to room with total A where  pt left seated in w/c with alarm belt activated and all needs within reach.                      Therapy Documentation Precautions:  Precautions Precautions: Fall Precaution Comments: R hemiparesis; Pt's R knee has tendency to buckle during transfers Restrictions Weight Bearing Restrictions: No  Pain: pt reports unrated pain in BUEs, offered rest breaks as needed for pain mgmt.     Therapy/Group: Individual Therapy  Pollyann Glen [facility] 07/28/2021, 11:55 AM

## 2021-07-28 NOTE — Progress Notes (Signed)
Physical Therapy Session Note  Patient Details  Name: Diane Webb MRN: [id number] Date of Birth: 20-May-1927  Today's Date: 07/28/2021 PT Individual Time: 0900-0954 PT Individual Time Calculation (min): 54 min   Short Term Goals: Week 1:  PT Short Term Goal 1 (Week 1): pt will transfer sit<>stand with LRAD and min A PT Short Term Goal 2 (Week 1): pt will transfer bed<>chair with LRAD and mod A consistantly PT Short Term Goal 3 (Week 1): pt will perform simulated car transfer with LRAD and mod A  Skilled Therapeutic Interventions/Progress Updates:   Received pt semi-reclined in bed, pt agreeable to PT treatment, and denied any pain during session. Session with emphasis on functional mobility/transfers, dressing, generalized strengthening, dynamic standing balance/coordination, gait training, and improved activity tolerance. Pt requested to get dressed and transferred semi-reclined<>sitting EOB with HOB elevated and use of bedrails with supervision. Doffed nightgown and washed face, underarms, and applied deodorant with set up assist. Donned clean pull over shirt with supervision but had posterior LOB when putting shirt over head. Requested to brush teeth and sat EOB unsupported (to emphasize core control) and brushed dentures with supervision but required assist to put toothpaste on toothbrush. Pt with mild L neglect, dropping toothpaste on LLE and unaware, requiring cues for attention. Applied face creams, lipstick, and lotion with supervision with emphasis on functional use of LUE and donned L knee sleeve with total A and pants up to thighs sitting EOB with supervision. Donned shoes with total A and attempted to stand from EOB with RW to pull pants over hips with max A however upon standing, pt's R foot inverting/supinating requiring assist to place foot clean and pt began to tip anteriorly. Returned to sitting for safety and pt pulled pants over hips via lateral leans with supervision. Pt required  x 2 attempts and heavy max A to squat<>pivot into WC to L with max cues for sequencing/technique. Pt transported to/from room on 5C in [facility] dependently for time management purposes. Pt transferred sit<>stand at railing in hallway with max A with RUE around therapist and blocking R knee using mirror for visual feedback - pt's RLE inverting requiring assist for proper placement. Pt ambulated 6ft with L handrail with mod A +2 - pt required assist to advance and place R foot to prevent inversion but RLE did not buckle. Pt also required cues for upright posture and to increase RLE foot clearance/step length. Pt required rest break afterwards then attempted to work on dynamic standing balance hooking horseshoes to top R of mirror from L handrail, however pt limited in shoulder ROM due to arthritis, therefore stopped activity. Pt performed WC mobility 35ft using BUE and LLE towards room to fatigue. Concluded session with pt sitting in WC, needs within reach, and seatbelt alarm on.   Therapy Documentation Precautions:  Precautions Precautions: Fall Precaution Comments: R hemiparesis; Pt's R knee has tendency to buckle during transfers Restrictions Weight Bearing Restrictions: No  Therapy/Group: Individual Therapy Martin Majestic PT, DPT   07/28/2021, 7:29 AM

## 2021-07-28 NOTE — Progress Notes (Signed)
PROGRESS NOTE   Subjective/Complaints: No new complaints this morning Seen working with therapy multiple times per day- doing great with participation Vitals stable  ROS: + tingling in lower extremity, +loose stool, +knee buckling as per therapy   Objective:   No results found. Recent Labs    07/27/21 0527  WBC 7.7  HGB 11.5*  HCT 34.3*  PLT 247   No results for input(s): NA, K, CL, CO2, GLUCOSE, BUN, CREATININE, CALCIUM in the last 72 hours.   Intake/Output Summary (Last 24 hours) at 07/28/2021 1159 Last data filed at 07/28/2021 0758 Gross per 24 hour  Intake 348 ml  Output --  Net 348 ml        Physical Exam: Vital Signs Blood pressure 124/64, pulse 80, temperature 98.2 F (36.8 C), resp. rate 18, height 4\' 6"  (1.372 m), weight 66.1 kg, SpO2 95 %. Gen: no distress, normal appearing HEENT: oral mucosa pink and moist, NCAT Cardio: Reg rate Chest: normal effort, normal rate of breathing Abd: soft, non-distended Ext: no edema Psych: pleasant, normal affect Skin: intact Neurologic: Cranial nerves II through XII intact, motor strength is 5/5 in left and 4/5 RIght  deltoid, bicep, tricep, grip, hip flexor, knee extensors, ankle dorsiflexor and plantar flexor Sensory exam normal sensation to light touch and proprioception in left upper and lower extremities, absent on right side  Sensory ataxia  Right finger nose finger and RIght heel shin Musculoskeletal: Full range of motion in all 4 extremities. No joint swelling   Assessment/Plan: 1. Functional deficits which require 3+ hours per day of interdisciplinary therapy in a comprehensive inpatient rehab setting. Physiatrist is providing close team supervision and 24 hour management of active medical problems listed below. Physiatrist and rehab team continue to assess barriers to discharge/monitor patient progress toward functional and medical goals  Care  Tool:  Bathing    Body parts bathed by patient: Right arm, Left arm, Chest, Abdomen, Right upper leg, Left upper leg, Left lower leg, Face   Body parts bathed by helper: Right lower leg, Front perineal area, Buttocks     Bathing assist Assist Level: Moderate Assistance - Patient 50 - 74%     Upper Body Dressing/Undressing Upper body dressing   What is the patient wearing?: Pull over shirt    Upper body assist Assist Level: Minimal Assistance - Patient > 75%    Lower Body Dressing/Undressing Lower body dressing      What is the patient wearing?: Pants, Incontinence brief     Lower body assist Assist for lower body dressing: 2 Helpers     Toileting Toileting    Toileting assist Assist for toileting: Moderate Assistance - Patient 50 - 74%     Transfers Chair/bed transfer  Transfers assist     Chair/bed transfer assist level: Maximal Assistance - Patient 25 - 49%     Locomotion Ambulation   Ambulation assist   Ambulation activity did not occur: Safety/medical concerns (fatigue, weakness, decreased balance/postural control)  Assist level: 2 helpers Assistive device: Other (comment) (L handrail and RUE around therapist) Max distance: 63ft   Walk 10 feet activity   Assist  Walk 10 feet activity did not occur:  Safety/medical concerns (fatigue, weakness, decreased balance/postural control)  Assist level: 2 helpers Assistive device: Other (comment) (L handrail and RUE around therapist)   Walk 50 feet activity   Assist Walk 50 feet with 2 turns activity did not occur: Safety/medical concerns (fatigue, weakness, decreased balance/postural control)         Walk 150 feet activity   Assist Walk 150 feet activity did not occur: Safety/medical concerns (fatigue, weakness, decreased balance/postural control)         Walk 10 feet on uneven surface  activity   Assist Walk 10 feet on uneven surfaces activity did not occur: Safety/medical concerns  (fatigue, weakness, decreased balance/postural control)         Wheelchair     Assist Is the patient using a wheelchair?: Yes Type of Wheelchair: Manual    Wheelchair assist level: Supervision/Verbal cueing Max wheelchair distance: 45ft    Wheelchair 50 feet with 2 turns activity    Assist        Assist Level: Supervision/Verbal cueing   Wheelchair 150 feet activity     Assist      Assist Level: Total Assistance - Patient < 25% (pt assists some)   Blood pressure 124/64, pulse 80, temperature 98.2 F (36.8 C), resp. rate 18, height 4\' 6"  (1.372 m), weight 66.1 kg, SpO2 95 %.    Medical Problem List and Plan: 1.  Right side weakness secondary to left lateral thalamic infarct likely small vessel disease             -patient may  shower             -ELOS/Goals: 14-16d  Continue CIR 2.  Impaired mobility: continue Lovenox             -antiplatelet therapy: Aspirin 81 mg daily and Plavix 75 mg day x3 weeks then aspirin alone 3. Pain Management: Tylenol as needed 4. Insomnia: continue Melatonin 10 mg nightly as needed sleep             -antipsychotic agents: N/A 5. Neuropsych: This patient is capable of making decisions on her own behalf. 6. Skin/Wound Care: Routine skin checks 7. Fluids/Electrolytes/Nutrition: Routine in and outs with follow-up chemistries 8.  Permissive hypertension.  Patient on losartan/HCTZ 100/25 daily prior to admission.  No need to resume- excellent control 9.  Hyperlipidemia.  Continue Lipitor 10.  CKD stage III.  Baseline creatinine 1.52.  Cr reviewed and current better than baseline, monitor as needed.  11. Tachycardia: continue coreg 3.125mg  daily, improved 12. Obesity BMI 35.99: change diet to heart healthy/carb modified.  13. Right knee buckling: knee sleeve ordered 14. GERD without esophagitis: change protonix to PRN.   LOS: 4 days A FACE TO FACE EVALUATION WAS PERFORMED  Diane Webb 07/28/2021, 11:59 AM

## 2021-07-29 NOTE — Progress Notes (Signed)
Occupational Therapy Session Note  Patient Details  Name: Diane Webb MRN: [id number] Date of Birth: [birth year]  Today's Date: 07/29/2021 OT Individual Time: 0086-7619 ; and 1300-1410  OT Individual Time Calculation (min): 60 min ; and 70 min   Short Term Goals: Week 1:  OT Short Term Goal 1 (Week 1): Pt will perform sit <> stand with mod A in prep for ADLs OT Short Term Goal 2 (Week 1): Pt will complete LB dressing with min A OT Short Term Goal 3 (Week 1): Pt will complete toilet transfer with mod A   Skilled Therapeutic Interventions/Progress Updates:    First session: Pt semi reclined in bed, requesting to use the toilet.  Supine to sit with CGA and use of bed rails.  Pt completed sit to stand with max assist initially and posterior bias.  Returned to sitting and educated pt on body mechanics and provided visual demonstration on sit to stand and stand pivot transfer using RW.  Pt return demo'd with step by step multimodal cues, mod assist for initial boost then min assist for pivot and RW management, mod assist to slow descent when sitting.  Total assist to pull pants and brief down over hips.  Pt had continent episode of urine (documented in flowsheet).  Pt agreeable to completing bathing and dressing at bedside commode.  Pt doffed shirt with mod I.  Doffed pants and brief with overall mod assist.  Pt bathed UB with min assist for back and mod assist for LB.  Pt donned brief and pants with mod assist and increased time.  Donned shirt with min assist due to difficulty pulling over head. Stand pivot with mod assist and step by step Vcs.  Pt completed oral hygiene with setup sitting in w/c.Call bell in reach, seat alarm on at end of session.  Second Session: Pt sitting up in w/c with family present including son.  No c/o pain.  Pt completed blocked practice stand pivot transfer w/c<>EOB requiring min-mod assist and step by step multimodal cues using RW.  Pt then transported to day room and  participated in endurance training using nustep level 2 for 2 sets of 4 minutes each.  Pt appearing mildly short of breath however vitals assessed and WNL.  Pt completed stand pivot to w/c with min assist initially then mod assist to slow descent upon sitting.  Pt participated in dynamic sitting and standing tasks to promote forward weight shifting for carryover to functional transfers and also to encourage upright standing posture.  Pt completed sit<>stands with min-mod assist and increased time.  CGA to min assist for dynamic standing balance.  Pt transported back to room, agreeable to sitting up in w/c, call bell in reach, seat alarm on.  Therapy Documentation Precautions:  Precautions Precautions: Fall Precaution Comments: R hemiparesis; Pt's R knee has tendency to buckle during transfers Restrictions Weight Bearing Restrictions: No    Therapy/Group: Individual Therapy  Amie Critchley 07/29/2021, 2:55 PM

## 2021-07-29 NOTE — Progress Notes (Signed)
Physical Therapy Session Note  Patient Details  Name: Diane Webb MRN: [id number] Date of Birth: 08/19/1927  Today's Date: 07/29/2021 PT Individual Time: 2841-3244 PT Individual Time Calculation (min): 53 min   Short Term Goals: Week 1:  PT Short Term Goal 1 (Week 1): pt will transfer sit<>stand with LRAD and min A PT Short Term Goal 2 (Week 1): pt will transfer bed<>chair with LRAD and mod A consistantly PT Short Term Goal 3 (Week 1): pt will perform simulated car transfer with LRAD and mod A  Skilled Therapeutic Interventions/Progress Updates:    Pt received sitting in w/c and agreeable to therapy session.  Transported to/from gym in w/c for time management and energy conservation. Donned R LE ankle DF assist ACE wrap. Sit>stand w/c>L UE support on hallway rail with heavy mod assist to lift into standing and blocking R knee - pt demos delayed hip extension for trunk upright requiring facilitation for improvement. Stand>sits throughout session with mod assist for control when lowering due to poor eccentric control. Gait training ~22ft at L hallway rail with mod assist and +2 w/c follow - pt able to advance R LE during swing with min facilitation to prevent excessive adduction - guarding R knee during stance but no buckling noted - cuing/facilitation throughout for increased R hip extensor activation during R stance to maintain upright posture.   Sit>stand at litegait mod assist as described above. Donned litegait harness with pt able to maintain standing balance with CGA and therapist guarding R knee for safety.   Gait training in litegait harness (wearing ankle DF assist ACE wrap to protect R ankle) with B UE support and light partial BWS 26ft, 59ft, and 47ft with seated rest breaks between each - therapist only facilitating for R/L weight shifting at hips onto stance limb; otherwise pt able to advance R LE without assist with good placement but fluctuating R foot clearance during swing, pt  able to correct with only verbal cuing, achieved reciprocal stepping pattern 100% of the time and gradually increasing gait speed with each gait trial having increased speed of R LE swing phase advancement - only 1x R knee buckle with pt able to recover using B UE support and utilizing harness for safety during last gait trial when fatigued. No +2 assist needed to perform gait training in litegait. Pt with labored breathing after each gait trial - SpO2 monitored 98% or higher with HR ranging 86-94bpm.   Doffed harness in standing with CGA and guarding R knee for safety as described above. Transported back to room and pt requesting for assist back to bed. R squat pivot w/c>EOB mod assist for lifting/pivoting hips, cuing for head/hips relationship, therapist guarding R LE and promoting increased WBing. Sit>supine with CGA for safety. Pt left supine in bed, needs in reach, HOB maximally elevated, bed alarm on, family present, and meal tray set-up.    Therapy Documentation Precautions:  Precautions Precautions: Fall Precaution Comments: R hemiparesis; Pt's R knee has tendency to buckle during transfers Restrictions Weight Bearing Restrictions: No   Pain:  No reports of pain throughout session.   Therapy/Group: Individual Therapy  Ginny Forth , PT, DPT, NCS, CSRS  07/29/2021, 3:28 PM

## 2021-07-30 DIAGNOSIS — I63512 Cerebral infarction due to unspecified occlusion or stenosis of left middle cerebral artery: Secondary | ICD-10-CM | POA: Diagnosis not present

## 2021-07-30 MED ORDER — SORBITOL 70 % SOLN
30.0000 mL | Freq: Once | Status: AC
  Administered 2021-07-30: 30 mL via ORAL
  Filled 2021-07-30: qty 30

## 2021-07-30 NOTE — Progress Notes (Addendum)
PROGRESS NOTE   Subjective/Complaints:  Pt reports doing well- enjoying lunch- no complaints.   ROS:  Pt denies SOB, abd pain, CP, N/V/C/D, and vision changes  Objective:   No results found. No results for input(s): WBC, HGB, HCT, PLT in the last 72 hours.  No results for input(s): NA, K, CL, CO2, GLUCOSE, BUN, CREATININE, CALCIUM in the last 72 hours.   Intake/Output Summary (Last 24 hours) at 07/30/2021 1703 Last data filed at 07/30/2021 1300 Gross per 24 hour  Intake 592 ml  Output --  Net 592 ml        Physical Exam: Vital Signs Blood pressure 140/64, pulse 75, temperature 98.1 F (36.7 C), resp. rate 18, height 4\' 6"  (1.372 m), weight 66.1 kg, SpO2 93 %.   General: awake, alert, appropriate, sitting up in bed; eating lunch; NAD HENT: conjugate gaze; oropharynx moist CV: regular rate; no JVD Pulmonary: CTA B/L; no W/R/R- good air movement GI: soft, NT, ND, (+)BS Psychiatric: appropriate Neurological: alert  Skin: intact Neurologic: Cranial nerves II through XII intact, motor strength is 5/5 in left and 4/5 RIght  deltoid, bicep, tricep, grip, hip flexor, knee extensors, ankle dorsiflexor and plantar flexor Sensory exam normal sensation to light touch and proprioception in left upper and lower extremities, absent on right side  Sensory ataxia  Right finger nose finger and RIght heel shin Musculoskeletal: Full range of motion in all 4 extremities. No joint swelling   Assessment/Plan: 1. Functional deficits which require 3+ hours per day of interdisciplinary therapy in a comprehensive inpatient rehab setting. Physiatrist is providing close team supervision and 24 hour management of active medical problems listed below. Physiatrist and rehab team continue to assess barriers to discharge/monitor patient progress toward functional and medical goals  Care Tool:  Bathing    Body parts bathed by patient:  Right arm, Left arm, Chest, Abdomen, Right upper leg, Left upper leg, Left lower leg, Face   Body parts bathed by helper: Right lower leg, Front perineal area, Buttocks     Bathing assist Assist Level: Moderate Assistance - Patient 50 - 74%     Upper Body Dressing/Undressing Upper body dressing   What is the patient wearing?: Pull over shirt    Upper body assist Assist Level: Minimal Assistance - Patient > 75%    Lower Body Dressing/Undressing Lower body dressing      What is the patient wearing?: Pants, Incontinence brief     Lower body assist Assist for lower body dressing: 2 Helpers     Toileting Toileting    Toileting assist Assist for toileting: Moderate Assistance - Patient 50 - 74%     Transfers Chair/bed transfer  Transfers assist     Chair/bed transfer assist level: Moderate Assistance - Patient 50 - 74% (squat pivot)     Locomotion Ambulation   Ambulation assist   Ambulation activity did not occur: Safety/medical concerns (fatigue, weakness, decreased balance/postural control)  Assist level: Moderate Assistance - Patient 50 - 74% (able to perform with only 1person assist) Assistive device: Lite Gait Max distance: 75ft   Walk 10 feet activity   Assist  Walk 10 feet activity did not occur: Safety/medical  concerns (fatigue, weakness, decreased balance/postural control)  Assist level: 2 helpers Assistive device: Other (comment) (L handrail and RUE around therapist)   Walk 50 feet activity   Assist Walk 50 feet with 2 turns activity did not occur: Safety/medical concerns (fatigue, weakness, decreased balance/postural control)         Walk 150 feet activity   Assist Walk 150 feet activity did not occur: Safety/medical concerns (fatigue, weakness, decreased balance/postural control)         Walk 10 feet on uneven surface  activity   Assist Walk 10 feet on uneven surfaces activity did not occur: Safety/medical concerns (fatigue,  weakness, decreased balance/postural control)         Wheelchair     Assist Is the patient using a wheelchair?: Yes Type of Wheelchair: Manual    Wheelchair assist level: Supervision/Verbal cueing Max wheelchair distance: 7ft    Wheelchair 50 feet with 2 turns activity    Assist        Assist Level: Supervision/Verbal cueing   Wheelchair 150 feet activity     Assist      Assist Level: Total Assistance - Patient < 25% (pt assists some)   Blood pressure 140/64, pulse 75, temperature 98.1 F (36.7 C), resp. rate 18, height 4\' 6"  (1.372 m), weight 66.1 kg, SpO2 93 %.    Medical Problem List and Plan: 1.  Right side weakness secondary to left lateral thalamic infarct likely small vessel disease             -patient may  shower             -ELOS/Goals: 14-16d  Con't PT and OT-  2.  Impaired mobility: continue Lovenox             -antiplatelet therapy: Aspirin 81 mg daily and Plavix 75 mg day x3 weeks then aspirin alone 3. Pain Management: Tylenol as needed  10/30 denies pain- con't regimen prn 4. Insomnia: continue Melatonin 10 mg nightly as needed sleep             -antipsychotic agents: N/A 5. Neuropsych: This patient is capable of making decisions on her own behalf. 6. Skin/Wound Care: Routine skin checks 7. Fluids/Electrolytes/Nutrition: Routine in and outs with follow-up chemistries 8.  Permissive hypertension.  Patient on losartan/HCTZ 100/25 daily prior to admission.  No need to resume- excellent control  10/30- BOPwell controlled overall- is 140/75 this AM, but 130s systolic yesterday- con't regimen 9.  Hyperlipidemia.  Continue Lipitor 10.  CKD stage III.  Baseline creatinine 1.52.  Cr reviewed and current better than baseline, monitor as needed.  11. Tachycardia: continue coreg 3.125mg  daily, improved  10/30- Pulse well controlled- con't regimen 12. Obesity BMI 35.99: change diet to heart healthy/carb modified.  13. Right knee buckling: knee  sleeve ordered 14. GERD without esophagitis: change protonix to PRN. 15. Constipation- 10/30- will give sorbitol 30 cc x1.    LOS: 6 days A FACE TO FACE EVALUATION WAS PERFORMED  Diane Webb 07/30/2021, 5:03 PM

## 2021-07-31 DIAGNOSIS — I63512 Cerebral infarction due to unspecified occlusion or stenosis of left middle cerebral artery: Secondary | ICD-10-CM | POA: Diagnosis not present

## 2021-07-31 NOTE — Progress Notes (Signed)
Occupational Therapy Session Note  Patient Details  Name: Diane Webb MRN: [id number] Date of Birth: 09-25-1927  Today's Date: 07/31/2021 OT Individual Time: 1100-1154 OT Individual Time Calculation (min): 54 min    Short Term Goals: Week 1:  OT Short Term Goal 1 (Week 1): Pt will perform sit <> stand with mod A in prep for ADLs OT Short Term Goal 2 (Week 1): Pt will complete LB dressing with min A OT Short Term Goal 3 (Week 1): Pt will complete toilet transfer with mod A  Skilled Therapeutic Interventions/Progress Updates:  Pt greeted  seated in w/c agreeable to OT intervention. Session focus on therapeutic activities focused on sit<>stands, RUE AROM, and functional transfers. Pt transported to gym with total A where pt completed lateral scoot transfer to EOM with MIN A going to R side. Pt completed sit<>stand from EOM with RUE on mat and LUE on RW with MOD A. Pt engaged in dynamic standing balance task where pt instructed to place RUE on wedge on top of glider ( to reduce friction)  and instructed to push glider up to top of wedge to facilitate AROM and provide NMR. Pt completed task with MIN A for standing balance with LUE positioned on RW. Pt completed additional standing balance task where pt able to sit<>stand with MOD A from EOM (hand positioned in same manner) with pt playing checkers with RUE to facilitate improved RUE FMC. Pt able to stand for 3 mins to complete game, MIN A for standing balance. Worked on functional stepping in standing as precursor to higher level functional mobility with pt able to step RLE and LLE forward and backwards with visual cue of colored numbers on the floor, graded task up and had pt practice stand pivot transfer from EOM<>w/c with pt able to stand from EOM with MIN A ( this time with RUE on RW and LUE pushing from EOM) once pt in standing pt able to pivot with MIN A with verbal cues for sequencing and foot/ Rw placement. Pt completed x3 stand pivots with rw  and overall MIN A once in standing.  Pt transported back to room with total A where pt left  seated in w/c with alarm belt activated and all needs within reach.                      Therapy Documentation Precautions:  Precautions Precautions: Fall Precaution Comments: R hemiparesis; Pt's R knee has tendency to buckle during transfers Restrictions Weight Bearing Restrictions: No  Pain: pt reports no pain during session     Therapy/Group: Individual Therapy  Barron Schmid 07/31/2021, 12:16 PM

## 2021-07-31 NOTE — Progress Notes (Signed)
Physical Therapy Session Note  Patient Details  Name: Diane Webb MRN: [id number] Date of Birth: 09/01/1927  Today's Date: 07/31/2021 PT Individual Time: 4098-1191 PT Individual Time Calculation (min): 72 min   Short Term Goals: Week 1:  PT Short Term Goal 1 (Week 1): pt will transfer sit<>stand with LRAD and min A PT Short Term Goal 2 (Week 1): pt will transfer bed<>chair with LRAD and mod A consistantly PT Short Term Goal 3 (Week 1): pt will perform simulated car transfer with LRAD and mod A  Skilled Therapeutic Interventions/Progress Updates:   Received pt sitting in [facility] with son present at bedside. Pt agreeable to PT treatment and denied any pain during session. Session with emphasis on functional mobility/transfers, generalized strengthening, dynamic standing balance/coordination, gait training, toileting, and improved activity tolerance. Pt transported to/from room in [facility] total A for time management and energy conservation purposes. Donned R DF ace wrap with total A and pt transferred sit<>stand with RW and heavy mod A and donned LiteGait harness in standing with max A while guarding R knee from buckling. Pt stepped on/off LiteGait Treadmill with max A and cues for "up with the good, down with the bad" technique and worked on gait training on LiteGait treadmill for the following time frames: -Trial 1: 3 minutes for 50ft at 0. increasing to 0. - manual facilitation to swing RLE through and position and verbal cues for upright posture/gaze, -Trial 2: 2 minutes and 18 seconds for 44ft at 0.3 mph - visual cues to step RLE towards therapist's foot to improve foot clearance. Adjusted anterior hand grip for improved posture -Trial 3: 1 minute 57 seconds for 47ft ranging from 0.3-0. with same cues from trial 2.  Pt required multiple extended rest breaks in between ambulation trials due to fatigue/SOB. Pt performed WC mobility 115ft using BUE/LE and supervision and transported back to  room remainder of way dependently due to fatigue. Pt reported urge to void and transferred sit<>stand in [place] with heavy mod A. Dependent transfer to/from toilet with bedside commode over top for time management purposes. Pt required total A for clothing management and able to void. Sit<>stand from bedside commode with [place] and max A and required total A for peri-care. Pt requested to return to bed and transported back to bed dependently in [place]. Doffed shoes with supervision and transferred sit<>supine with supervision and able to scoot to [facility] with supervision and cues for technique. Concluded session with pt semi-reclined in bed, needs within reach, and bed alarm on.   Therapy Documentation Precautions:  Precautions Precautions: Fall Precaution Comments: R hemiparesis; Pt's R knee has tendency to buckle during transfers Restrictions Weight Bearing Restrictions: No  Therapy/Group: Individual Therapy Martin Majestic PT, DPT   07/31/2021, 7:41 AM

## 2021-07-31 NOTE — Progress Notes (Signed)
Occupational Therapy Session Note  Patient Details  Name: Diane Webb MRN: [id number] Date of Birth: 1927/05/11  Today's Date: 07/31/2021 OT Individual Time: 3299-2426 OT Individual Time Calculation (min): 54 min    Short Term Goals: Week 1:  OT Short Term Goal 1 (Week 1): Pt will perform sit <> stand with mod A in prep for ADLs OT Short Term Goal 2 (Week 1): Pt will complete LB dressing with min A OT Short Term Goal 3 (Week 1): Pt will complete toilet transfer with mod A  Skilled Therapeutic Interventions/Progress Updates:  Skilled OT intervention completed with focus on ADL retraining. Pt received supine in bed, agreeable to session. Completed bed mobility with supervision. While EOB, pt completed self-care tasks, with supervision assist needed for UB bathing, min A needed for UB dressing to get shirt over head, with pt at baseline typically reclining back on the bed to get shirt over her own head due to limited AROM in both shoulders. Able to bathe LB with min A for feet, however pt with improved ability to lean forward for cleaning of BLEs with L hand use and encouragement provided to use R hand during functional tasks for recovery, as much as possible. Able to assist donning R knee sleeve once threaded over foot. Pt able to thread both legs into pants this session with supervision, without AE, then donn shoes using LH shoe horn with pt able to donn shoes after therapist assisting to position shoe horn in shoe for pt. Completed sit > stand from elevated bed using RW and mod A to stand for therapist to donn pants around waist. Pt reporting feeling of R knee being very weak, and presented with forward posture in standing and decreased strength and balance, despite verbal cues. Attempted stand pivot transfer to w/c, however pt with poor sequencing and motor planning this session and unable to move RLE for safe transfer. Return seated, then squat pivot to w/c with mod A and cues required for  positioning of BLEs to keep both feet in supported position for lifting bottom during transfer, as pt often has decreased awareness of where R foot is during transitions. Completed grooming tasks at sink, with set up A, and pt able to open/close toothpaste cap with good fine motor control in R hand. Pt reports that over the weekend, she noticed an improvement in her R hand while reaching and gripping things, with an improvement in feeling like she has some "control" back in it. Pt encouraged to continue use with R hand for continued progress. Attempted on 2 trials, to stand at sink from w/c using RW, however unable to stand despite max A and verbal and tactile cues for positioning and technique, with pt reporting fatigue and noticeable wheeze in breathing. SPO2 checked, with 97% sat, and discussed potential use of inhaler that was mentioned to her by nursing over weekend. Discussed following up on receiving/using it, as pt reports feeling more wheezy and SOB with basic tasks than normal, despite vitals being WNL during assessment. Pt educated on natural weaknesses that occur with stroke, however agreed with f/u on inhaler with nurse. Pt left seated in w/c, with chair alarm on, and all needs left on pt's strong/intact side for immediate needs at end of session.  Therapy Documentation Precautions:  Precautions Precautions: Fall Precaution Comments: R hemiparesis; Pt's R knee has tendency to buckle during transfers Restrictions Weight Bearing Restrictions: No  Pain: No c/o pain during session.   Therapy/Group: Individual Therapy  Diane Webb E  Diane Webb 07/31/2021, 7:32 AM

## 2021-07-31 NOTE — Progress Notes (Signed)
PROGRESS NOTE   Subjective/Complaints: No complaints this morning Had a good weekend  ROS:  Pt denies SOB, abd pain, CP, N/V/C/D, and vision changes  Objective:   No results found. No results for input(s): WBC, HGB, HCT, PLT in the last 72 hours.  No results for input(s): NA, K, CL, CO2, GLUCOSE, BUN, CREATININE, CALCIUM in the last 72 hours.   Intake/Output Summary (Last 24 hours) at 07/31/2021 0958 Last data filed at 07/31/2021 0827 Gross per 24 hour  Intake 598 ml  Output --  Net 598 ml        Physical Exam: Vital Signs Blood pressure (!) 114/50, pulse 78, temperature 98.4 F (36.9 C), temperature source Oral, resp. rate 18, height 4\' 6"  (1.372 m), weight 66.1 kg, SpO2 97 %. Gen: no distress, normal appearing HEENT: oral mucosa pink and moist, NCAT Cardio: Reg rate Chest: normal effort, normal rate of breathing Abd: soft, non-distended Ext: no edema Psych: pleasant, normal affect  Skin: intact Neurologic: Cranial nerves II through XII intact, motor strength is 5/5 in left and 4/5 RIght  deltoid, bicep, tricep, grip, hip flexor, knee extensors, ankle dorsiflexor and plantar flexor Sensory exam normal sensation to light touch and proprioception in left upper and lower extremities, absent on right side  Sensory ataxia  Right finger nose finger and RIght heel shin Musculoskeletal: Full range of motion in all 4 extremities. No joint swelling   Assessment/Plan: 1. Functional deficits which require 3+ hours per day of interdisciplinary therapy in a comprehensive inpatient rehab setting. Physiatrist is providing close team supervision and 24 hour management of active medical problems listed below. Physiatrist and rehab team continue to assess barriers to discharge/monitor patient progress toward functional and medical goals  Care Tool:  Bathing    Body parts bathed by patient: Right arm, Left arm, Chest,  Abdomen, Right upper leg, Left upper leg, Left lower leg, Face   Body parts bathed by helper: Right lower leg, Front perineal area, Buttocks     Bathing assist Assist Level: Moderate Assistance - Patient 50 - 74%     Upper Body Dressing/Undressing Upper body dressing   What is the patient wearing?: Pull over shirt    Upper body assist Assist Level: Minimal Assistance - Patient > 75%    Lower Body Dressing/Undressing Lower body dressing      What is the patient wearing?: Pants, Incontinence brief     Lower body assist Assist for lower body dressing: 2 Helpers     Toileting Toileting    Toileting assist Assist for toileting: Moderate Assistance - Patient 50 - 74%     Transfers Chair/bed transfer  Transfers assist     Chair/bed transfer assist level: Moderate Assistance - Patient 50 - 74% (squat pivot)     Locomotion Ambulation   Ambulation assist   Ambulation activity did not occur: Safety/medical concerns (fatigue, weakness, decreased balance/postural control)  Assist level: Moderate Assistance - Patient 50 - 74% (able to perform with only 1person assist) Assistive device: Lite Gait Max distance: 76ft   Walk 10 feet activity   Assist  Walk 10 feet activity did not occur: Safety/medical concerns (fatigue, weakness, decreased balance/postural control)  Assist level: 2 helpers Assistive device: Other (comment) (L handrail and RUE around therapist)   Walk 50 feet activity   Assist Walk 50 feet with 2 turns activity did not occur: Safety/medical concerns (fatigue, weakness, decreased balance/postural control)         Walk 150 feet activity   Assist Walk 150 feet activity did not occur: Safety/medical concerns (fatigue, weakness, decreased balance/postural control)         Walk 10 feet on uneven surface  activity   Assist Walk 10 feet on uneven surfaces activity did not occur: Safety/medical concerns (fatigue, weakness, decreased  balance/postural control)         Wheelchair     Assist Is the patient using a wheelchair?: Yes Type of Wheelchair: Manual    Wheelchair assist level: Supervision/Verbal cueing Max wheelchair distance: 52ft    Wheelchair 50 feet with 2 turns activity    Assist        Assist Level: Supervision/Verbal cueing   Wheelchair 150 feet activity     Assist      Assist Level: Total Assistance - Patient < 25% (pt assists some)   Blood pressure (!) 114/50, pulse 78, temperature 98.4 F (36.9 C), temperature source Oral, resp. rate 18, height 4\' 6"  (1.372 m), weight 66.1 kg, SpO2 97 %.    Medical Problem List and Plan: 1.  Right side weakness secondary to left lateral thalamic infarct likely small vessel disease             -patient may  shower             -ELOS/Goals: 14-16d  Continue PT and OT-  2.  Impaired mobility: continue Lovenox             -antiplatelet therapy: Aspirin 81 mg daily and Plavix 75 mg day x3 weeks then aspirin alone 3. Pain Management: Tylenol as needed  10/30 denies pain- con't regimen prn 4. Insomnia: continue Melatonin 10 mg nightly as needed sleep             -antipsychotic agents: N/A 5. Neuropsych: This patient is capable of making decisions on her own behalf. 6. Skin/Wound Care: Routine skin checks 7. Fluids/Electrolytes/Nutrition: Routine in and outs with follow-up chemistries 8.  Permissive hypertension.  Patient on losartan/HCTZ 100/25 daily prior to admission.  No need to resume- excellent control  10/30- BOPwell controlled overall- is 140/75 this AM, but 130s systolic yesterday- con't regimen 9.  Hyperlipidemia.  LDL 198. Continue Lipitor 10.  CKD stage III.  Baseline creatinine 1.52.  Cr reviewed and current better than baseline, monitor as needed.  11. Hypotension: d/c Coreg 12. Obesity BMI 35.99: change diet to heart healthy/carb modified.  13. Right knee buckling: knee sleeve ordered 14. GERD without esophagitis: change  protonix to PRN. 15. Constipation- 10/30- will give sorbitol 30 cc x1.    LOS: 7 days A FACE TO FACE EVALUATION WAS PERFORMED  11/30 Lovis More 07/31/2021, 9:58 AM

## 2021-08-01 DIAGNOSIS — I63512 Cerebral infarction due to unspecified occlusion or stenosis of left middle cerebral artery: Secondary | ICD-10-CM | POA: Diagnosis not present

## 2021-08-01 MED ORDER — LOSARTAN POTASSIUM 25 MG PO TABS
12.5000 mg | ORAL_TABLET | Freq: Every day | ORAL | Status: DC
  Administered 2021-08-01 – 2021-08-07 (×7): 12.5 mg via ORAL
  Filled 2021-08-01 (×7): qty 1

## 2021-08-01 NOTE — Progress Notes (Signed)
Occupational Therapy Session Note  Patient Details  Name: Diane Webb MRN: [id number] Date of Birth: 30-May-1927  Today's Date: 08/01/2021 OT Individual Time: 1300-1355 OT Individual Time Calculation (min): 55 min    Short Term Goals: Week 1:  OT Short Term Goal 1 (Week 1): Pt will perform sit <> stand with mod A in prep for ADLs OT Short Term Goal 1 - Progress (Week 1): Met OT Short Term Goal 2 (Week 1): Pt will complete LB dressing with min A OT Short Term Goal 2 - Progress (Week 1): Met OT Short Term Goal 3 (Week 1): Pt will complete toilet transfer with mod A OT Short Term Goal 3 - Progress (Week 1): Progressing toward goal  Skilled Therapeutic Interventions/Progress Updates:    Pt resting in w/c upon arrival and agreeable to participating in therapy. OT intervention with focus on RUE GM/FMC table tasks to improve pt's ability to complete functional tasks/BADLs. Table tasks included colored peg board, clothes pins on dowel, and 9 hole peg test. Pt able to grasp large colored pegs with relative ease but exhibited uncoordinated UE control with open ended task of placing in holes on board. Pt exhibited similar movements with clothes pins activity. 9 hole peg test: R-1:16 L-:40. Pt exhinited difficulty grasping smaller pegs. Pt required rest breaks during peg board and clothes pins activities 2/2 fatigue. Pt returned to room and remained in w/c with belt alarm activated. All needs within reach.   Therapy Documentation Precautions:  Precautions Precautions: Fall Precaution Comments: R hemiparesis; Pt's R knee has tendency to buckle during transfers Restrictions Weight Bearing Restrictions: No  Pain:  Pt denies pain this afternoon   Therapy/Group: Individual Therapy  Leroy Libman 08/01/2021, 2:45 PM

## 2021-08-01 NOTE — Progress Notes (Signed)
Occupational Therapy Session Note  Patient Details  Name: Diane Webb MRN: [id number] Date of Birth: 1927-03-14  Today's Date: 08/01/2021 OT Individual Time: 2505-3976 OT Individual Time Calculation (min): 54 min    Short Term Goals: Week 1:  OT Short Term Goal 1 (Week 1): Pt will perform sit <> stand with mod A in prep for ADLs OT Short Term Goal 2 (Week 1): Pt will complete LB dressing with min A OT Short Term Goal 3 (Week 1): Pt will complete toilet transfer with mod A  Skilled Therapeutic Interventions/Progress Updates:  Skilled OT intervention completed with focus on grooming tasks and functional sit > stands and reaching tasks. Pt received seated in w/c, agreeable to session. Reported feeling fatigued after PT session. Requested to complete grooming tasks while seated in w/c at sink with set up A. Transported pt in w/c with total A to therapy gym. Completed mass practice of sit <> stands from w/c, with ACE wrap applied to R knee with total A on knee prior to standing. Provided education on the 5 step process to stand (lock brakes, scoot forward, position R foot, lean forward, position R foot in standing), as pt demonstrates difficulty with sequencing how to prep to stand with verbal cues required throughout. Provided block practice with the 5 steps with pt able to recall 4/5 steps in the correct order with 2 trials. Discussed potential benefit of using visual aid on RW to assist pt in reminders to prep for stand, to promote safety and further independence with transfers. Completed 3 sit <> stands from w/c with R arm around therapist and therapist blocking R knee to prevent knee buckling. Pt able to sustain stand with min A for about 1 minute each, however required verbal cues to position R foot underneath her to promote strong BOS for standing as pt has decreased proprioception during transfers in R leg. Completed 4th stand with same assist, however had pt reach with intact L side towards  cones on table this trial, to stack/unstack cones to promote weight bearing and weight shifting with about a 1.5 minutes of sustained standing with min A. Pt reported feeling winded after each sit > stand trial, and seated rest breaks and pursed lip breathing techniques. Completed functional reaching task, seated in w/c, using RUE to pinch red and green clips <> clip horizontal ladder. Pt demonstrated improvement with R hand control, graded task up by having pt grab clip and reach to R side to give therapist clip. Transported pt in w/c with total A for time to room. Left pt seated in w/c, with alarm belt on and all needs in reach on pt's intact side.  Therapy Documentation Precautions:  Precautions Precautions: Fall Precaution Comments: R hemiparesis; Pt's R knee has tendency to buckle during transfers Restrictions Weight Bearing Restrictions: No  Pain: No c/o pain   Therapy/Group: Individual Therapy  Sigrid Schwebach E Landrie Beale 08/01/2021, 7:34 AM

## 2021-08-01 NOTE — Progress Notes (Signed)
Physical Therapy Session Note  Patient Details  Name: Diane Webb MRN: [id number] Date of Birth: Dec 21, 1926  Today's Date: 08/01/2021 PT Individual Time: 0910-1005 PT Individual Time Calculation (min): 55 min   Short Term Goals: Week 2:  PT Short Term Goal 1 (Week 2): pt will transfer sit<>stand with LRAD and min A PT Short Term Goal 2 (Week 2): pt will transfer bed<>chair with LRAD and mod A consistantly PT Short Term Goal 3 (Week 2): pt will ambulate 68ft with LRAD and mod A of 1  Skilled Therapeutic Interventions/Progress Updates:  Pt received supine in bed and agreeable to therapy session. Supine>sitting R EOB, HOB flat, with supervision. Sitting EOB doffed night gown and donned clean shirt supervision for trunk control safety - threaded LB clothing and donned tennis shoes total assist for time management. Sit>stand EOB>L UE support on armrest with mod assist for lifting to stand and blocking R knee buckle then pulling pants up over hips total assist - stays in forward trunk flexed posture during this lacking strength for hip extension to bring herself upright (also could be combined with fear of falling). L squat pivot EOB>w/c with mod assist for lifting/pivoting hips while maintaining balance and facilitating R LE WBing.  Transported to/from gym in w/c for time management and energy conservation.   Donned R LE ankle DF assist ACE wrap . Sitting in w/c started to don litegait harness. Sit>stand w/c>B UE support on litegait with lighter mod assist for lifting to stand and blocking R knee for safety but not buckling - standing with CGA and therapist finished donning harness.  Gait training in litegait harness overground using B UE support throughout:  - 132ft with slight BWS  - 146ft then 132ft without BWS but in harness for safety Pt able to advance R LE without assist demoing positive step-length for reciprocal pattern. Able to stabilize R LE during stance with no knee instability noted.  Does have slight excessive hip flexion causing anterior trunk lean with intermittent verbal/tactile cuing throughout for improved upright posture also noticed pt uses B UE support on litegait handles to assist with weight shifting R/L throughout gait.  SpO2 >90% throughout and HR elevating to 101bpm with activity - Provided seated rest breaks between each gait trial  Pt would benefit from trials with decreased B UE support to target increased B LE WBing and facilitation of weight shifting from the pelvis opposed to compensating by pulling with her hands.  Doffed litegait harness as described above. Transported back to her room and left seated in w/c with needs in reach and seat belt alarm on.  Therapy Documentation Precautions:  Precautions Precautions: Fall Precaution Comments: R hemiparesis; Pt's R knee has tendency to buckle during transfers Restrictions Weight Bearing Restrictions: No   Pain:  Denies pain during session.   Therapy/Group: Individual Therapy  Ginny Forth , PT, DPT, NCS, CSRS 08/01/2021, 7:52 AM

## 2021-08-01 NOTE — Progress Notes (Signed)
Occupational Therapy Weekly Progress Note  Patient Details  Name: Diane Webb MRN: [id number] Date of Birth: [birth year]  Beginning of progress report period: July 25, 2021 End of progress report period: August 01, 2021   Patient has met 2 of 3 short term goals.  Pt making steady progress toward LTGs, with improvements in sit > stand assist level from using stedy and max A to min A from w/c with R knee blocking and pt's R arm wrapped around therapist during transfer. Pt demonstrates decreased proprioception in R side, which continues to challenge pt during functional tasks however pt has improved her coordination with RUE functional reaching during tasks, and reports improvement in the tingling sensation and weakness she once felt. Pt is able complete bathing and dressing now with supervision-min A vs mod-max A, with pt able to reach BLEs without AE. Pt still requires mod assist for socks and footwear. Pt continues to progress with squat pivot transfers with mod A, however still requires cueing for positioning during transition.  Patient continues to demonstrate the following deficits: muscle weakness and muscle joint tightness, decreased cardiorespiratoy endurance, impaired timing and sequencing, decreased coordination, and decreased motor planning, and decreased attention to right and therefore will continue to benefit from skilled OT intervention to enhance overall performance with BADL, iADL, and Reduce care partner burden.  Patient progressing toward long term goals..  Continue plan of care.  OT Short Term Goals Week 1:  OT Short Term Goal 1 (Week 1): Pt will perform sit <> stand with mod A in prep for ADLs OT Short Term Goal 1 - Progress (Week 1): Met OT Short Term Goal 2 (Week 1): Pt will complete LB dressing with min A OT Short Term Goal 2 - Progress (Week 1): Met OT Short Term Goal 3 (Week 1): Pt will complete toilet transfer with mod A OT Short Term Goal 3 - Progress (Week 1):  Progressing toward goal Week 2:  OT Short Term Goal 1 (Week 2): Pt will complete toilet transfer with mod A OT Short Term Goal 2 (Week 2): Pt will complete functional squat pivot transfer for prep for ADLs with min A OT Short Term Goal 3 (Week 2): Pt will complete TTB transfer with mod A   Emmett Bracknell E Izyk Marty 08/01/2021, 7:35 AM

## 2021-08-01 NOTE — Progress Notes (Signed)
Physical Therapy Weekly Progress Note  Patient Details  Name: Diane Webb MRN: [id number] Date of Birth: 1926/12/19  Beginning of progress report period: July 25, 2021 End of progress report period: August 01, 2021  Patient has met 0 of 3 short term goals. Pt demonstrates steady progress towards her long term goals. Pt is currently able to perform bed mobility with supervision and use of bed features, sit<>stands using UE support with mod assist (demoing poor eccentric control when going to sit), squat pivot bed<>chair transfers with mod/max assist. Pt has participated in gait training using litegait harness for safety reaching up to 155f in 1 walk with pt demoing ability to advance R LE without assist and stabilize during stance phase without signs of overt knee buckling. Pt will benefit from continued CIR level therapies to further progress her independence with functional mobility.    Patient continues to demonstrate the following deficits muscle weakness, decreased cardiorespiratoy endurance, impaired timing and sequencing, abnormal tone, unbalanced muscle activation, decreased coordination, and decreased motor planning, and decreased standing balance, decreased postural control, hemiplegia, and decreased balance strategies and therefore will continue to benefit from skilled PT intervention to increase functional independence with mobility.  Patient progressing toward long term goals..  Continue plan of care.  PT Short Term Goals Week 1:  PT Short Term Goal 1 (Week 1): pt will transfer sit<>stand with LRAD and min A PT Short Term Goal 1 - Progress (Week 1): Progressing toward goal PT Short Term Goal 2 (Week 1): pt will transfer bed<>chair with LRAD and mod A consistantly PT Short Term Goal 2 - Progress (Week 1): Progressing toward goal PT Short Term Goal 3 (Week 1): pt will perform simulated car transfer with LRAD and mod A PT Short Term Goal 3 - Progress (Week 1): Progressing toward  goal Week 2:  PT Short Term Goal 1 (Week 2): pt will transfer sit<>stand with LRAD and min A PT Short Term Goal 2 (Week 2): pt will transfer bed<>chair with LRAD and mod A consistantly PT Short Term Goal 3 (Week 2): pt will ambulate 133fwith LRAD and mod A of 1  Skilled Therapeutic Interventions/Progress Updates:  Ambulation/gait training;Discharge planning;Functional mobility training;Psychosocial support;Therapeutic Activities;Visual/perceptual remediation/compensation;Balance/vestibular training;Disease management/prevention;Neuromuscular re-education;Skin care/wound management;Therapeutic Exercise;Wheelchair propulsion/positioning;Cognitive remediation/compensation;DME/adaptive equipment instruction;Pain management;Splinting/orthotics;UE/LE Strength taining/ROM;Community reintegration;Functional electrical stimulation;Patient/family education;Stair training;UE/LE Coordination activities   Therapy Documentation Precautions:  Precautions Precautions: Fall Precaution Comments: R hemiparesis; Pt's R knee has tendency to buckle during transfers Restrictions Weight Bearing Restrictions: No  Diane Webb PT, DPT, NCS, CSRS  [place]ifeeDPT  08/01/2021, 7:38 AM

## 2021-08-01 NOTE — Progress Notes (Signed)
PROGRESS NOTE   Subjective/Complaints: Complains of right sided leg pain- barely notices that the knee sleeve is there Has no other complaints  ROS:  Pt denies SOB, abd pain, CP, N/V/C/D, and vision changes  Objective:   No results found. No results for input(s): WBC, HGB, HCT, PLT in the last 72 hours.  No results for input(s): NA, K, CL, CO2, GLUCOSE, BUN, CREATININE, CALCIUM in the last 72 hours.   Intake/Output Summary (Last 24 hours) at 08/01/2021 1136 Last data filed at 07/31/2021 1812 Gross per 24 hour  Intake 240 ml  Output --  Net 240 ml        Physical Exam: Vital Signs Blood pressure (!) 142/81, pulse 75, temperature 98.2 F (36.8 C), resp. rate 17, height 4\' 6"  (1.372 m), weight 66.1 kg, SpO2 97 %. Gen: no distress, normal appearing HEENT: oral mucosa pink and moist, NCAT Cardio: Reg rate Chest: normal effort, normal rate of breathing Abd: soft, non-distended Ext: no edema Psych: pleasant, normal affect  Skin: intact Neurologic: Cranial nerves II through XII intact, motor strength is 5/5 in left and 4/5 RIght  deltoid, bicep, tricep, grip, hip flexor, knee extensors, ankle dorsiflexor and plantar flexor Sensory exam normal sensation to light touch and proprioception in left upper and lower extremities, absent on right side  Sensory ataxia  Right finger nose finger and RIght heel shin Musculoskeletal: Full range of motion in all 4 extremities. No joint swelling   Assessment/Plan: 1. Functional deficits which require 3+ hours per day of interdisciplinary therapy in a comprehensive inpatient rehab setting. Physiatrist is providing close team supervision and 24 hour management of active medical problems listed below. Physiatrist and rehab team continue to assess barriers to discharge/monitor patient progress toward functional and medical goals  Care Tool:  Bathing    Body parts bathed by patient:  Right arm, Left arm, Chest, Abdomen, Right upper leg, Left upper leg, Left lower leg, Face   Body parts bathed by helper: Right lower leg, Front perineal area, Buttocks     Bathing assist Assist Level: Supervision/Verbal cueing     Upper Body Dressing/Undressing Upper body dressing   What is the patient wearing?: Pull over shirt    Upper body assist Assist Level: Contact Guard/Touching assist    Lower Body Dressing/Undressing Lower body dressing      What is the patient wearing?: Pants     Lower body assist Assist for lower body dressing: Minimal Assistance - Patient > 75%     Toileting Toileting    Toileting assist Assist for toileting: Moderate Assistance - Patient 50 - 74%     Transfers Chair/bed transfer  Transfers assist     Chair/bed transfer assist level: Minimal Assistance - Patient > 75% (stand pivot with Rw)     Locomotion Ambulation   Ambulation assist   Ambulation activity did not occur: Safety/medical concerns (fatigue, weakness, decreased balance/postural control)  Assist level: Moderate Assistance - Patient 50 - 74% (able to perform with only 1person assist) Assistive device: Lite Gait Max distance: 28ft   Walk 10 feet activity   Assist  Walk 10 feet activity did not occur: Safety/medical concerns (fatigue, weakness, decreased  balance/postural control)  Assist level: 2 helpers Assistive device: Other (comment) (L handrail and RUE around therapist)   Walk 50 feet activity   Assist Walk 50 feet with 2 turns activity did not occur: Safety/medical concerns (fatigue, weakness, decreased balance/postural control)         Walk 150 feet activity   Assist Walk 150 feet activity did not occur: Safety/medical concerns (fatigue, weakness, decreased balance/postural control)         Walk 10 feet on uneven surface  activity   Assist Walk 10 feet on uneven surfaces activity did not occur: Safety/medical concerns (fatigue, weakness,  decreased balance/postural control)         Wheelchair     Assist Is the patient using a wheelchair?: Yes Type of Wheelchair: Manual    Wheelchair assist level: Supervision/Verbal cueing Max wheelchair distance: 144ft    Wheelchair 50 feet with 2 turns activity    Assist        Assist Level: Supervision/Verbal cueing   Wheelchair 150 feet activity     Assist      Assist Level: Supervision/Verbal cueing   Blood pressure (!) 142/81, pulse 75, temperature 98.2 F (36.8 C), resp. rate 17, height 4\' 6"  (1.372 m), weight 66.1 kg, SpO2 97 %.    Medical Problem List and Plan: 1.  Right side weakness secondary to left lateral thalamic infarct likely small vessel disease             -patient may  shower             -ELOS/Goals: 14-16d  Continue PT and OT-  2.  Impaired mobility: continue Lovenox             -antiplatelet therapy: Aspirin 81 mg daily and Plavix 75 mg day x3 weeks then aspirin alone 3. Right lower extremity pain: Continue Tylenol as needed 4. Insomnia: continue Melatonin 10 mg nightly as needed sleep             -antipsychotic agents: N/A 5. Neuropsych: This patient is capable of making decisions on her own behalf. 6. Skin/Wound Care: Routine skin checks 7. Fluids/Electrolytes/Nutrition: Routine in and outs with follow-up chemistries 8.  Permissive hypertension.  Patient on losartan/HCTZ 100/25 daily prior to admission.  Restart Losartan 12.5mg  daily.  9.  Hyperlipidemia.  LDL 198. Continue Lipitor 10.  CKD stage III.  Baseline creatinine 1.52.  Cr reviewed and current better than baseline, monitor as needed.  11. Tachycardia: resolved, d/c coreg 12. Obesity BMI 35.99: change diet to heart healthy/carb modified.  13. Right knee buckling: knee sleeve ordered 14. GERD without esophagitis: change protonix to PRN. 15. Constipation- 10/30- will give sorbitol 30 cc x1.    LOS: 8 days A FACE TO FACE EVALUATION WAS PERFORMED  11/30  Diane Webb 08/01/2021, 11:36 AM

## 2021-08-01 NOTE — Progress Notes (Signed)
Occupational Therapy Session Note  Patient Details  Name: Diane Webb MRN: [id number] Date of Birth: 10/07/1926  Today's Date: 08/01/2021 OT Individual Time: 1314-3888 OT Individual Time Calculation (min): 30 min    Short Term Goals: Week 2:  OT Short Term Goal 1 (Week 2): Pt will complete toilet transfer with mod A OT Short Term Goal 2 (Week 2): Pt will complete functional squat pivot transfer for prep for ADLs with min A OT Short Term Goal 3 (Week 2): Pt will complete TTB transfer with mod A  Skilled Therapeutic Interventions/Progress Updates:    Pt in wheelchair to start session.  Took her down to the [facility] gym with mod assist for transfer to the [place] with the RW.  She was able to complete 1 set of 5 mins with resistance set on level 4.  She was able to maintain 50 steps per min.  After a brief rest, she completed 3 more mins with isolated use of her LEs only and steps per min at 35.  Slight decreased right knee control noted with occasion external rotation seen secondary to weakness.  The final set of 3 mins was completed with isolated use of the UEs at approximately 50 steps per min.  Finished session with transfer back to the wheelchair at [facility] assist level and then transfer back to the bed at the same level.  Call button and phone in reach with safety alarm in place.      Therapy Documentation Precautions:  Precautions Precautions: Fall Precaution Comments: R hemiparesis; Pt's R knee has tendency to buckle during transfers Restrictions Weight Bearing Restrictions: No  Pain: Pain Assessment Pain Scale: Faces Pain Score: 0-No pain ADL: See Care Tool Section for some details of mobility and selfcare   Therapy/Group: Individual Therapy  Keva Darty OTR/L 08/01/2021, 4:10 PM

## 2021-08-02 DIAGNOSIS — I63512 Cerebral infarction due to unspecified occlusion or stenosis of left middle cerebral artery: Secondary | ICD-10-CM | POA: Diagnosis not present

## 2021-08-02 NOTE — Progress Notes (Addendum)
Occupational Therapy Session Note  Patient Details  Name: Diane Webb MRN: [id number] Date of Birth: 1927-05-05  Today's Date: 08/02/2021 OT Individual Time: 5956-3875 OT Individual Time Calculation (min): 53 min    Short Term Goals: Week 2:  OT Short Term Goal 1 (Week 2): Pt will complete toilet transfer with mod A OT Short Term Goal 2 (Week 2): Pt will complete functional squat pivot transfer for prep for ADLs with min A OT Short Term Goal 3 (Week 2): Pt will complete TTB transfer with mod A  Skilled Therapeutic Interventions/Progress Updates:  Skilled OT intervention completed with focus on functional transfers. Pt received seated in w/c, agreeable to session. Unable to accurately recall the 5 steps for standing prep, however able to complete 3 sit > stands using RW with Max A on first attempt and mod A on 2/3. Pt requires cues for pushing up from w/c, as well as cues to reach back for controlled descent during stand > sit. Reported the need to have a BM. Completed w/c sit > stand with mod A using RW, then stand pivot <> BSC with mod A and cues required for positioning of RLE due to decreased proprioception and poor motor planning/sequencing. Able to complete anterior pericare with supervision, requiring mod A to clean posterior pericare. Pt able to complete forward lean using both feet supported on floor and RW for BUEs. Completed sit > stand with min A with pt pushing up on both BSC arm rests, and therapist threading brief on with total A and pt completing pants over hips with min A needed to maintain balance. Nurse notified of BM. Mass practice of sit > stands completed with pt completing 3/3 with min-mod A, on third attempt pt able to march in place with cues needed to alternate weight shift, however able to tolerate standing with min A. Pt required seated rest breaks in between standing trials. Pt left seated in w/c, with chair alarm on and call bell left on pt's intact side.  Therapy  Documentation Precautions:  Precautions Precautions: Fall Precaution Comments: R hemiparesis; Pt's R knee has tendency to buckle during transfers Restrictions Weight Bearing Restrictions: No  Pain: No c/o pain during session   Therapy/Group: Individual Therapy  Arslan Kier E Manreet Kiernan 08/02/2021, 7:41 AM

## 2021-08-02 NOTE — Progress Notes (Signed)
Occupational Therapy Session Note  Patient Details  Name: Diane Webb MRN: [id number] Date of Birth: 1927-01-06  Today's Date: 08/02/2021 OT Individual Time: 3762-8315 OT Individual Time Calculation (min): 60 min    Short Term Goals: Week 1:  OT Short Term Goal 1 (Week 1): Pt will perform sit <> stand with mod A in prep for ADLs OT Short Term Goal 1 - Progress (Week 1): Met OT Short Term Goal 2 (Week 1): Pt will complete LB dressing with min A OT Short Term Goal 2 - Progress (Week 1): Met OT Short Term Goal 3 (Week 1): Pt will complete toilet transfer with mod A OT Short Term Goal 3 - Progress (Week 1): Progressing toward goal  Pt received in w/c with no pain but reporting swelling of R ankle. Teds applied after checked for order.  No pain noted in R leg not warm to touch  Therapeutic activity Standing tolerance at tabletop with [facility] activity with blocks turning, stacking, sorting and finger>palm translation up to 4 wooden blocks. Pt requires VC for sorting into colors correctly as initially sorting by color and shape. Pt able to complete all sit to stand with tactile input into R knee and MIN A pushing BUE off arm rest. VC for controled decent to chair. Block work in prep for coin in [employer] however pt demo diffculty with anything smaller than a quarter. Able ot complete up to 3 quarters finger<>palm translation with many grip slips noted.   Pt left at end of session in bed with exit alarm on, call light in reach and all needs met   Therapy Documentation Precautions:  Precautions Precautions: Fall Precaution Comments: R hemiparesis; Pt's R knee has tendency to buckle during transfers Restrictions Weight Bearing Restrictions: No  Therapy/Group: individual  Tonny Branch 08/02/2021, 6:52 AM

## 2021-08-02 NOTE — Progress Notes (Signed)
Occupational Therapy Session Note  Patient Details  Name: Diane Webb MRN: [id number] Date of Birth: Jun 25, 1927  Today's Date: 08/02/2021 OT Individual Time: 0930-1015 OT Individual Time Calculation (min): 45 min    Short Term Goals: Week 2:  OT Short Term Goal 1 (Week 2): Pt will complete toilet transfer with mod A OT Short Term Goal 2 (Week 2): Pt will complete functional squat pivot transfer for prep for ADLs with min A OT Short Term Goal 3 (Week 2): Pt will complete TTB transfer with mod A  Skilled Therapeutic Interventions/Progress Updates:    Pt received in bed with no c/o pain and agreeable to OT session focusing on ADL retraining. Pt reports R foot numb with tingling sensation after stroke.   ADL: Skilled interventions include: Pt completes EOB > wc to L side squat > pivot with mod A for R knee block. Pt performs bathing at sink level seated in wc with setup/supervision. Requires skilled min cuing for additional processing and sequencing. Able to incorporate R UE into bimanual tasks - used R hand to hold toothbrush without cuing and functionally reach for objects near sink. Completed 3 x sit <> stands with min A. Mod directional cuing for hand placement as pt attempts to place R hand on far L side when standing. Pt with increased fatigue after activity, and requires short rest break. Able to don L sock and shoe with setup, but requires min A for R sock and shoe. Pt competes oral care with setup. Pt left at end of session in wc with exit belt alarm on, call light in reach and all needs met.   Therapy Documentation Precautions:  Precautions Precautions: Fall Precaution Comments: R hemiparesis; Pt's R knee has tendency to buckle during transfers Restrictions Weight Bearing Restrictions: No  Therapy/Group: Individual Therapy  Adelaido Nicklaus 08/02/2021, 7:15 AM

## 2021-08-02 NOTE — Progress Notes (Signed)
Physical Therapy Session Note  Patient Details  Name: Diane Webb MRN: [id number] Date of Birth: 03-25-1927  Today's Date: 08/02/2021 PT Individual Time: 1016-1058 PT Individual Time Calculation (min): 42 min   Short Term Goals: Week 2:  PT Short Term Goal 1 (Week 2): pt will transfer sit<>stand with LRAD and min A PT Short Term Goal 2 (Week 2): pt will transfer bed<>chair with LRAD and mod A consistantly PT Short Term Goal 3 (Week 2): pt will ambulate 4ft with LRAD and mod A of 1  Skilled Therapeutic Interventions/Progress Updates:     Pt received seated in WC and agrees to therapy. No complaint of pain. WC transport to gym for time management. Pt performs sit to stand with minA/modA and RW, and ambulates x20' with modA with cues for upright gaze to improve posture and balance, engaging R hip abductors due to R lower extremity adduction and slight internal rotation during swing phase. Pt reports fatigue in arms and requests rest break. Sit to supine with verbal cues for sequencing and positioning. Pt performs supine there-ex for NMR of R lower extremity, including 3x10 bridges with 10 count hold on final rep, 3x10 clamshells in L sidelying, and 3x10 AAROM L hip abduction. Pt performs supine to sit with cues on positioning and sequencing. Pt then ambulates x40' with RW and minA, with improved gait pattern relative to initial bout and maintaining more consistent BOS. Pt performs stand step transfer from [facility] to toilet with minA. Continent of urine and independent with pericare. Pt left seated with alarm intact and all needs within reach.  Therapy Documentation Precautions:  Precautions Precautions: Fall Precaution Comments: R hemiparesis; Pt's R knee has tendency to buckle during transfers Restrictions Weight Bearing Restrictions: No  Therapy/Group: Individual Therapy  Beau Fanny, PT, DPT 08/02/2021, 12:45 PM

## 2021-08-02 NOTE — Patient Care Conference (Signed)
Inpatient RehabilitationTeam Conference and Plan of Care Update Date: 08/02/2021   Time: 11:37 AM    Patient Name: Diane Webb      Medical Record Number: [id number]  Date of Birth: 1927/04/14 Sex: Female         Room/Bed: 5C04C/5C04C-01 Payor Info: Payor: MEDICARE / Plan: MEDICARE PART A AND B / Product Type: *No Product type* /    Admit Date/Time:  07/24/2021  3:12 PM  Primary Diagnosis:  Acute stroke due to occlusion of left middle cerebral artery [facility])  Hospital Problems: Principal Problem:   Acute stroke due to occlusion of left middle cerebral artery (HCC) Active Problems:   Left thalamic infarction [facility])    Expected Discharge Date: Expected Discharge Date: 08/15/21  Team Members Present: Physician leading conference: Dr. Sula Soda Social Worker Present: Dossie Der, LCSW Nurse Present: Chana Bode, RN PT Present: Malachi Pro, PT OT Present: Other (comment) [facility] Cheyenne Adas, OT) PPS Coordinator present : Fae Pippin, SLP     Current Status/Progress Goal Weekly Team Focus  Bowel/Bladder     Continent        Swallow/Nutrition/ Hydration             ADL's   Set up- grooming, supervision- UBD, Min A- LBD, mod A squat/stand pivot transfers, Max A- toileting, mod-max sit > stands  CGA functional transfers, supervision B/D  ADL retraining, AE education, functional transfers to [facility] and TTB, functional use of RUE   Mobility   bed mobility supervision, squat/stand<>pivot transfers mod A, sit<>stands mod A, gait 76ft in Litegait but 73ft in hallway with L rail and +2 assist, supervision WC mobility 134ft  supervision/min A  functional mobility/transfers, generalized strengthening, dynamic standing balance/coordination, gait training, NMR, and improved activity tolerance.   Communication             Safety/Cognition/ Behavioral Observations            Pain     N/A        Skin     Stage 1 on buttocks and hemorrhoids   Skin healing and no new issues   Foam to  buttocks, barrier cream and prn for hemorrhoids    Discharge Planning:  Pt d/c home with daughter   Team Discussion: Medically stable per MD. Knee buckling continues; sleeve added.  Patient on target to meet rehab goals: yes, currently needs set up for upper body care and min assist for lower body care/dressing. Min- mod assist for transfers and max assist for toileting. Able to ambulate up to 40' with min assist. Goals for discharge set for CGA.  *See Care Plan and progress notes for long and short-term goals.   Revisions to Treatment Plan:  None  Teaching Needs: Safety, medications, secondary risk management, toileting and transfers, etc  Current Barriers to Discharge: Decreased caregiver support and Home enviroment access/layout  Possible Resolutions to Barriers: Family education with daughter     Medical Summary Current Status: morbid obesity, right knee buckling, right lower extremity numbness and tingling, denies pain, moving bowels and bladder well, left MCA CVA    Barriers to Discharge Comments: morbid obesity, right knee buckling, right lower extremity numbness and tingling, left MCA CVA Possible Resolutions to [employer] Focus: provide dietary education, continue right knee sleeve, continue aspirin and lipitor   Continued Need for Acute Rehabilitation Level of Care: The patient requires daily medical management by a physician with specialized training in physical medicine and rehabilitation for the following reasons: Direction of a multidisciplinary  physical rehabilitation program to maximize functional independence : Yes Medical management of patient stability for increased activity during participation in an intensive rehabilitation regime.: Yes   I attest that I was present, lead the team conference, and concur with the assessment and plan of the team.   Chana Bode B 08/02/2021, 3:42 PM

## 2021-08-02 NOTE — Progress Notes (Signed)
PROGRESS NOTE   Subjective/Complaints: "Im feeling pretty good" Urinating and moving bowels well Continues to have right lower extremity numbness and tingling, but denies pain Vitals stable  ROS:  Pt denies SOB, abd pain, CP, N/V/C/D, and vision changes  Objective:   No results found. No results for input(s): WBC, HGB, HCT, PLT in the last 72 hours.  No results for input(s): NA, K, CL, CO2, GLUCOSE, BUN, CREATININE, CALCIUM in the last 72 hours.   Intake/Output Summary (Last 24 hours) at 08/02/2021 1312 Last data filed at 08/02/2021 1310 Gross per 24 hour  Intake 710 ml  Output --  Net 710 ml        Physical Exam: Vital Signs Blood pressure 122/69, pulse 72, temperature 98.2 F (36.8 C), resp. rate 17, height 4\' 6"  (1.372 m), weight 66.1 kg, SpO2 98 %. Gen: no distress, normal appearing, BMI 35.14 HEENT: oral mucosa pink and moist, NCAT Cardio: Reg rate Chest: normal effort, normal rate of breathing Abd: soft, non-distended Ext: no edema Psych: pleasant, normal affect  Skin: intact Neurologic: Cranial nerves II through XII intact, motor strength is 5/5 in left and 4/5 RIght  deltoid, bicep, tricep, grip, hip flexor, knee extensors, ankle dorsiflexor and plantar flexor Sensory exam normal sensation to light touch and proprioception in left upper and lower extremities, absent on right side  Sensory ataxia  Right finger nose finger and RIght heel shin Musculoskeletal: Full range of motion in all 4 extremities. No joint swelling   Assessment/Plan: 1. Functional deficits which require 3+ hours per day of interdisciplinary therapy in a comprehensive inpatient rehab setting. Physiatrist is providing close team supervision and 24 hour management of active medical problems listed below. Physiatrist and rehab team continue to assess barriers to discharge/monitor patient progress toward functional and medical  goals  Care Tool:  Bathing    Body parts bathed by patient: Right arm, Left arm, Chest, Abdomen, Right upper leg, Left upper leg, Left lower leg, Face   Body parts bathed by helper: Right lower leg, Front perineal area, Buttocks     Bathing assist Assist Level: Supervision/Verbal cueing     Upper Body Dressing/Undressing Upper body dressing   What is the patient wearing?: Pull over shirt    Upper body assist Assist Level: Contact Guard/Touching assist    Lower Body Dressing/Undressing Lower body dressing      What is the patient wearing?: Pants     Lower body assist Assist for lower body dressing: Minimal Assistance - Patient > 75%     Toileting Toileting    Toileting assist Assist for toileting: Moderate Assistance - Patient 50 - 74%     Transfers Chair/bed transfer  Transfers assist     Chair/bed transfer assist level: Moderate Assistance - Patient 50 - 74% (squat pivot)     Locomotion Ambulation   Ambulation assist   Ambulation activity did not occur: Safety/medical concerns (fatigue, weakness, decreased balance/postural control)  Assist level: Moderate Assistance - Patient 50 - 74% Assistive device: Lite Gait Max distance: 175ft   Walk 10 feet activity   Assist  Walk 10 feet activity did not occur: Safety/medical concerns (fatigue, weakness, decreased balance/postural control)  Assist level: 2 helpers Assistive device: Other (comment) (L handrail and RUE around therapist)   Walk 50 feet activity   Assist Walk 50 feet with 2 turns activity did not occur: Safety/medical concerns (fatigue, weakness, decreased balance/postural control)         Walk 150 feet activity   Assist Walk 150 feet activity did not occur: Safety/medical concerns (fatigue, weakness, decreased balance/postural control)         Walk 10 feet on uneven surface  activity   Assist Walk 10 feet on uneven surfaces activity did not occur: Safety/medical concerns  (fatigue, weakness, decreased balance/postural control)         Wheelchair     Assist Is the patient using a wheelchair?: Yes Type of Wheelchair: Manual    Wheelchair assist level: Supervision/Verbal cueing Max wheelchair distance: 157ft    Wheelchair 50 feet with 2 turns activity    Assist        Assist Level: Supervision/Verbal cueing   Wheelchair 150 feet activity     Assist      Assist Level: Supervision/Verbal cueing   Blood pressure 122/69, pulse 72, temperature 98.2 F (36.8 C), resp. rate 17, height 4\' 6"  (1.372 m), weight 66.1 kg, SpO2 98 %.    Medical Problem List and Plan: 1.  Right side weakness secondary to left lateral thalamic infarct likely small vessel disease             -patient may  shower             -ELOS/Goals: 14-16d  -Interdisciplinary Team Conference today   2.  Impaired mobility: continue Lovenox             -antiplatelet therapy: Aspirin 81 mg daily and Plavix 75 mg day x3 weeks then aspirin alone 3. Right lower extremity pain: continue Tylenol as needed 4. Insomnia:continue Melatonin 10 mg nightly as needed sleep             -antipsychotic agents: N/A 5. Neuropsych: This patient is capable of making decisions on her own behalf. 6. Skin/Wound Care: Routine skin checks 7. Fluids/Electrolytes/Nutrition: Routine in and outs with follow-up chemistries 8.  Permissive hypertension.  Patient on losartan/HCTZ 100/25 daily prior to admission.  Restart Losartan 12.5mg  daily- much better control. Monitor creatinine weekly.   9.  Hyperlipidemia.  LDL 198. Continue Lipitor 10.  CKD stage III.  Baseline creatinine 1.52.  Cr reviewed and current better than baseline, monitor as needed.  11. Tachycardia: resolved, d/c coreg 12. Obesity BMI 35.99: change diet to heart healthy/carb modified.  13. Right knee buckling: knee sleeve ordered 14. GERD without esophagitis: change protonix to PRN. 15. Constipation- 10/30- will give sorbitol 30 cc x1.     LOS: 9 days A FACE TO FACE EVALUATION WAS PERFORMED  Diane Webb P Darlisha Kelm 08/02/2021, 1:12 PM

## 2021-08-02 NOTE — Progress Notes (Signed)
Patient ID: Diane Webb, female   DOB: July 24, 1927, 87 y.o.   MRN: [id number]  Team Conference Report to Patient/Family  Team Conference discussion was reviewed with the patient and caregiver, including goals, any changes in plan of care and target discharge date.  Patient and caregiver express understanding and are in agreement.  The patient has a target discharge date of 08/15/21.  Sw met with patient and called pt daughter Diane Webb). Provided conference updates. Pt medically stable and doing well. Patient practicing with AD navigation, transfer, balance and knee buckling. No additional questions or concerns, sw will continue to follow up.   [name] 08/02/2021, 1:14 PM

## 2021-08-03 DIAGNOSIS — I63512 Cerebral infarction due to unspecified occlusion or stenosis of left middle cerebral artery: Secondary | ICD-10-CM | POA: Diagnosis not present

## 2021-08-03 NOTE — Progress Notes (Signed)
Physical Therapy Session Note  Patient Details  Name: Diane Webb MRN: [id number] Date of Birth: [birth year]  Today's Date: 08/03/2021 PT Individual Time: 1010-1104 and 6283-6629 PT Individual Time Calculation (min): 54 min and 47 min   Short Term Goals: Week 2:  PT Short Term Goal 1 (Week 2): pt will transfer sit<>stand with LRAD and min A PT Short Term Goal 2 (Week 2): pt will transfer bed<>chair with LRAD and mod A consistantly PT Short Term Goal 3 (Week 2): pt will ambulate 71ft with LRAD and mod A of 1  Skilled Therapeutic Interventions/Progress Updates:    Session 1: Pt received supine in bed and agreeable to therapy session. Reports R ankle sore and currently utilizing ice on it - details below. Supine>sitting R EOB, supervision, HOB slightly elevated and using bedrails. Sitting EOB donned tennis shoes total assist for time management. Pt reports need to use bathroom. Sit>stand EOB>RW with mod assist for lifting to rise - once in standing, continues to lack hip extensor activation to bring trunk upright resulting in pt leaning heavily forward onto RW. L stand pivot EOB>w/c using RW with light mod assist for balance and AD management due to pt staying in the forward flexed posture during the transfer as opposed to extending hips and maintaining upright.   Stand pivot w/c<>BSC over toilet using UE support on grab bar with mod assist for balance and facilitation for R/L weight shifting onto stance limb - continues to have excessive hip/trunk forward flexion with lack of glute activation - pt able to step RLE during transfer without physical assistance but noted her R ankle moving towards inversion rolling requiring cuing to correct. Continent of bladder. Sit>stand with mod assist for rising to stand and then min assist for balance during total assist peri-care. Nursing staff in/out.  Transported to/from gym in w/c for time management and energy conservation. Sit>stand w/c>RW with mod assist  for lifting to stand - delayed hip extension to bring trunk upright with pt slow to rise, requiring increased time.   Donned R LE ankle DF assist ACE wrap. Gait training 60ft using RW (including 1x 180degree turn) with heavy min assist for safety - continues to have excessive hip flexion causing forward trunk lean  requiring cuing and facilitation for increased hip extensor activation to improve upright posture - excessive R LE adduction with slight internal rotation during swing phase - demos good R knee stability throughout during stance. Gait training 92ft 2x (1x 180degree turn each time) with lighter min assist - demos improved upright posture and improved R LE swing phase mechanics. HR 96bpm and SpO2 96% after gait.   Discussed with PA trialing ASO (soft ankle brace) prior to AFO to address the ankle inversion rolling Jesusita Oka, PA ordered. Notified nursing staff to monitor R ankle during transfers and updated safety plan.  At end of session pt left seated in w/c with needs in reach and seat belt alarm on.   Session 2: Pt received sitting in w/c with her daughter and son-in-law present and pt agreeable to therapy session. Therapist briefly updated family on pt's progress. Transported to/from gym in w/c for time management and energy conservation.   Donned R LE ankle DF assist ACE wrap. Sit>stands w/c>RW with mod progressing towards min assist for lifting to stand - continues to have delayed trunk/hip/knee extension with slow rise into standing - therapist requires cuing to ensure R foot positioning laterally to avoid ankle inversion rolling.   Gait training 75ft 2x using  RW with min assist for balance - demos reciprocal stepping pattern but slow gait speed - decreased  RLE foot clearance during swing but improving positioning on initial contact (no significant hip adduction or internal rotation noted) - continues to have excessive hip flexion with forward trunk lean relying more heavily on B UE support  on RW, cuing and facilitation throughout for improvement.  Repeated L LE step up/down on/off 1st 3" step using B HR support with mod assist for safety - pt with even more exaggerated excessive hip flexion and forward trunk lean during this task requiring multimodal cuing and manual facilitation for increased glute activation 2x5reps with L LE then progressed to R LE x3 reps with same assist and no significant R knee instability noted.  Block practice stand pivot transfers w/c<>EOM using RW with mod progressing to min assist for lifting to stand and then only min assist for balance while turning - pt with awareness to step R LE laterally and monitor ankle positioning. Transported back up to room and performed R stand pivot to EOB using RW as just described. Doffed ACE wrap, TED hose, and R knee sleeve. Sit>supine using bedrail supervision. Pt left supine in bed, HOB elevated, with needs in reach and bed alarm on.  Therapy Documentation Precautions:  Precautions Precautions: Fall Precaution Comments: R hemiparesis; Pt's R knee has tendency to buckle during transfers Restrictions Weight Bearing Restrictions: No   Pain:  Session 1: Reports R knee "sore" when going from sit>stand. And then "a little strain" in R knee during gait training - provided seated rest breaks for pain management with pt reporting symptom relief. In addition to R ankle soreness upon thearpist arrival - utilizing ice on therapist arrival and then therapist utilized ACE wrap for ankle support throughout session.   Session 2: No reports or indications of pain during session.    Therapy/Group: Individual Therapy  Ginny Forth , PT, DPT, NCS, CSRS  08/03/2021, 7:51 AM

## 2021-08-03 NOTE — Progress Notes (Signed)
Occupational Therapy Session Note  Patient Details  Name: Anneka Studer MRN: [id number] Date of Birth: 1927/08/30  Today's Date: 08/03/2021 OT Individual Time: 8416-6063  &  1500-1530 OT Individual Time Calculation (min): 75 min   &   30 min   Short Term Goals: Week 1:  OT Short Term Goal 1 (Week 1): Pt will perform sit <> stand with mod A in prep for ADLs OT Short Term Goal 1 - Progress (Week 1): Met OT Short Term Goal 2 (Week 1): Pt will complete LB dressing with min A OT Short Term Goal 2 - Progress (Week 1): Met OT Short Term Goal 3 (Week 1): Pt will complete toilet transfer with mod A OT Short Term Goal 3 - Progress (Week 1): Progressing toward goal Week 2:  OT Short Term Goal 1 (Week 2): Pt will complete toilet transfer with mod A OT Short Term Goal 2 (Week 2): Pt will complete functional squat pivot transfer for prep for ADLs with min A OT Short Term Goal 3 (Week 2): Pt will complete TTB transfer with mod A  Skilled Therapeutic Interventions/Progress Updates:    AM session:   Patient in bed, alert and ready for therapy session.  She denies pain and states that she would like to get washed up sitting at the edge of the bed this morning.  Supine to sitting edge of bed with CS.  She is able to complete sponge bath with  set up/min A   .   Upper body dressing with min A     .   Lower body dressing with  min A, mod A for clothing management in stance    .   Donns teds and shoes with   max A    .  Oral care with set up, brushing hair min A due to limited OH reach - good recall and attempts to use right hand functionally t/o adl.  Unsupported sitting edge of bed without LOB, CS level.  Sit to stand min A with cues for body position/right foot awareness (tends to invert), stand pivot with RW CGA with cues for sequencing, walker placement to w/c and mat table.   Completed unsupported sitting activities with focus on right hand coordination/dexterity, provided hand writing activities with good  carryover.  Completed seated core, posture, trunk mobility, scapular mobility and facilitation of reach activities with good tolerance and improved activation noted.  Stand pivot transfer min A back to bed, sit to supine min A.   She remained in bed at close of session to elevate right leg, bed alarm set and call bell in reach.      PM session:   Patient seated in w/c, ready for therapy session.  She denies pain at this time, occ pain in right knee with sit to stand during session.   Completed standing weight shift activities x 2 with bed rail for support focusing on upright posture - min A to stand able to maintain with min A and cues for approx 2-3 minutes.  Completed seated posture, core strengthening and scapula/shoulder mobility activities.    Sit to stand and stand pivot transfer to/from commode with mod A for continent void -  max A for clothing management, she was able to complete hygiene with set up .  She remained seated in w/c at close of session, seat belt alarm set and call bell in hand.       Therapy Documentation Precautions:  Precautions Precautions: Fall Precaution Comments: R  hemiparesis; Pt's R knee has tendency to buckle during transfers Restrictions Weight Bearing Restrictions: No   Therapy/Group: Individual Therapy  Carlos Levering 08/03/2021, 7:34 AM

## 2021-08-03 NOTE — Progress Notes (Signed)
PROGRESS NOTE   Subjective/Complaints: "Im feeling pretty good" Urinating and moving bowels well Continues to have right lower extremity numbness and tingling, but denies pain Vitals stable  ROS:  Pt denies SOB, abd pain, CP, N/V/C/D, and vision changes  Objective:   No results found. No results for input(s): WBC, HGB, HCT, PLT in the last 72 hours.  No results for input(s): NA, K, CL, CO2, GLUCOSE, BUN, CREATININE, CALCIUM in the last 72 hours.   Intake/Output Summary (Last 24 hours) at 08/03/2021 1144 Last data filed at 08/03/2021 0738 Gross per 24 hour  Intake 592 ml  Output 300 ml  Net 292 ml        Physical Exam: Vital Signs Blood pressure 126/63, pulse 81, temperature 98.1 F (36.7 C), resp. rate 16, height 4\' 6"  (1.372 m), weight 66.1 kg, SpO2 94 %. Gen: no distress, normal appearing HEENT: oral mucosa pink and moist, NCAT Cardio: Reg rate Chest: normal effort, normal rate of breathing Abd: soft, non-distended Ext: no edema Psych: pleasant, normal affect  Skin: intact Neurologic: Cranial nerves II through XII intact, motor strength is 5/5 in left and 4/5 RIght  deltoid, bicep, tricep, grip, hip flexor, knee extensors, ankle dorsiflexor and plantar flexor Sensory exam normal sensation to light touch and proprioception in left upper and lower extremities, absent on right side  Sensory ataxia  Right finger nose finger and RIght heel shin Musculoskeletal: Full range of motion in all 4 extremities. No joint swelling   Assessment/Plan: 1. Functional deficits which require 3+ hours per day of interdisciplinary therapy in a comprehensive inpatient rehab setting. Physiatrist is providing close team supervision and 24 hour management of active medical problems listed below. Physiatrist and rehab team continue to assess barriers to discharge/monitor patient progress toward functional and medical goals  Care  Tool:  Bathing    Body parts bathed by patient: Right arm, Left arm, Chest, Front perineal area, Abdomen, Right upper leg, Left upper leg, Face   Body parts bathed by helper: Buttocks, Right lower leg, Left lower leg     Bathing assist Assist Level: Supervision/Verbal cueing     Upper Body Dressing/Undressing Upper body dressing   What is the patient wearing?: Pull over shirt    Upper body assist Assist Level: Set up assist    Lower Body Dressing/Undressing Lower body dressing      What is the patient wearing?: Incontinence brief, Pants     Lower body assist Assist for lower body dressing: Minimal Assistance - Patient > 75%     Toileting Toileting    Toileting assist Assist for toileting: Moderate Assistance - Patient 50 - 74%     Transfers Chair/bed transfer  Transfers assist     Chair/bed transfer assist level: Moderate Assistance - Patient 50 - 74%     Locomotion Ambulation   Ambulation assist   Ambulation activity did not occur: Safety/medical concerns (fatigue, weakness, decreased balance/postural control)  Assist level: Moderate Assistance - Patient 50 - 74% Assistive device: Lite Gait Max distance: 160ft   Walk 10 feet activity   Assist  Walk 10 feet activity did not occur: Safety/medical concerns (fatigue, weakness, decreased balance/postural control)  Assist level: 2 helpers Assistive device: Other (comment) (L handrail and RUE around therapist)   Walk 50 feet activity   Assist Walk 50 feet with 2 turns activity did not occur: Safety/medical concerns (fatigue, weakness, decreased balance/postural control)         Walk 150 feet activity   Assist Walk 150 feet activity did not occur: Safety/medical concerns (fatigue, weakness, decreased balance/postural control)         Walk 10 feet on uneven surface  activity   Assist Walk 10 feet on uneven surfaces activity did not occur: Safety/medical concerns (fatigue, weakness,  decreased balance/postural control)         Wheelchair     Assist Is the patient using a wheelchair?: Yes Type of Wheelchair: Manual    Wheelchair assist level: Supervision/Verbal cueing Max wheelchair distance: 139ft    Wheelchair 50 feet with 2 turns activity    Assist        Assist Level: Supervision/Verbal cueing   Wheelchair 150 feet activity     Assist      Assist Level: Supervision/Verbal cueing   Blood pressure 126/63, pulse 81, temperature 98.1 F (36.7 C), resp. rate 16, height 4\' 6"  (1.372 m), weight 66.1 kg, SpO2 94 %.    Medical Problem List and Plan: 1.  Right side weakness secondary to left lateral thalamic infarct likely small vessel disease             -patient may  shower             -ELOS/Goals: 14-16d  Continue CIR  Appreciate SW updating daughter 2.  Impaired mobility: continue Lovenox             -antiplatelet therapy: Aspirin 81 mg daily and Plavix 75 mg day x3 weeks then aspirin alone 3. Right lower extremity pain: continue Tylenol as needed 4. Insomnia: continue Melatonin 10 mg nightly as needed sleep             -antipsychotic agents: N/A 5. Neuropsych: This patient is capable of making decisions on her own behalf. 6. Skin/Wound Care: Routine skin checks 7. Fluids/Electrolytes/Nutrition: Routine in and outs with follow-up chemistries 8.  Permissive hypertension.  Patient on losartan/HCTZ 100/25 daily prior to admission.  Restarted Losartan 12.5mg  daily- much better control. Monitor creatinine weekly.   9.  Hyperlipidemia.  LDL 198. Continue Lipitor 10.  CKD stage III.  Baseline creatinine 1.52.  Cr reviewed and current better than baseline, monitor as needed.  11. Tachycardia: resolved, d/c coreg 12. Obesity BMI 35.99: change diet to heart healthy/carb modified.  13. Right knee buckling: knee sleeve ordered 14. GERD without esophagitis: change protonix to PRN. 15. Constipation- 10/30- will give sorbitol 30 cc x1.     LOS: 10 days A FACE TO FACE EVALUATION WAS PERFORMED  11/30 Diane Webb 08/03/2021, 11:44 AM

## 2021-08-04 DIAGNOSIS — I63512 Cerebral infarction due to unspecified occlusion or stenosis of left middle cerebral artery: Secondary | ICD-10-CM | POA: Diagnosis not present

## 2021-08-04 NOTE — Progress Notes (Signed)
Occupational Therapy Session Note  Patient Details  Name: Diane Webb MRN: [id number] Date of Birth: August 12, 1927  Today's Date: 08/04/2021 OT Individual Time: 1407-1500 OT Individual Time Calculation (min): 53 min    Short Term Goals: Week 2:  OT Short Term Goal 1 (Week 2): Pt will complete toilet transfer with mod A OT Short Term Goal 2 (Week 2): Pt will complete functional squat pivot transfer for prep for ADLs with min A OT Short Term Goal 3 (Week 2): Pt will complete TTB transfer with mod A  Skilled Therapeutic Interventions/Progress Updates:  Skilled OT intervention completed with focus on toilet and shower transfers. Pt received seated in w/c with daughter present, agreeable to session. Requested to use toilet. Completed sit > stand then stand pivot using RW with min A to BSC. Increased time needed for void/BM, with pt reporting feeling constipated. Encouraged water intake throughout the day. Able to complete anterior pericare with supervision while seated, with pt able to complete posterior pericare with min A due to limited AROM for reaching behind. Reports having a bidet at home that cleans after BM. Sit > stand using RW with min A, and pt pushing up from both arm rest on BSC, able to maintain standing with CGA while therapist threaded brief. Pt attached brief while seated with supervision. Donned pants over hips in stance using RW, with CGA needed for balance only. Stand pivot > w/c using RW and min A. Pt's daughter presented pictures of pt's bathroom set up with shower seat with bilateral arm rests, as well as toilet/bidet with handles on both sides. Reported the shower depth is 26 inches. Discussed potential recommendations for shower transfer with current set up. Discussed maybe having pt complete showers in tub/shower in the home with TTB to minimize walking in wet shower without RW, however discussed trialing transfers in mock apartment. Completed shower transfer <> TTB with stand  pivot using RW and min A. Cues needed for positioning feet and RW in bathroom, with obstacles around. Pt able to lift both legs over threshold, cues needed for safety for seated position. Pt left seated in w/c, with daughter in room, chair alarm on and all needs in reach at end of session.  Therapy Documentation Precautions:  Precautions Precautions: Fall Precaution Comments: R hemiparesis; Pt's R knee has tendency to buckle during transfers Restrictions Weight Bearing Restrictions: No  Pain: No c/o pain   Therapy/Group: Individual Therapy  Senica Crall E Aline Wesche 08/04/2021, 7:55 AM

## 2021-08-04 NOTE — Progress Notes (Signed)
Occupational Therapy Session Note  Patient Details  Name: Diane Webb MRN: [id number] Date of Birth: [birth year]  Today's Date: 08/05/2021 OT Individual Time: 1030-1056 OT Individual Time Calculation (min): 26 min    Short Term Goals: Week 1:  OT Short Term Goal 1 (Week 1): Pt will perform sit <> stand with mod A in prep for ADLs OT Short Term Goal 1 - Progress (Week 1): Met OT Short Term Goal 2 (Week 1): Pt will complete LB dressing with min A OT Short Term Goal 2 - Progress (Week 1): Met OT Short Term Goal 3 (Week 1): Pt will complete toilet transfer with mod A OT Short Term Goal 3 - Progress (Week 1): Progressing toward goal  Skilled Therapeutic Interventions/Progress Updates:    Pt greeted in bed, some c/o nerve pain in the Rt LE however manageable with rest and seated vs standing activity. Setup for bed mobility with HOB raised using the bedrail and then CGA-Min A for short distance ambulatory transfer to the w/c parked in front of the sink. Pt encouraged to stand to work on standing endurance however preferred to sit while engaging in grooming tasks. Note that the sink is not very w/c accessible and therefore pt required Mod A for oral care and Min A for face washing. Setup for applying facial cream and brushing hair. Note pt did incorporate the Rt hand during bimanual activity but used the Lt hand at dominant level. Pt able to doff nightgown and don a new one given assistance to lower over hips in standing. Total A for Teds + footwear. CGA for stand pivot<bed using device and pt transitioned to supine without physical assistance. She remained in bed at close of session, all needs within reach and bed alarm set. Tx focus placed on ADL retraining, standing balance, and activity tolerance.   Therapy Documentation Precautions:  Precautions Precautions: Fall Precaution Comments: R hemiparesis; Pt's R knee has tendency to buckle during transfers Restrictions Weight Bearing Restrictions:  No  ADL: ADL Grooming: Minimal assistance Where Assessed-Grooming: Edge of bed Upper Body Bathing: Minimal assistance Where Assessed-Upper Body Bathing: Edge of bed Lower Body Bathing: Moderate assistance Where Assessed-Lower Body Bathing: Edge of bed Upper Body Dressing: Minimal assistance Where Assessed-Upper Body Dressing: Edge of bed Lower Body Dressing: Moderate assistance Where Assessed-Lower Body Dressing: Edge of bed Toileting: Maximal assistance Where Assessed-Toileting: Bedside Commode ADL Comments: Pt able to complete UB bathing/dressing with min A due to BUE limited AROM and R >L sided weakness and decreased cooridination, presents with R knee buckling during transfers using RW and able to complete LB bathing/dressing at mod A  Therapy/Group: Individual Therapy  Darrian Goodwill A Meryle Pugmire 08/05/2021, 12:37 PM

## 2021-08-04 NOTE — Progress Notes (Signed)
Occupational Therapy Session Note  Patient Details  Name: Diane Webb MRN: [id number] Date of Birth: 04-21-1927  Today's Date: 08/04/2021 OT Individual Time: 1696-7893 OT Individual Time Calculation (min): 73 min    Short Term Goals: Week 2:  OT Short Term Goal 1 (Week 2): Pt will complete toilet transfer with mod A OT Short Term Goal 2 (Week 2): Pt will complete functional squat pivot transfer for prep for ADLs with min A OT Short Term Goal 3 (Week 2): Pt will complete TTB transfer with mod A  Skilled Therapeutic Interventions/Progress Updates:  Skilled OT intervention completed with focus on ADL retraining and fine motor tasks. Pt received supine in bed with NT completing donning of clean brief. Pt agreeable to session. Completed bed mobility with supervision. At EOB completed UB bathing and dressing with supervision, LB bathing with supervision and LB dressing with min A at the sit > stand level using RW. TEDs donned with total A. Shoes donned at max A. Sit > stand with min A using RW and elevated bed, then stand pivot to w/c with min A for balance and cues of R foot positioning. While seated at sink, pt completed grooming tasks with set up A. Used R hand during fine motor ADL tasks unprompted. Sit > stand using RW at sink with Mod A, attempted completing combing hair in standing to promote upright posture for standing as pt presents with increased trunk flexion while standing, however pt unable due to limited AROM in both shoulders. Pt uses compensatory method of using one hand to assist the other to increase ROM for combing hair and reports best completion in seated position. Pt request to have sticky spots on skin removed with adhesive remover, with therapist completing for time. Participated fine motor task using theraputty at table top while seated in w/c with cues required for technique when using R hand. Pt reports she likes using the putty and often uses it in between sessions and feels  her R hand is improving each day. Educated on plan to grade up bathing task by completing TTB transfer next session as pt demonstrates good ability at EOB. Discussed home shower set up, with technique to transfer demonstrated in pt's room. Pt left seated in w/c with alarm belt on and all needs in reach at end of session.  Therapy Documentation Precautions:  Precautions Precautions: Fall Precaution Comments: R hemiparesis; Pt's R knee has tendency to buckle during transfers Restrictions Weight Bearing Restrictions: No  Pain: Unrated pain in R shoulder, stiffness, improved with rest   Therapy/Group: Individual Therapy  Wassim Kirksey E Kilah Drahos 08/04/2021, 7:50 AM

## 2021-08-04 NOTE — Progress Notes (Signed)
Physical Therapy Session Note  Patient Details  Name: Diane Webb MRN: [id number] Date of Birth: [birth year]  Today's Date: 08/04/2021 PT Individual Time: 1505-1530 PT Individual Time Calculation (min): 25 min   Short Term Goals: Week 1:  PT Short Term Goal 1 (Week 1): pt will transfer sit<>stand with LRAD and min A PT Short Term Goal 1 - Progress (Week 1): Progressing toward goal PT Short Term Goal 2 (Week 1): pt will transfer bed<>chair with LRAD and mod A consistantly PT Short Term Goal 2 - Progress (Week 1): Progressing toward goal PT Short Term Goal 3 (Week 1): pt will perform simulated car transfer with LRAD and mod A PT Short Term Goal 3 - Progress (Week 1): Progressing toward goal Week 2:  PT Short Term Goal 1 (Week 2): pt will transfer sit<>stand with LRAD and min A PT Short Term Goal 2 (Week 2): pt will transfer bed<>chair with LRAD and mod A consistantly PT Short Term Goal 3 (Week 2): pt will ambulate 86ft with LRAD and mod A of 1  Skilled Therapeutic Interventions/Progress Updates:    Pt seated in w/c on arrival and agreeable to therapy. No complaint of pain. Session focused on gait training with emphasis on distance for endurance and repetition movement pattern. Pt ambulated x 50 ft, x 44 ft, x 80 ft, 2 x 90 ft with RW and min A. DF assist wrap in place to prevent ankle inversion. Pt demoes step through pattern with R step longer than L, hunched posture, and decr stance time on R foot. Therapist provided gentle facilitation for incr WB on R side and VC for incr step length on L foot as pt became fatigued. Occ small knee buckle with no assist required to recover. Pt required mod A to stand throughout session, demoing very slow rise while powering up. Pt returned to room after session and returned to bed. Doffed L shoe mod I, mod A to doff R shoe. Sit>supine with supervision. Pt remained in bed after session and was left with all needs in reach and alarm active.   Therapy  Documentation Precautions:  Precautions Precautions: Fall Precaution Comments: R hemiparesis; Pt's R knee has tendency to buckle during transfers Restrictions Weight Bearing Restrictions: No    Therapy/Group: Individual Therapy  Juluis Rainier 08/04/2021, 3:39 PM

## 2021-08-04 NOTE — Progress Notes (Signed)
Physical Therapy Session Note  Patient Details  Name: Diane Webb MRN: [id number] Date of Birth: Jun 09, 1927  Today's Date: 08/04/2021 PT Individual Time: 3570-1779 PT Individual Time Calculation (min): 54 min   Short Term Goals: Week 1:  PT Short Term Goal 1 (Week 1): pt will transfer sit<>stand with LRAD and min A PT Short Term Goal 1 - Progress (Week 1): Progressing toward goal PT Short Term Goal 2 (Week 1): pt will transfer bed<>chair with LRAD and mod A consistantly PT Short Term Goal 2 - Progress (Week 1): Progressing toward goal PT Short Term Goal 3 (Week 1): pt will perform simulated car transfer with LRAD and mod A PT Short Term Goal 3 - Progress (Week 1): Progressing toward goal Week 2:  PT Short Term Goal 1 (Week 2): pt will transfer sit<>stand with LRAD and min A PT Short Term Goal 2 (Week 2): pt will transfer bed<>chair with LRAD and mod A consistantly PT Short Term Goal 3 (Week 2): pt will ambulate 90ft with LRAD and mod A of 1 Week 3:     Skilled Therapeutic Interventions/Progress Updates:     Pt received oob in wc. Pt denies pain. Therapist  Donned R LE ankle DF assist ACE wrap.  Gait training 53ft using RW (including 1x 180degree turn) with heavy min assist for safety - continues to have excessive hip flexion causing forward trunk lean  requiring cuing and facilitation for increased hip extensor activation to improve upright posture - excessive R LE adduction with slight internal rotation during swing phase - demos mild wobbles R knee but no instability during stance.    Standing w/RW worked on tapping RLE to R edge of step to promote increased step length/control/decreased add tendency w/gait.  Repeates x 20w/frequent cues to maximize abdustion.  Standing RUE coordination +postural control using RUE to attatch clothespins to box placed to R, verbal and tactile cues to promote ttrunk/hip extension w/task. Maintains balance w/RW w/cga during task, challenging from RUE  coordination standpoint esp when managing black/heavy tension clothespins.  Gait 10ft w/RW, min assist, decreased adduction of RLE/improved placement at initial contact, pt fatigued w/this distance and stated RLE felt like it might "give out" when turning to sit.  Noted flexion/IR of RLE w/stand to sit likely d/t fatigue.  Seated RUEcoordination activity of placing then removing 9 pegs in 9hole peg box.  DF assist wrap removed.  Pt left oob in wc w/alarm belt set and needs in reach     Therapy Documentation Precautions:  Precautions Precautions: Fall Precaution Comments: R hemiparesis; Pt's R knee has tendency to buckle during transfers Restrictions Weight Bearing Restrictions: No   Therapy/Group: Individual Therapy  Shearon Balo 08/04/2021, 12:05 PM

## 2021-08-04 NOTE — Progress Notes (Signed)
PROGRESS NOTE   Subjective/Complaints: No new complaints this morning Commended her on her performance with PT and she is pleased No issues overnight  ROS:  Pt denies SOB, abd pain, CP, N/V/C/D, and vision changes  Objective:   No results found. No results for input(s): WBC, HGB, HCT, PLT in the last 72 hours.  No results for input(s): NA, K, CL, CO2, GLUCOSE, BUN, CREATININE, CALCIUM in the last 72 hours.   Intake/Output Summary (Last 24 hours) at 08/04/2021 0854 Last data filed at 08/04/2021 0730 Gross per 24 hour  Intake 414 ml  Output --  Net 414 ml        Physical Exam: Vital Signs Blood pressure 131/67, pulse 72, temperature 98.2 F (36.8 C), resp. rate 16, height 4\' 6"  (1.372 m), weight 66.1 kg, SpO2 96 %. Gen: no distress, normal appearing HEENT: oral mucosa pink and moist, NCAT Cardio: Reg rate Chest: normal effort, normal rate of breathing Abd: soft, non-distended Ext: no edema Psych: pleasant, normal affect  Skin: intact Neurologic: Cranial nerves II through XII intact, motor strength is 5/5 in left and 4/5 RIght  deltoid, bicep, tricep, grip, hip flexor, knee extensors, ankle dorsiflexor and plantar flexor Sensory exam normal sensation to light touch and proprioception in left upper and lower extremities, absent on right side  Sensory ataxia  Right finger nose finger and RIght heel shin Musculoskeletal: Full range of motion in all 4 extremities. No joint swelling   Assessment/Plan: 1. Functional deficits which require 3+ hours per day of interdisciplinary therapy in a comprehensive inpatient rehab setting. Physiatrist is providing close team supervision and 24 hour management of active medical problems listed below. Physiatrist and rehab team continue to assess barriers to discharge/monitor patient progress toward functional and medical goals  Care Tool:  Bathing    Body parts bathed by  patient: Right arm, Left arm, Chest, Front perineal area, Abdomen, Right upper leg, Left upper leg, Face   Body parts bathed by helper: Buttocks, Right lower leg, Left lower leg     Bathing assist Assist Level: Supervision/Verbal cueing     Upper Body Dressing/Undressing Upper body dressing   What is the patient wearing?: Pull over shirt    Upper body assist Assist Level: Set up assist    Lower Body Dressing/Undressing Lower body dressing      What is the patient wearing?: Incontinence brief, Pants     Lower body assist Assist for lower body dressing: Minimal Assistance - Patient > 75%     Toileting Toileting    Toileting assist Assist for toileting: Moderate Assistance - Patient 50 - 74%     Transfers Chair/bed transfer  Transfers assist     Chair/bed transfer assist level: Moderate Assistance - Patient 50 - 74% (stand pivot) Chair/bed transfer assistive device:   Ambulation assist   Ambulation activity did not occur: Safety/medical concerns (fatigue, weakness, decreased balance/postural control)  Assist level: Minimal Assistance - Patient > 75% Assistive device: Walker-rolling Max distance: 35ft   Walk 10 feet activity   Assist  Walk 10 feet activity did not occur: Safety/medical concerns (fatigue, weakness, decreased balance/postural control)  Assist level:  2 helpers Assistive device: Other (comment) (L handrail and RUE around therapist)   Walk 50 feet activity   Assist Walk 50 feet with 2 turns activity did not occur: Safety/medical concerns (fatigue, weakness, decreased balance/postural control)         Walk 150 feet activity   Assist Walk 150 feet activity did not occur: Safety/medical concerns (fatigue, weakness, decreased balance/postural control)         Walk 10 feet on uneven surface  activity   Assist Walk 10 feet on uneven surfaces activity did not occur: Safety/medical concerns (fatigue,  weakness, decreased balance/postural control)         Wheelchair     Assist Is the patient using a wheelchair?: Yes Type of Wheelchair: Manual    Wheelchair assist level: Supervision/Verbal cueing Max wheelchair distance: 132ft    Wheelchair 50 feet with 2 turns activity    Assist        Assist Level: Supervision/Verbal cueing   Wheelchair 150 feet activity     Assist      Assist Level: Supervision/Verbal cueing   Blood pressure 131/67, pulse 72, temperature 98.2 F (36.8 C), resp. rate 16, height 4\' 6"  (1.372 m), weight 66.1 kg, SpO2 96 %.    Medical Problem List and Plan: 1.  Right side weakness secondary to left lateral thalamic infarct likely small vessel disease             -patient may  shower             -ELOS/Goals: 14-16d  Continue CIR  Appreciate SW updating daughter 2.  Impaired mobility: continue Lovenox             -antiplatelet therapy: Aspirin 81 mg daily and Plavix 75 mg day x3 weeks then aspirin alone 3. Right lower extremity pain: continue Tylenol as needed 4. Insomnia: continue Melatonin 10 mg nightly as needed sleep             -antipsychotic agents: N/A 5. Neuropsych: This patient is capable of making decisions on her own behalf. 6. Skin/Wound Care: Routine skin checks 7. Fluids/Electrolytes/Nutrition: Routine in and outs with follow-up chemistries 8.  Permissive hypertension.  Patient on losartan/HCTZ 100/25 daily prior to admission.  Restarted Losartan 12.5mg  daily- much better control, continue. Monitor creatinine weekly.   9.  Hyperlipidemia.  LDL 198. Continue Lipitor 10.  CKD stage III.  Baseline creatinine 1.52.  Cr reviewed and current better than baseline, monitor as needed.  11. Tachycardia: resolved, d/c coreg 12. Obesity BMI 35.99: change diet to heart healthy/carb modified.  13. Right knee buckling: knee sleeve ordered 14. GERD without esophagitis: change protonix to PRN. 15. Constipation- 10/30- will give  sorbitol 30 cc x1.    LOS: 11 days A FACE TO FACE EVALUATION WAS PERFORMED  11/30 Diane Webb 08/04/2021, 8:54 AM

## 2021-08-05 DIAGNOSIS — I63512 Cerebral infarction due to unspecified occlusion or stenosis of left middle cerebral artery: Secondary | ICD-10-CM | POA: Diagnosis not present

## 2021-08-05 NOTE — Progress Notes (Signed)
PROGRESS NOTE   Subjective/Complaints:  No issues overnite , had a good day in therapy   ROS:  Pt denies SOB, abd pain, CP, N/V/C/D, and vision changes  Objective:   No results found. No results for input(s): WBC, HGB, HCT, PLT in the last 72 hours.  No results for input(s): NA, K, CL, CO2, GLUCOSE, BUN, CREATININE, CALCIUM in the last 72 hours.   Intake/Output Summary (Last 24 hours) at 08/05/2021 0718 Last data filed at 08/04/2021 1849 Gross per 24 hour  Intake 774 ml  Output --  Net 774 ml         Physical Exam: Vital Signs Blood pressure 122/63, pulse 79, temperature 98.2 F (36.8 C), temperature source Oral, resp. rate 18, height 4\' 6"  (1.372 m), weight 66.1 kg, SpO2 92 %.  General: No acute distress Mood and affect are appropriate Heart: Regular rate and rhythm no rubs murmurs or extra sounds Lungs: Clear to auscultation, breathing unlabored, no rales or wheezes Abdomen: Positive bowel sounds, soft nontender to palpation, nondistended Extremities: No clubbing, cyanosis, or edema Skin: No evidence of breakdown, no evidence of rash  Neurologic: Cranial nerves II through XII intact, motor strength is 5/5 in left and 4/5 RIght  deltoid, bicep, tricep, grip, hip flexor, knee extensors, ankle dorsiflexor and plantar flexor Sensory exam normal sensation to light touch and proprioception in left upper and lower extremities, absent on right side  Sensory ataxia  Right finger nose finger and RIght heel shin Musculoskeletal: Full range of motion in all 4 extremities. No joint swelling Oriented x 3   Assessment/Plan: 1. Functional deficits which require 3+ hours per day of interdisciplinary therapy in a comprehensive inpatient rehab setting. Physiatrist is providing close team supervision and 24 hour management of active medical problems listed below. Physiatrist and rehab team continue to assess barriers to  discharge/monitor patient progress toward functional and medical goals  Care Tool:  Bathing    Body parts bathed by patient: Right arm, Left arm, Chest, Abdomen, Right upper leg, Left upper leg, Face, Left lower leg, Right lower leg   Body parts bathed by helper: Buttocks, Right lower leg, Left lower leg     Bathing assist Assist Level: Supervision/Verbal cueing     Upper Body Dressing/Undressing Upper body dressing   What is the patient wearing?: Pull over shirt    Upper body assist Assist Level: Supervision/Verbal cueing    Lower Body Dressing/Undressing Lower body dressing      What is the patient wearing?: Incontinence brief, Pants     Lower body assist Assist for lower body dressing: Minimal Assistance - Patient > 75%     Toileting Toileting    Toileting assist Assist for toileting: Moderate Assistance - Patient 50 - 74%     Transfers Chair/bed transfer  Transfers assist     Chair/bed transfer assist level: Moderate Assistance - Patient 50 - 74% Chair/bed transfer assistive device:   Ambulation assist   Ambulation activity did not occur: Safety/medical concerns (fatigue, weakness, decreased balance/postural control)  Assist level: Minimal Assistance - Patient > 75% Assistive device: Walker-rolling Max distance: 90 ft   Walk 10 feet  activity   Assist  Walk 10 feet activity did not occur: Safety/medical concerns (fatigue, weakness, decreased balance/postural control)  Assist level: Minimal Assistance - Patient > 75% Assistive device: Walker-rolling   Walk 50 feet activity   Assist Walk 50 feet with 2 turns activity did not occur: Safety/medical concerns (fatigue, weakness, decreased balance/postural control)  Assist level: Minimal Assistance - Patient > 75% Assistive device: Walker-rolling    Walk 150 feet activity   Assist Walk 150 feet activity did not occur: Safety/medical concerns (fatigue, weakness,  decreased balance/postural control)         Walk 10 feet on uneven surface  activity   Assist Walk 10 feet on uneven surfaces activity did not occur: Safety/medical concerns (fatigue, weakness, decreased balance/postural control)         Wheelchair     Assist Is the patient using a wheelchair?: Yes Type of Wheelchair: Manual    Wheelchair assist level: Supervision/Verbal cueing Max wheelchair distance: 161ft    Wheelchair 50 feet with 2 turns activity    Assist        Assist Level: Supervision/Verbal cueing   Wheelchair 150 feet activity     Assist      Assist Level: Supervision/Verbal cueing   Blood pressure 122/63, pulse 79, temperature 98.2 F (36.8 C), temperature source Oral, resp. rate 18, height 4\' 6"  (1.372 m), weight 66.1 kg, SpO2 92 %.    Medical Problem List and Plan: 1.  Right side weakness secondary to left lateral thalamic infarct likely small vessel disease             -patient may  shower             -ELOS/Goals: 14-16d  Continue CIR  Appreciate SW updating daughter 2.  Impaired mobility: continue Lovenox             -antiplatelet therapy: Aspirin 81 mg daily and Plavix 75 mg day x3 weeks then aspirin alone 3. Right lower extremity pain: continue Tylenol as needed 4. Insomnia: continue Melatonin 10 mg nightly as needed sleep             -antipsychotic agents: N/A 5. Neuropsych: This patient is capable of making decisions on her own behalf. 6. Skin/Wound Care: Routine skin checks 7. Fluids/Electrolytes/Nutrition: Routine in and outs with follow-up chemistries 8.  Permissive hypertension.  Patient on losartan/HCTZ 100/25 daily prior to admission.  Restarted Losartan 12.5mg  daily- much better control, continue. Monitor creatinine weekly.   Vitals:   08/04/21 1922 08/05/21 0408  BP: (!) 146/65 122/63  Pulse: 90 79  Resp: 18 18  Temp: 97.8 F (36.6 C) 98.2 F (36.8 C)  SpO2: 94% 92%  ' 9.  Hyperlipidemia.  LDL 198.  Continue Lipitor 10.  CKD stage III.  Baseline creatinine 1.52.  Cr reviewed and current better than baseline, monitor as needed.  11. Tachycardia: resolved, d/c coreg 12. Obesity BMI 35.99: change diet to heart healthy/carb modified.  13. Right knee buckling: knee sleeve ordered 14. GERD without esophagitis: change protonix to PRN. 15. Constipation- 10/30- will give sorbitol 30 cc x1.    LOS: 12 days A FACE TO FACE EVALUATION WAS PERFORMED  11/30 08/05/2021, 7:18 AM

## 2021-08-06 NOTE — Progress Notes (Signed)
Occupational Therapy Session Note  Patient Details  Name: Diane Webb MRN: [id number] Date of Birth: 12/22/1926  Today's Date: 08/06/2021 OT Individual Time: 1400-1440 OT Individual Time Calculation (min): 40 min    Short Term Goals: Week 2:  OT Short Term Goal 1 (Week 2): Pt will complete toilet transfer with mod A OT Short Term Goal 2 (Week 2): Pt will complete functional squat pivot transfer for prep for ADLs with min A OT Short Term Goal 3 (Week 2): Pt will complete TTB transfer with mod A  Skilled Therapeutic Interventions/Progress Updates:    Patient in bed, alert, states that she feels a little stiff from limited therapy this weekend.  She denies pain and is happy to participate.  Supine to sitting edge of bed with CS.  Sit to stand and stand pivot transfer with RW min A, facilitation of right foot and knee.  Completed standing stepping onto 3" surface, each leg and alternating legs with min facilitation of right - good tolerance.  Completed trunk mobility activities, reaching and right hand dexterity activities with moderate difficulty.   Stand pivot transfer to/from commode with min A and increased time, cues/facilitation of right.  Dependent for pants down, set up for hygiene and max A for pants up.  She remained seated in w/c at close of session, seat belt alarm set and call bell in hand.    Therapy Documentation Precautions:  Precautions Precautions: Fall Precaution Comments: R hemiparesis; Pt's R knee has tendency to buckle during transfers Restrictions Weight Bearing Restrictions: No   Therapy/Group: Individual Therapy  Barrie Lyme 08/06/2021, 7:39 AM

## 2021-08-07 DIAGNOSIS — I63512 Cerebral infarction due to unspecified occlusion or stenosis of left middle cerebral artery: Secondary | ICD-10-CM | POA: Diagnosis not present

## 2021-08-07 LAB — GLUCOSE, CAPILLARY: Glucose-Capillary: 100 mg/dL — ABNORMAL HIGH (ref 70–99)

## 2021-08-07 NOTE — Progress Notes (Signed)
Occupational Therapy Session Note  Patient Details  Name: Zen Felling MRN: [id number] Date of Birth: 1927/09/03  Today's Date: 08/07/2021 OT Individual Time: 1500-1533 OT Individual Time Calculation (min): 33 min    Short Term Goals: Week 2:  OT Short Term Goal 1 (Week 2): Pt will complete toilet transfer with mod A OT Short Term Goal 2 (Week 2): Pt will complete functional squat pivot transfer for prep for ADLs with min A OT Short Term Goal 3 (Week 2): Pt will complete TTB transfer with mod A  Skilled Therapeutic Interventions/Progress Updates:  Skilled OT intervention completed with focus on BUE endurance and toilet transfers. Pt received seated in chair, reporting increased fatigue, but agreeable to session. Transported to gym in w/c with total A for time, sit > stand using RW with min A then stand pivot with min A to nustep. Required min A and cues for RLE management when positioning onto nustep. Completed 3.5 mins on level 1, with rest break required, followed by 3 mins on level 2 to promote overall endurance. Transported back to room, with pt requesting assist to toilet. Sit > stand then ambulated using RW with min A to BSC over toilet. Able to complete front pericare with set up A. Cues needed in bathroom tight spaces how to position RW for safety. Pt requested to get in bed at end of session. Required supervision A for EOB to supine bed mobility. Pt left semi-supine in bed in a position of comfort, with bed alarm on and all needs in reach on pt's intact side at end of session.  Therapy Documentation Precautions:  Precautions Precautions: Fall Precaution Comments: R hemiparesis; Pt's R knee has tendency to buckle during transfers Restrictions Weight Bearing Restrictions: No  Pain: No c/o pain   Therapy/Group: Individual Therapy  Claudell Wohler E Merrissa Giacobbe 08/07/2021, 7:44 AM

## 2021-08-07 NOTE — Progress Notes (Signed)
Occupational Therapy Session Note  Patient Details  Name: Diane Webb MRN: [id number] Date of Birth: [birth year]  Today's Date: 08/07/2021 OT Individual Time: 1700-1749 OT Individual Time Calculation (min): 69 min    Short Term Goals: Week 2:  OT Short Term Goal 1 (Week 2): Pt will complete toilet transfer with mod A OT Short Term Goal 2 (Week 2): Pt will complete functional squat pivot transfer for prep for ADLs with min A OT Short Term Goal 3 (Week 2): Pt will complete TTB transfer with mod A  Skilled Therapeutic Interventions/Progress Updates:  Skilled OT intervention completed with focus on functional ambulation/transfers for activity tolerance, and ADL retraining. Pt received seated in w/c, agreeable to session. Transported with total A in w/c to therapy gym. Completed mass practice of donning/doffing both shoes using reacher/long handled shoe horn. Pt demonstrating increased difficulty on RLE, however used R hand functionally during task, and able to progress assist from mod A to donn RLE to set up A. Pt able to recall 2 of 3 prep to stand tasks accurately. Sit > stand using RW with min A, then ambulated with min A about 30 feet, while managing BLEs and RW over flat ankle weights to simulate prep for stepping over shower threshold due to pt's shower set up. Pt required min A for balance, and mod cueing for positioning RW and BLEs while stepping over for safety. At end of 30 ft, pt completed 5 toe taps with RLE on small cone with mod A needed for balance using RW. Completed 2 more rounds, with rest breaks needed intermittently. While seated completed 10 toe taps on cone x2 on BLEs for hip flexor and core activation. Discussed pt's bathroom set up and purpose of the activities to prep for shower transfer in pt's bathroom. Pt request to toilet, stand pivot using RW with min A to BSC, Max A for doffing LB clothing for time, completed front pericare with set up. Min A needed for clothing management  in stance using RW. Pt left seated in w/c, with belt alarm on and all needs in reach at end of session.  Therapy Documentation Precautions:  Precautions Precautions: Fall Precaution Comments: R hemiparesis; Pt's R knee has tendency to buckle during transfers Restrictions Weight Bearing Restrictions: No  Pain: No c/o pain during session   Therapy/Group: Individual Therapy  Chimere Klingensmith E Marrian Bells 08/07/2021, 7:43 AM

## 2021-08-07 NOTE — Progress Notes (Signed)
PROGRESS NOTE   Subjective/Complaints: No new complaints this morning Has some achiness No labs drawn this AM Diastolic BP soft- d/c losartan  ROS:  Pt denies SOB, abd pain, CP, N/V/C/D, and vision changes  Objective:   No results found. No results for input(s): WBC, HGB, HCT, PLT in the last 72 hours.  No results for input(s): NA, K, CL, CO2, GLUCOSE, BUN, CREATININE, CALCIUM in the last 72 hours.   Intake/Output Summary (Last 24 hours) at 08/07/2021 1111 Last data filed at 08/07/2021 0725 Gross per 24 hour  Intake 613 ml  Output --  Net 613 ml        Physical Exam: Vital Signs Blood pressure (!) 122/55, pulse 80, temperature 98.2 F (36.8 C), temperature source Oral, resp. rate 18, height 4\' 6"  (1.372 m), weight 66.1 kg, SpO2 98 %. Gen: no distress, normal appearing HEENT: oral mucosa pink and moist, NCAT Cardio: Reg rate Chest: normal effort, normal rate of breathing Abd: soft, non-distended Ext: no edema Psych: pleasant, normal affect Skin: intact Neurologic: Cranial nerves II through XII intact, motor strength is 5/5 in left and 4/5 RIght  deltoid, bicep, tricep, grip, hip flexor, knee extensors, ankle dorsiflexor and plantar flexor Sensory exam normal sensation to light touch and proprioception in left upper and lower extremities, absent on right side  Sensory ataxia  Right finger nose finger and RIght heel shin Musculoskeletal: Full range of motion in all 4 extremities. No joint swelling   Assessment/Plan: 1. Functional deficits which require 3+ hours per day of interdisciplinary therapy in a comprehensive inpatient rehab setting. Physiatrist is providing close team supervision and 24 hour management of active medical problems listed below. Physiatrist and rehab team continue to assess barriers to discharge/monitor patient progress toward functional and medical goals  Care Tool:  Bathing    Body  parts bathed by patient: Right arm, Left arm, Chest, Abdomen, Right upper leg, Left upper leg, Face, Left lower leg, Right lower leg   Body parts bathed by helper: Buttocks, Right lower leg, Left lower leg     Bathing assist Assist Level: Supervision/Verbal cueing     Upper Body Dressing/Undressing Upper body dressing   What is the patient wearing?: Dress    Upper body assist Assist Level: Minimal Assistance - Patient > 75%    Lower Body Dressing/Undressing Lower body dressing      What is the patient wearing?: Incontinence brief, Pants     Lower body assist Assist for lower body dressing: Minimal Assistance - Patient > 75%     Toileting Toileting    Toileting assist Assist for toileting: Moderate Assistance - Patient 50 - 74%     Transfers Chair/bed transfer  Transfers assist     Chair/bed transfer assist level: Moderate Assistance - Patient 50 - 74% Chair/bed transfer assistive device:   Ambulation assist   Ambulation activity did not occur: Safety/medical concerns (fatigue, weakness, decreased balance/postural control)  Assist level: Minimal Assistance - Patient > 75% Assistive device: Walker-rolling Max distance: 90 ft   Walk 10 feet activity   Assist  Walk 10 feet activity did not occur: Safety/medical concerns (fatigue, weakness, decreased  balance/postural control)  Assist level: Minimal Assistance - Patient > 75% Assistive device: Walker-rolling   Walk 50 feet activity   Assist Walk 50 feet with 2 turns activity did not occur: Safety/medical concerns (fatigue, weakness, decreased balance/postural control)  Assist level: Minimal Assistance - Patient > 75% Assistive device: Walker-rolling    Walk 150 feet activity   Assist Walk 150 feet activity did not occur: Safety/medical concerns (fatigue, weakness, decreased balance/postural control)         Walk 10 feet on uneven surface  activity   Assist Walk  10 feet on uneven surfaces activity did not occur: Safety/medical concerns (fatigue, weakness, decreased balance/postural control)         Wheelchair     Assist Is the patient using a wheelchair?: Yes Type of Wheelchair: Manual    Wheelchair assist level: Supervision/Verbal cueing Max wheelchair distance: 181ft    Wheelchair 50 feet with 2 turns activity    Assist        Assist Level: Supervision/Verbal cueing   Wheelchair 150 feet activity     Assist      Assist Level: Supervision/Verbal cueing   Blood pressure (!) 122/55, pulse 80, temperature 98.2 F (36.8 C), temperature source Oral, resp. rate 18, height 4\' 6"  (1.372 m), weight 66.1 kg, SpO2 98 %.    Medical Problem List and Plan: 1.  Right side weakness secondary to left lateral thalamic infarct likely small vessel disease             -patient may  shower             -ELOS/Goals: 14-16d  Continue CIR  Appreciate SW updating daughter 2.  Impaired mobility: continue Lovenox             -antiplatelet therapy: Aspirin 81 mg daily and Plavix 75 mg day x3 weeks then aspirin alone 3. Right lower extremity pain: continue Tylenol as needed 4. Insomnia: continue Melatonin 10 mg nightly as needed sleep             -antipsychotic agents: N/A 5. Neuropsych: This patient is capable of making decisions on her own behalf. 6. Skin/Wound Care: Routine skin checks 7. Fluids/Electrolytes/Nutrition: Routine in and outs with follow-up chemistries 8.  Permissive hypertension.  Patient on losartan/HCTZ 100/25 daily prior to admission.  d/c losartan given soft DBP 9.  Hyperlipidemia.  LDL 198. Continue Lipitor 10.  CKD stage III.  Baseline creatinine 1.52.  Cr reviewed and current better than baseline, monitor as needed.  11. Tachycardia: resolved, d/c coreg 12. Obesity BMI 35.99: change diet to heart healthy/carb modified.  13. Right knee buckling: knee sleeve ordered 14. GERD without esophagitis: change protonix  to PRN. 15. Constipation- 10/30- will give sorbitol 30 cc x1.    LOS: 14 days A FACE TO FACE EVALUATION WAS PERFORMED  Diane Webb P Reginald Weida 08/07/2021, 11:11 AM

## 2021-08-07 NOTE — Progress Notes (Signed)
Physical Therapy Session Note  Patient Details  Name: Diane Webb MRN: [id number] Date of Birth: Oct 24, 1926  Today's Date: 08/07/2021 PT Individual Time: 4098-1191 AND 1400-1455 PT Individual Time Calculation (min): 45 min AND 55 min  Short Term Goals: Week 1:  PT Short Term Goal 1 (Week 1): pt will transfer sit<>stand with LRAD and min A PT Short Term Goal 1 - Progress (Week 1): Progressing toward goal PT Short Term Goal 2 (Week 1): pt will transfer bed<>chair with LRAD and mod A consistantly PT Short Term Goal 2 - Progress (Week 1): Progressing toward goal PT Short Term Goal 3 (Week 1): pt will perform simulated car transfer with LRAD and mod A PT Short Term Goal 3 - Progress (Week 1): Progressing toward goal Week 2:  PT Short Term Goal 1 (Week 2): pt will transfer sit<>stand with LRAD and min A PT Short Term Goal 2 (Week 2): pt will transfer bed<>chair with LRAD and mod A consistantly PT Short Term Goal 3 (Week 2): pt will ambulate 67ft with LRAD and mod A of 1 Week 3:     Skilled Therapeutic Interventions/Progress Updates:    AM SESSION Pt initially denies pain, at end of session states she feels "tingling/burning sesation RLE" but states "I don't call that pain".  Educated to monitor and if becomes uncomfortable or intolerable to notify MD as there are meds that might be available to her if these sxs worsen in future.  PT initially supine and agreeable to session w/focus on AM ADLs.  Supine to sit w/heob elevated and supervision using rail.   Therapist dons nonskid socks. Sit to stand w/mod assist and gait 9ft x 2 w/min assit to commode, commode transfer w/RW, BSC over commode,  min assist.  Pt continent of urine.  Performs perineal hygiene and bathing w/set up assist in sitting.  Sitting on edge of bed pt performs upper body bathing w/assist for back only, applies lotions, facial care, deodorant all w/set up assit.  Maintains balance well during activity without assist  needed. Dons shirt w/set up and additional time.  Dons pants in sitting to upper thighs w/mod assist, Sit to stand w/mod assit to rw and completes raising pants w/mod assist for R side, balances w/rw w/min assist and cues for upright posture.    stand pivot transfer to wc w/RW w/min assist.  From wc level pt performs oral care and hair care w/set up assist.  Therapist dons Ted hose total assist.    Pt left oob in wc w/alarm belt set and needs in reach  PM SESSION Pain - denies pain Pt initially oob in wc and agreeable to session. Functional gait: 35ft weaving around cones to simulate home environment w/min assist, cues for posture, IR of R hip occurs w/fatigue, cues to attend to foot placement R.  Seated resisted hip abd 2x10 w/3 sec hold Standing at hallway rail sidestepping 3 steps L/R w/cga and cues for RLE placement x 4 reps each direction, repeatedx 2 rounds w/seated rest HR 108, 02 sats 95%, HR decreases to 96 w/several min rest.  Seated RLE LAQs w/3lb resistance 3x10  Gait 8ft hallway to wc, turn/sit to wc w/overall min assist, increased R ankle supination tendency and decreased RLE clearance occurs w/fatigue.  Seated 9 peg task repeated x 2 w/Rhand for coordination training.    Standing w/walker, crossbody reaching transferring weighted horseshoes from low surface on R to tabletop on L using R hand , then reversing using L hand w/cga,  alternating single UE support on RW.  At end of session, pt transported to room.  Pt left oob in wc w/alarm belt set and needs in reach      Therapy Documentation Precautions:  Precautions Precautions: Fall Precaution Comments: R hemiparesis; Pt's R knee has tendency to buckle during transfers Restrictions Weight Bearing Restrictions: No   Therapy/Group: Individual Therapy  Shearon Balo 08/07/2021, 12:13 PM

## 2021-08-07 NOTE — Progress Notes (Signed)
Occupational Therapy Weekly Progress Note  Patient Details  Name: Diane Webb MRN: [id number] Date of Birth: 09/21/1927  Beginning of progress report period: August 01, 2021 End of progress report period: August 08, 2021   Patient has met 3 of 3 short term goals. Pt is making steady progress towards LTGs. Pt progressed from Max A with RW needed for sit > stands and stand pivot transfers to now Min A using RW. Pt has improved her awareness in RUE, and uses it functionally during self-care tasks unprompted. Pt continues demonstrate decreased strength and balance which impacts her ability to transfer at [facility] at this time, however is improving. She is now able to complete [facility] transfers with min A and toileting management with min-mod vs using stedy or bed pan. Pt has transferred to TTB using stand pivot transfer with min A and RW, and is eager to take a full shower to further progress in her functional abilities.  Patient continues to demonstrate the following deficits: muscle weakness and muscle joint tightness, decreased cardiorespiratoy endurance, and impaired timing and sequencing, motor apraxia, decreased coordination, and decreased motor planning and therefore will continue to benefit from skilled OT intervention to enhance overall performance with BADL and Reduce care partner burden.  Patient progressing toward long term goals..  Continue plan of care.  OT Short Term Goals Week 2:  OT Short Term Goal 1 (Week 2): Pt will complete toilet transfer with mod A OT Short Term Goal 1 - Progress (Week 2): Met OT Short Term Goal 2 (Week 2): Pt will complete functional squat pivot transfer for prep for ADLs with min A OT Short Term Goal 2 - Progress (Week 2): Met OT Short Term Goal 3 (Week 2): Pt will complete TTB transfer with mod A OT Short Term Goal 3 - Progress (Week 2): Met Week 3:  OT Short Term Goal 1 (Week 3): STG = LTG due to [facility] E [name] 08/07/2021, 4:16 PM

## 2021-08-08 DIAGNOSIS — I63512 Cerebral infarction due to unspecified occlusion or stenosis of left middle cerebral artery: Secondary | ICD-10-CM | POA: Diagnosis not present

## 2021-08-08 NOTE — Progress Notes (Signed)
PROGRESS NOTE   Subjective/Complaints: No new complaints this morning Right ankle brace ordered  ROS:  Pt denies SOB, abd pain, CP, N/V/C/D, and vision changes  Objective:   No results found. No results for input(s): WBC, HGB, HCT, PLT in the last 72 hours.  No results for input(s): NA, K, CL, CO2, GLUCOSE, BUN, CREATININE, CALCIUM in the last 72 hours.   Intake/Output Summary (Last 24 hours) at 08/08/2021 1251 Last data filed at 08/08/2021 0817 Gross per 24 hour  Intake 560 ml  Output --  Net 560 ml        Physical Exam: Vital Signs Blood pressure 129/68, pulse 81, temperature 98.4 F (36.9 C), resp. rate 16, height 4\' 6"  (1.372 m), weight 66.1 kg, SpO2 95 %. Gen: no distress, normal appearing HEENT: oral mucosa pink and moist, NCAT Cardio: Reg rate Chest: normal effort, normal rate of breathing Abd: soft, non-distended Ext: no edema Psych: pleasant, normal affect Skin: intact Neurologic: Cranial nerves II through XII intact, motor strength is 5/5 in left and 4/5 RIght  deltoid, bicep, tricep, grip, hip flexor, knee extensors, ankle dorsiflexor and plantar flexor Sensory exam normal sensation to light touch and proprioception in left upper and lower extremities, absent on right side  Sensory ataxia  Right finger nose finger and RIght heel shin Musculoskeletal: Full range of motion in all 4 extremities. No joint swelling   Assessment/Plan: 1. Functional deficits which require 3+ hours per day of interdisciplinary therapy in a comprehensive inpatient rehab setting. Physiatrist is providing close team supervision and 24 hour management of active medical problems listed below. Physiatrist and rehab team continue to assess barriers to discharge/monitor patient progress toward functional and medical goals  Care Tool:  Bathing    Body parts bathed by patient: Right arm, Left arm, Chest, Abdomen, Right upper leg,  Left upper leg, Face, Left lower leg, Right lower leg   Body parts bathed by helper: Buttocks, Right lower leg, Left lower leg     Bathing assist Assist Level: Supervision/Verbal cueing     Upper Body Dressing/Undressing Upper body dressing   What is the patient wearing?: Dress    Upper body assist Assist Level: Minimal Assistance - Patient > 75%    Lower Body Dressing/Undressing Lower body dressing      What is the patient wearing?: Incontinence brief, Pants     Lower body assist Assist for lower body dressing: Minimal Assistance - Patient > 75%     Toileting Toileting    Toileting assist Assist for toileting: Moderate Assistance - Patient 50 - 74%     Transfers Chair/bed transfer  Transfers assist     Chair/bed transfer assist level: Minimal Assistance - Patient > 75% Chair/bed transfer assistive device:   Ambulation assist   Ambulation activity did not occur: Safety/medical concerns (fatigue, weakness, decreased balance/postural control)  Assist level: Minimal Assistance - Patient > 75% Assistive device: Walker-rolling Max distance: 11ft   Walk 10 feet activity   Assist  Walk 10 feet activity did not occur: Safety/medical concerns (fatigue, weakness, decreased balance/postural control)  Assist level: Minimal Assistance - Patient > 75% Assistive device: Walker-rolling  Walk 50 feet activity   Assist Walk 50 feet with 2 turns activity did not occur: Safety/medical concerns (fatigue, weakness, decreased balance/postural control)  Assist level: Minimal Assistance - Patient > 75% Assistive device: Walker-rolling    Walk 150 feet activity   Assist Walk 150 feet activity did not occur: Safety/medical concerns (fatigue, weakness, decreased balance/postural control)         Walk 10 feet on uneven surface  activity   Assist Walk 10 feet on uneven surfaces activity did not occur: Safety/medical concerns  (fatigue, weakness, decreased balance/postural control)         Wheelchair     Assist Is the patient using a wheelchair?: Yes Type of Wheelchair: Manual    Wheelchair assist level: Supervision/Verbal cueing Max wheelchair distance: 134ft    Wheelchair 50 feet with 2 turns activity    Assist        Assist Level: Supervision/Verbal cueing   Wheelchair 150 feet activity     Assist      Assist Level: Supervision/Verbal cueing   Blood pressure 129/68, pulse 81, temperature 98.4 F (36.9 C), resp. rate 16, height 4\' 6"  (1.372 m), weight 66.1 kg, SpO2 95 %.    Medical Problem List and Plan: 1.  Right side weakness secondary to left lateral thalamic infarct likely small vessel disease             -patient may  shower             -ELOS/Goals: 14-16d  Continue CIR  Appreciate SW updating daughter 2.  Impaired mobility: continue Lovenox             -antiplatelet therapy: Aspirin 81 mg daily and Plavix 75 mg day x3 weeks then aspirin alone 3. Right lower extremity pain: continue Tylenol as needed. Right ankle brace ordered.  4. Insomnia: continue Melatonin 10 mg nightly as needed sleep             -antipsychotic agents: N/A 5. Neuropsych: This patient is capable of making decisions on her own behalf. 6. Skin/Wound Care: Routine skin checks 7. Fluids/Electrolytes/Nutrition: Routine in and outs with follow-up chemistries 8.  Permissive hypertension.  Patient on losartan/HCTZ 100/25 daily prior to admission.  d/c losartan given soft DBP 9.  Hyperlipidemia.  LDL 198. Continue Lipitor 10.  CKD stage III.  Baseline creatinine 1.52.  Cr reviewed and current better than baseline, monitor as needed.  11. Tachycardia: resolved, d/c coreg 12. Obesity BMI 35.99: change diet to heart healthy/carb modified.  13. Right knee buckling: knee sleeve ordered 14. GERD without esophagitis: change protonix to PRN. 15. Constipation- 10/30- will give sorbitol 30 cc x1.     LOS: 15 days A FACE TO FACE EVALUATION WAS PERFORMED  11/30 Mcgregor Tinnon 08/08/2021, 12:51 PM

## 2021-08-08 NOTE — Progress Notes (Signed)
Patient ID: Diane Webb, female   DOB: 1927-02-11, 87 y.o.   MRN: [id number]  Family education scheduled Friday 10-12

## 2021-08-08 NOTE — Progress Notes (Signed)
Patient ID: Diane Webb, female   DOB: [birth year], 87 y.o.   MRN: [id number]  SW left VM with pt daughter to schedule family edu. SW will wait for follow up

## 2021-08-08 NOTE — Progress Notes (Signed)
Orthopedic Tech Progress Note Patient Details:  Diane Webb [birth year] [id number]  Called in order to [employer] for a ASO SOFT ANKLE SPLINT   Patient ID: Diane Webb, female   DOB: Jun 15, 1927, 87 y.o.   MRN: [id number]  Donald Pore 08/08/2021, 9:34 AM

## 2021-08-08 NOTE — Progress Notes (Signed)
Occupational Therapy Session Note  Patient Details  Name: Diane Webb MRN: [id number] Date of Birth: 1927-03-10  Today's Date: 08/08/2021 OT Individual Time: 1275-1700 OT Individual Time Calculation (min): 53 min    Short Term Goals: Week 2:  OT Short Term Goal 1 (Week 2): Pt will complete toilet transfer with mod A OT Short Term Goal 1 - Progress (Week 2): Met OT Short Term Goal 2 (Week 2): Pt will complete functional squat pivot transfer for prep for ADLs with min A OT Short Term Goal 2 - Progress (Week 2): Met OT Short Term Goal 3 (Week 2): Pt will complete TTB transfer with mod A OT Short Term Goal 3 - Progress (Week 2): Met Week 3:  OT Short Term Goal 1 (Week 3): STG = LTG due to ELOS  Skilled Therapeutic Interventions/Progress Updates:  Skilled OT intervention completed with focus on functional transfers and dynamic balance. Pt received seated in w/c, agreeable to session. Sit > stand using RW with CGA from w/c, then ambulated to bathroom with RW and min A. Completed shower transfer with stand pivot to TTB, with min A assist needed for decent. Pt able to swivel onto TTB with supervision, able to manage BLEs over threshold. Discussed pt's shower set up, with pt reporting she has grab bars on R side of shower, and reports being able to push up with one hand and pull with R side to stand then exit shower. Also reports normally stepping into shower with grab bar in front of her, then pivoting to sit on shower chair. Attempted having pt side step into shower over threshold with BSC in shower vs TTB to simulate transfer per pt's environment, however pt with decreased command following reporting fatigue. Rest break provided seated in w/c, with therapist providing demonstration of options to enter pt's home shower. Pt has tub/shower available, but daughter is persistent on wanting pt to use walk in shower. Discussed safety with transfers and need to use more independent method of shower  transferring if set ups are available. Pt transported to gym in w/c with total A for time. Used small shower threshold about 2 inches to practice forward stepping, and side stepping using RW and min A, with heavy verbal and R leg facilitation cues required for positioning of BLEs and RW throughout transfers. Pt reported increased fatigue, returned seated in w/c. Discussed purpose of activity. Pt transported back to room, with pt left seated in w/c, with chair alarm on, and all needs in reach at end of session.   Therapy Documentation Precautions:  Precautions Precautions: Fall Precaution Comments: R hemiparesis; Pt's R knee has tendency to buckle during transfers Restrictions Weight Bearing Restrictions: No  Pain: No pain reported    Therapy/Group: Individual Therapy  Mylen Mangan E Apoorva Bugay 08/08/2021, 7:40 AM

## 2021-08-08 NOTE — Progress Notes (Signed)
Physical Therapy Session Note  Patient Details  Name: Diane Webb MRN: [id number] Date of Birth: [birth year]  Today's Date: 08/08/2021 PT Individual Time: 2703-5009 and 1345-1456 PT Individual Time Calculation (min): 53 min and 71 min  Short Term Goals: Week 1:  PT Short Term Goal 1 (Week 1): pt will transfer sit<>stand with LRAD and min A PT Short Term Goal 1 - Progress (Week 1): Progressing toward goal PT Short Term Goal 2 (Week 1): pt will transfer bed<>chair with LRAD and mod A consistantly PT Short Term Goal 2 - Progress (Week 1): Progressing toward goal PT Short Term Goal 3 (Week 1): pt will perform simulated car transfer with LRAD and mod A PT Short Term Goal 3 - Progress (Week 1): Progressing toward goal Week 2:  PT Short Term Goal 1 (Week 2): pt will transfer sit<>stand with LRAD and min A PT Short Term Goal 2 (Week 2): pt will transfer bed<>chair with LRAD and mod A consistantly PT Short Term Goal 3 (Week 2): pt will ambulate 57ft with LRAD and mod A of 1  Skilled Therapeutic Interventions/Progress Updates:   Treatment Session 1 Received pt semi-reclined in bed, pt agreeable to PT treatment, and denied any pain during session but reported "tingling" in RLE. Session with emphasis on functional mobility/transfers, dressing, generalized strengthening, dynamic standing balance/coordination, gait training, simulated car transfers, and improved activity tolerance. Pt requested to get washed up and transferred semi-reclined<>sitting EOB with HOB elevated and supervision. Pt performed the following activities sitting unsupported EOB for ~15 minutes with supervision/set up-assist with emphasis on functional use of RUE: -Doffed dirty shirt and washed underarms and face -Applied deodorant and creams   -Donned pull over shirt and pants up to thighs  -Brushed dentures and combed hair Donned ted hose and shoes with max A and pt transferred sit<>stand with RW and heavy min A and required  mod/max A to pull pants over hips. Stand<>pivot bed<>WC with RW and  min A and pt transported to/from room on 5C in [facility] total A for time management purposes. Pt performed simulated car transfer with RW and heavy min A relying on UEs to assist RLE into car. Discussed equipment for D/C with pt reporting not wanting a WC but requesting RW. Also discussed scheduling family education with daughter on Friday - notified CSW. Pt then ambualted 47ft with RW and min A without DF ace wrap - careful consideration to prevent excessive R ankle inversion/supination but pt unable to feel R ankle therefore looking down at R ankle frequently. Noted soft ankle brace still not delivered - notified MD. Concluded session with pt sitting in WC, needs within reach, and seatbelt alarm on.   Treatment Session 2 Received pt sitting in WC, pt agreeable to PT treatment, and denied any pain during session. Session with emphasis on functional mobility/transfers, dressing, generalized strengthening, dynamic standing balance/coordination, gait training, curb navigation, and improved activity tolerance. Pt transported to/from room in [facility] total A for time management purposes. Donned R soft ankle brace dependently and pt navigated 1 4in step with RW x 2 trials (max A on trial 1 and mod A on trial 2) with cues for technique and stepping sequence. Pt unable to step up with LLE, therefore does better stepping up/down leading with RLE - no buckling noted. Sit<>stands with RW and heavy min/mod A and pt ambulated 45ft x 1 and 16ft x 1 with RW and CGA/min A. No inversion/supination noted and pt reported her R ankle "felt better" with the  soft brace on. Pt did require multiple rest breaks throughout session due to fatigue. Pt transferred WC<>bed stand<>pivot with RW and min A and therapist educated pt's daughter on technique for donning/doffing brace - pt's daughter able to perform without any cues after watching therapist. Concluded session with pt sitting  EOB with all needs within reach and daughter present assisting with changing clothes. NT informed to check on pt in 5 minutes.   Therapy Documentation Precautions:  Precautions Precautions: Fall Precaution Comments: R hemiparesis; Pt's R knee has tendency to buckle during transfers Restrictions Weight Bearing Restrictions: No  Therapy/Group: Individual Therapy Martin Majestic PT, DPT   08/08/2021, 7:12 AM

## 2021-08-09 DIAGNOSIS — I63512 Cerebral infarction due to unspecified occlusion or stenosis of left middle cerebral artery: Secondary | ICD-10-CM | POA: Diagnosis not present

## 2021-08-09 MED ORDER — MAGNESIUM GLUCONATE 500 MG PO TABS
250.0000 mg | ORAL_TABLET | Freq: Every day | ORAL | Status: DC
  Administered 2021-08-13: 250 mg via ORAL
  Filled 2021-08-09 (×5): qty 1

## 2021-08-09 NOTE — Progress Notes (Signed)
Patient ID: Diane Webb, female   DOB: Dec 03, 1926, 87 y.o.   MRN: [id number]  OP referral faxed to [facility] OP

## 2021-08-09 NOTE — Progress Notes (Signed)
Physical Therapy Weekly Progress Note  Patient Details  Name: Diane Webb MRN: [id number] Date of Birth: April 04, 1927  Beginning of progress report period: July 25, 2021 End of progress report period: August 09, 2021  Patient has met 3 of 3 short term goals. Pt demonstrates steady progress towards long term goals. Pt is currently able to perform bed mobility with supervision, transfers with RW and min A, ambulate 51f with RW and min A, and navigate 1 4in curb with RW and mod A. Pt now has R soft ankle brace to prevent R ankle inversion/supination with functional mobility. Pt continues to be limited by R hemiparesis, generalized weakness/deconditioning, fatigue, and decreased balance/postural control. Plan for family education on Friday with daughter.   Patient continues to demonstrate the following deficits muscle weakness, decreased cardiorespiratoy endurance, impaired timing and sequencing, unbalanced muscle activation, decreased coordination, and decreased motor planning, and decreased standing balance, decreased postural control, hemiplegia, and decreased balance strategies and therefore will continue to benefit from skilled PT intervention to increase functional independence with mobility.  Patient progressing toward long term goals..  Continue plan of care.  PT Short Term Goals Week 2:  PT Short Term Goal 1 (Week 2): pt will transfer sit<>stand with LRAD and min A PT Short Term Goal 1 - Progress (Week 2): Met PT Short Term Goal 2 (Week 2): pt will transfer bed<>chair with LRAD and mod A consistantly PT Short Term Goal 2 - Progress (Week 2): Met PT Short Term Goal 3 (Week 2): pt will ambulate 175fwith LRAD and mod A of 1 PT Short Term Goal 3 - Progress (Week 2): Met Week 3:  PT Short Term Goal 1 (Week 3): STG=LTG due to LOS  Skilled Therapeutic Interventions/Progress Updates:  Ambulation/gait training;Discharge planning;Functional mobility training;Psychosocial support;Therapeutic  Activities;Visual/perceptual remediation/compensation;Balance/vestibular training;Disease management/prevention;Neuromuscular re-education;Skin care/wound management;Therapeutic Exercise;Wheelchair propulsion/positioning;Cognitive remediation/compensation;DME/adaptive equipment instruction;Pain management;Splinting/orthotics;UE/LE Strength taining/ROM;Community reintegration;Functional electrical stimulation;Patient/family education;Stair training;UE/LE Coordination activities   Therapy Documentation Precautions:  Precautions Precautions: Fall Precaution Comments: R hemiparesis; Pt's R knee has tendency to buckle during transfers Restrictions Weight Bearing Restrictions: No  Therapy/Group: Individual Therapy AnAlfonse AlpersT, DPT  08/09/2021, 11:51 AM

## 2021-08-09 NOTE — Patient Care Conference (Signed)
Inpatient RehabilitationTeam Conference and Plan of Care Update Date: 08/09/2021   Time: 11:32 AM    Patient Name: Diane Webb      Medical Record Number: [id number]  Date of Birth: [birth year] Sex: Female         Room/Bed: 5C04C/5C04C-01 Payor Info: Payor: MEDICARE / Plan: MEDICARE PART A AND B / Product Type: *No Product type* /    Admit Date/Time:  07/24/2021  3:12 PM  Primary Diagnosis:  Acute stroke due to occlusion of left middle cerebral artery [facility])  Hospital Problems: Principal Problem:   Acute stroke due to occlusion of left middle cerebral artery [facility]) Active Problems:   Left thalamic infarction [facility])    Expected Discharge Date: Expected Discharge Date: 08/15/21  Team Members Present: Physician leading conference: Dr. Sula Soda Social Worker Present: Lavera Guise, BSW Nurse Present: Chana Bode, RN PT Present: Raechel Chute, PT OT Present: Other (comment) [facility] Cheyenne Adas, OT) SLP Present: Eilene Ghazi, SLP PPS Coordinator present : Fae Pippin, SLP     Current Status/Progress Goal Weekly Team Focus  Bowel/Bladder     Continent of bowel and bladder   Continent     Swallow/Nutrition/ Hydration             ADL's   Grooming set up, UBD supervision, LBD min A in standing, stand pivot transfers using RW with min A to TTB and BSC, toileting min-mod A  CGA functional transfers, supervision B/D  ADL retraining, AE education, functional transfers to simulate threshold of pt's shower, functional use of RUE and general endurance   Mobility   bed mobility supervisoin, transfers with RW min/mod A, gait 26ft with RW min A  supervision/min A  functional mobility/transfers, generalized strengthening, dynamic standing balance/coordination, gait training, NMR, and improved activity tolerance.   Communication             Safety/Cognition/ Behavioral Observations            Pain     N/A        Skin     N/A          Discharge Planning:  Pt d/c home with  daughter   Team Discussion: Right ankle brace recommended for weakness, and rolling ankle due to decreased sensation of right leg. GERD addressed per MD.  Patient on target to meet rehab goals: yes, currently needs supervision for upper body care and min assist for lower body care. Needs mi - mod assist for peri-care and supervision for showering. CGA for transfers.  *See Care Plan and progress notes for long and short-term goals.   Revisions to Treatment Plan:  Ankle brace ordered   Teaching Needs: Transfers, safety, toileting, medications and secondary risk management, etc  Current Barriers to Discharge: Decreased caregiver support and Home enviroment access/layout  Possible Resolutions to Barriers: Family education with daughter OP follow up services DME: RW     Medical Summary Current Status: right leg numbness, constipation, right leg weakness, GERD without esophagitis, stage 3 CKD, acute occlusion of left MCA  Barriers to Discharge: Medical stability  Barriers to Discharge Comments: right leg numbness, constipation, right leg weakness, GERD without esophagitis, stage 3 CKD, acute occlusion of left MCA Possible Resolutions to [employer]: ordered right ankle brace, changed protonic to PRN, monitor creatinine as needed and encourage hydration, continue aspirin and monitor for signs of bleeding   Continued Need for Acute Rehabilitation Level of Care: The patient requires daily medical management by a physician with specialized training in  physical medicine and rehabilitation for the following reasons: Direction of a multidisciplinary physical rehabilitation program to maximize functional independence : Yes Medical management of patient stability for increased activity during participation in an intensive rehabilitation regime.: Yes Analysis of laboratory values and/or radiology reports with any subsequent need for medication adjustment and/or medical intervention. :  Yes   I attest that I was present, lead the team conference, and concur with the assessment and plan of the team.   Chana Bode B 08/09/2021, 4:53 PM

## 2021-08-09 NOTE — Progress Notes (Signed)
Occupational Therapy Session Note  Patient Details  Name: Diane Webb MRN: [id number] Date of Birth: 10-02-1926  Today's Date: 08/09/2021 OT Individual Time: 1005-1058 OT Individual Time Calculation (min): 53 min    Short Term Goals: Week 2:  OT Short Term Goal 1 (Week 2): Pt will complete toilet transfer with mod A OT Short Term Goal 1 - Progress (Week 2): Met OT Short Term Goal 2 (Week 2): Pt will complete functional squat pivot transfer for prep for ADLs with min A OT Short Term Goal 2 - Progress (Week 2): Met OT Short Term Goal 3 (Week 2): Pt will complete TTB transfer with mod A OT Short Term Goal 3 - Progress (Week 2): Met Week 3:  OT Short Term Goal 1 (Week 3): STG = LTG due to ELOS  Skilled Therapeutic Interventions/Progress Updates:  Skilled OT intervention completed with focus on ADL retraining and functional transfers. Pt received seated in w/c, agreeable to session. Pt opted to complete full shower this session. Sit > stand using RW, ambulated to bathroom and transferred to TTB all with min A. Pt required cues for positioning of RW in bathroom. Completed all bathing with supervision assist, including hair washing (both elbows on knees method due to limited shoulder AROM) and feet (with good safety demonstrated during forward lean without LH sponge). Pt demonstrated increased activity tolerance this session. Educated on importance of energy conservation with full shower by using luke warm water and pacing self throughout task. Pt able to complete UB dress with min A, and threading brief pre-looped/pants with supervision and min A needed for pulling over hips while in stance using RW and CGA for balance. Donned grip socks with total A for time. Ambulated with RW to w/c in room, with Min A. Pt with request of applying lotion to BLEs, followed by donning of TEDS and RLE ankle brace, and footwear all with total A due to time. Completed grooming tasks seated in w/c with set up A. Pt  completes grooming best seated due to decreased AROM in shoulder and uses compensatory method for tasks at baseline. Educated on recommendations for shower method at home, with pt presenting safer using TTB with tub shower vs walk in shower with one grab bar due to inaccessibility and decreased safety with pt's CLOF. Discussed plan to educate daughter/family on Friday about shower transfers. Pt left seated in w/c, with alarm belt on, and all needs in reach/on pt's intact side at end of session.  Therapy Documentation Precautions:  Precautions Precautions: Fall Precaution Comments: R hemiparesis; Pt's R knee has tendency to buckle during transfers Restrictions Weight Bearing Restrictions: No  Pain: No c/o pain   Therapy/Group: Individual Therapy  Chrishaun Sasso E Larae Caison 08/09/2021, 7:40 AM

## 2021-08-09 NOTE — Progress Notes (Signed)
Patient ID: Diane Webb, female   DOB: August 23, 1927, 87 y.o.   MRN: [id number] Team Conference Report to Patient/Family  Team Conference discussion was reviewed with the patient and caregiver, including goals, any changes in plan of care and target discharge date.  Patient and caregiver express understanding and are in agreement.  The patient has a target discharge date of 08/15/21.  Sw met with pt, provided team conference updates. Informed pt of family education scheduled this week with her daughter. No additional questions or concerns.   [name] 08/09/2021, 1:53 PM

## 2021-08-09 NOTE — Progress Notes (Signed)
Physical Therapy Session Note  Patient Details  Name: Diane Webb MRN: [id number] Date of Birth: 06/06/1927  Today's Date: 08/09/2021 PT Individual Time: 0805-0900 PT Individual Time Calculation (min): 55 min   Short Term Goals: Week 2:  PT Short Term Goal 1 (Week 2): pt will transfer sit<>stand with LRAD and min A PT Short Term Goal 1 - Progress (Week 2): Met PT Short Term Goal 2 (Week 2): pt will transfer bed<>chair with LRAD and mod A consistantly PT Short Term Goal 2 - Progress (Week 2): Met PT Short Term Goal 3 (Week 2): pt will ambulate 15ft with LRAD and mod A of 1 PT Short Term Goal 3 - Progress (Week 2): Met  Skilled Therapeutic Interventions/Progress Updates: Pt presented in bed agreeable to therapy. Pt denies pain at start of session. Pt performed supine to sit with supervision and use of bed features. Pt doffed nightgown and donned sweatshirt and pants with set up. PTA donned TED hose, soft ankle brace, and shoes total A for time management. Performed Sit to stand with modA and verbal cues for hand placement and PTA pulled up pants over hips. Performed stand pivot to w/c with CGA and pt transported to 4W for time management. Pt participated in Cybex Kinetron 50cm/sec 2 min x 2 for BLE strengthening and conditioning as well as forced use of RLE. Pt then performed Sit to stand x 5 from elevated mat for block practice and BLE strengthening. Pt required multimodal cues for increasing anterior lean to allow decreased reliance on BUE. Pt then participated in LAQ and seated hip flexion with 1.5lb cuff for general strengthening. Pt ambulated 75ft with RW and CGA with cues for improved erect posture and R foot clearance. Pt transported back to 5C and remained in w/c at end of session with belt alarm on, call bell within reach and needs met.   Therapy Documentation Precautions:  Precautions Precautions: Fall Precaution Comments: R hemiparesis; Pt's R knee has tendency to buckle during  transfers Restrictions Weight Bearing Restrictions: No General:   Vital Signs:  Pain:   Mobility:   Locomotion :    Trunk/Postural Assessment :    Balance:   Exercises:   Other Treatments:      Therapy/Group: Individual Therapy  Rosita DeChalus 08/09/2021, 3:24 PM  

## 2021-08-09 NOTE — Progress Notes (Signed)
Physical Therapy Session Note  Patient Details  Name: Diane Webb MRN: [id number] Date of Birth: 1927-04-09  Today's Date: 08/09/2021 PT Individual Time: 1937-9024 PT Individual Time Calculation (min): 38 min   Short Term Goals: Week 1:  PT Short Term Goal 1 (Week 1): pt will transfer sit<>stand with LRAD and min A PT Short Term Goal 1 - Progress (Week 1): Progressing toward goal PT Short Term Goal 2 (Week 1): pt will transfer bed<>chair with LRAD and mod A consistantly PT Short Term Goal 2 - Progress (Week 1): Progressing toward goal PT Short Term Goal 3 (Week 1): pt will perform simulated car transfer with LRAD and mod A PT Short Term Goal 3 - Progress (Week 1): Progressing toward goal Week 2:  PT Short Term Goal 1 (Week 2): pt will transfer sit<>stand with LRAD and min A PT Short Term Goal 2 (Week 2): pt will transfer bed<>chair with LRAD and mod A consistantly PT Short Term Goal 3 (Week 2): pt will ambulate 60ft with LRAD and mod A of 1  Skilled Therapeutic Interventions/Progress Updates:   Received pt sitting in WC, pt agreeable to PT treatment, and denied any pain during session. Session with emphasis on functional mobility/transfers, generalized strengthening, dynamic standing balance/coordination, gait training, NMR, and improved activity tolerance. R soft ankle brace donned during session. Pt transported to/from room in [facility] total A for time management purposes. Sit<>stand with RW and heavy min A and ambulated 16ft with RW and CGA. Pt demonstrates improvements in gait speed, R foot clearance, and no R ankle inversion noted. Pt performed 2x10 mini squats with BUE support on RW and CGA/min A for balance with emphasis on quad strength - verbal and tactile cues for weight shifting to R; pt with difficulty sequencing movement. Worked on blocked practice sit<>stands without AD x 5 reps with mod handheld assist and guarding R knee - noted significant crepitus and pt with tendency to avoid  putting weight on RLE. Pt required multiple rest breaks throughout session due to fatigue. Pt performed x 2 additional stand<>pivot transfers with RW and min A throughout session. Pt requested to return to bed and doffed R ankle brace, shoes, and ted hose with max A and transferred sit<>supine with supervision. Scooted to [facility] with supervision pulling on headboard with use of Trendelenburg bed position. Concluded session with pt semi-reclined in bed, needs within reach, and bed alarm on.   Therapy Documentation Precautions:  Precautions Precautions: Fall Precaution Comments: R hemiparesis; Pt's R knee has tendency to buckle during transfers Restrictions Weight Bearing Restrictions: No  Therapy/Group: Individual Therapy Martin Majestic PT, DPT   08/09/2021, 7:39 AM

## 2021-08-09 NOTE — Progress Notes (Signed)
Occupational Therapy Session Note  Patient Details  Name: Diane Webb MRN: [id number] Date of Birth: Jun 04, 1927  Today's Date: 08/09/2021 OT Individual Time: 0919-8022 OT Individual Time Calculation (min): 39 min    Short Term Goals: Week 2:  OT Short Term Goal 1 (Week 2): Pt will complete toilet transfer with mod A OT Short Term Goal 1 - Progress (Week 2): Met OT Short Term Goal 2 (Week 2): Pt will complete functional squat pivot transfer for prep for ADLs with min A OT Short Term Goal 2 - Progress (Week 2): Met OT Short Term Goal 3 (Week 2): Pt will complete TTB transfer with mod A OT Short Term Goal 3 - Progress (Week 2): Met Week 3:  OT Short Term Goal 1 (Week 3): STG = LTG due to ELOS  Skilled Therapeutic Interventions/Progress Updates:  Skilled OT intervention completed with focus on functional and simulated transfers for d/c readiness. Pt received seated in w/c agreeable to session. Transported in w/c with total A for time to mock restroom for practice with tub/shower transfers. Pt completed sit > stand and ambulation to TTB using RW and min A. Pt able to manage BLEs over tub threshold with supervision. Educated on the independence level of this shower transfer with TTB at supervision vs trying to step over threshold and step sideways/pivot in shower at mod A and mod-max cues. Simulated shower transfer with small 2 inch lip, with pt able to enter forwards with min A using RW and mod cues for feet placement, however not sure that pt's RW will fit going in forwards. Pt attempted side step over threshold with min A and mod-max cues for placements however pt with decreased safety and confusion with positioning. Educated on recommendations for shower transfers at home using availble TTB, to increase independence/safety and decrease caregiver burden. Pt in agreement when asked to assess level of difficulty between the two transfer methods. Plan to educate daughter at family ed Friday.  Transported pt in w/c with total A, for time back to room. Pt left seated in w/c, with alarm belt on and all needs in reach at end of session.  Therapy Documentation Precautions:  Precautions Precautions: Fall Precaution Comments: R hemiparesis; Pt's R knee has tendency to buckle during transfers Restrictions Weight Bearing Restrictions: No  Pain: No c/o pain   Therapy/Group: Individual Therapy  Ethanael Veith E Annarose Ouellet 08/09/2021, 7:42 AM

## 2021-08-09 NOTE — Progress Notes (Signed)
PROGRESS NOTE   Subjective/Complaints: Complaints of right sided leg numbness and weakness Last BM on the 5th.  Seen ambulating so well in the hallway today with [name]!! Team conference today  ROS:  Pt denies SOB, abd pain, CP, N/V/C/D, and vision changes  Objective:   No results found. No results for input(s): WBC, HGB, HCT, PLT in the last 72 hours.  No results for input(s): NA, K, CL, CO2, GLUCOSE, BUN, CREATININE, CALCIUM in the last 72 hours.   Intake/Output Summary (Last 24 hours) at 08/09/2021 1134 Last data filed at 08/09/2021 0745 Gross per 24 hour  Intake 480 ml  Output --  Net 480 ml        Physical Exam: Vital Signs Blood pressure (!) 125/59, pulse 76, temperature 98.2 F (36.8 C), temperature source Oral, resp. rate 16, height 4\' 6"  (1.372 m), weight 66.1 kg, SpO2 94 %. Gen: no distress, normal appearing HEENT: oral mucosa pink and moist, NCAT Cardio: Reg rate Chest: normal effort, normal rate of breathing Abd: soft, non-distended Ext: no edema Psych: pleasant, normal affect Skin: intact Neurologic: Cranial nerves II through XII intact, motor strength is 5/5 in left and 4/5 RIght  deltoid, bicep, tricep, grip, hip flexor, knee extensors, ankle dorsiflexor and plantar flexor Sensory exam normal sensation to light touch and proprioception in left upper and lower extremities, absent on right side  Sensory ataxia  Right finger nose finger and RIght heel shin Musculoskeletal: Full range of motion in all 4 extremities. No joint swelling   Assessment/Plan: 1. Functional deficits which require 3+ hours per day of interdisciplinary therapy in a comprehensive inpatient rehab setting. Physiatrist is providing close team supervision and 24 hour management of active medical problems listed below. Physiatrist and rehab team continue to assess barriers to discharge/monitor patient progress toward functional and  medical goals  Care Tool:  Bathing    Body parts bathed by patient: Right arm, Left arm, Chest, Abdomen, Right upper leg, Left upper leg, Face, Left lower leg, Right lower leg, Front perineal area, Buttocks   Body parts bathed by helper: Buttocks, Right lower leg, Left lower leg     Bathing assist Assist Level: Supervision/Verbal cueing     Upper Body Dressing/Undressing Upper body dressing   What is the patient wearing?: Pull over shirt    Upper body assist Assist Level: Minimal Assistance - Patient > 75%    Lower Body Dressing/Undressing Lower body dressing      What is the patient wearing?: Incontinence brief, Pants     Lower body assist Assist for lower body dressing: Minimal Assistance - Patient > 75%     Toileting Toileting    Toileting assist Assist for toileting: Moderate Assistance - Patient 50 - 74%     Transfers Chair/bed transfer  Transfers assist     Chair/bed transfer assist level: Minimal Assistance - Patient > 75% Chair/bed transfer assistive device:   Ambulation assist   Ambulation activity did not occur: Safety/medical concerns (fatigue, weakness, decreased balance/postural control)  Assist level: Minimal Assistance - Patient > 75% Assistive device: Walker-rolling Max distance: 55ft   Walk 10 feet activity   Assist  Walk 10 feet activity did not occur: Safety/medical concerns (fatigue, weakness, decreased balance/postural control)  Assist level: Minimal Assistance - Patient > 75% Assistive device: Walker-rolling   Walk 50 feet activity   Assist Walk 50 feet with 2 turns activity did not occur: Safety/medical concerns (fatigue, weakness, decreased balance/postural control)  Assist level: Minimal Assistance - Patient > 75% Assistive device: Walker-rolling    Walk 150 feet activity   Assist Walk 150 feet activity did not occur: Safety/medical concerns (fatigue, weakness, decreased  balance/postural control)         Walk 10 feet on uneven surface  activity   Assist Walk 10 feet on uneven surfaces activity did not occur: Safety/medical concerns (fatigue, weakness, decreased balance/postural control)         Wheelchair     Assist Is the patient using a wheelchair?: Yes Type of Wheelchair: Manual    Wheelchair assist level: Supervision/Verbal cueing Max wheelchair distance: 162ft    Wheelchair 50 feet with 2 turns activity    Assist        Assist Level: Supervision/Verbal cueing   Wheelchair 150 feet activity     Assist      Assist Level: Supervision/Verbal cueing   Blood pressure (!) 125/59, pulse 76, temperature 98.2 F (36.8 C), temperature source Oral, resp. rate 16, height 4\' 6"  (1.372 m), weight 66.1 kg, SpO2 94 %.    Medical Problem List and Plan: 1.  Right side weakness secondary to left lateral thalamic infarct likely small vessel disease             -patient may  shower             -ELOS/Goals: 14-16d  Continue CIR  Appreciate SW updating daughter 2.  Impaired mobility: d/c Lovenox since ambulating >160 feet             -antiplatelet therapy: Aspirin 81 mg daily and Plavix 75 mg day x3 weeks then aspirin alone 3. Right lower extremity pain: continue Tylenol as needed. Right ankle brace ordered.  4. Insomnia: Continue Melatonin 10 mg nightly as needed sleep             -antipsychotic agents: N/A 5. Neuropsych: This patient is capable of making decisions on her own behalf. 6. Skin/Wound Care: Routine skin checks 7. Fluids/Electrolytes/Nutrition: Routine in and outs with follow-up chemistries 8.  Permissive hypertension.  Patient on losartan/HCTZ 100/25 daily prior to admission.  d/c losartan given soft DBP 9.  Hyperlipidemia.  LDL 198. Continue Lipitor 10.  CKD stage III.  Baseline creatinine 1.52.  Cr reviewed and current better than baseline, monitor as needed.  11. Tachycardia: resolved, d/c coreg 12.  Obesity BMI 35.99: change diet to heart healthy/carb modified.  13. Right knee buckling: knee sleeve ordered 14. GERD without esophagitis: change protonix to PRN. 15. Constipation- resolved. Add magnesium gluconate 250mg  at night since still not regular.    LOS: 16 days A FACE TO FACE EVALUATION WAS PERFORMED  [name] 08/09/2021, 11:34 AM

## 2021-08-10 DIAGNOSIS — I63512 Cerebral infarction due to unspecified occlusion or stenosis of left middle cerebral artery: Secondary | ICD-10-CM | POA: Diagnosis not present

## 2021-08-10 NOTE — Progress Notes (Signed)
Physical Therapy Session Note  Patient Details  Name: Diane Webb MRN: [id number] Date of Birth: 05/21/1927  Today's Date: 08/10/2021 PT Individual Time: 1100-1154 PT Individual Time Calculation (min): 54 min   Short Term Goals: Week 2:  PT Short Term Goal 1 (Week 2): pt will transfer sit<>stand with LRAD and min A PT Short Term Goal 1 - Progress (Week 2): Met PT Short Term Goal 2 (Week 2): pt will transfer bed<>chair with LRAD and mod A consistantly PT Short Term Goal 2 - Progress (Week 2): Met PT Short Term Goal 3 (Week 2): pt will ambulate 15ft with LRAD and mod A of 1 PT Short Term Goal 3 - Progress (Week 2): Met Week 3:  PT Short Term Goal 1 (Week 3): STG=LTG due to LOS  Skilled Therapeutic Interventions/Progress Updates:   Received pt sitting in WC, pt agreeable to PT treatment, and denied any pain during session. Session with emphasis on discharge planning, functional mobility/transfers, dynamic standing balance/coordination, curb navigation, gait training, and improved activity tolerance. R soft ankle brace donned throughout session to prevent ankle inversion. Pt transported to/from room on 5C in WC total A for time management purposes. Plan to practice car transfer but ortho gym in use for group therapy session. Pt navigated 1 4in curb with RW and min A  x 2 trials to simulate home entry. Pt alternated ascending and descending with L and R foot, and demonstrated no LOB. In dayroom, pt ambulated 180ft with RW and CGA. Pt ambulates with decreased cadence, narrow BOS, flexed trunk, and decreased foot clearance R>L but no ankle inversion seen. Pt performed BUE/LE strengthening on Nustep at workload 3 for 8 minutes for a total of 490 steps with emphasis on cardiovascular endurance and reciprocal movement training. Stand<>pivot Nustep<>WC with RW and min A. Concluded session with pt sitting in WC, needs within reach, and seatbelt alarm on  Therapy Documentation Precautions:   Precautions Precautions: Fall Precaution Comments: R hemiparesis; Pt's R knee has tendency to buckle during transfers Restrictions Weight Bearing Restrictions: No  Therapy/Group: Individual Therapy [name] M Johnson [name] Johnson PT, DPT   08/10/2021, 7:27 AM  

## 2021-08-10 NOTE — Progress Notes (Signed)
PROGRESS NOTE   Subjective/Complaints: No new complaints On the phone Patient's chart reviewed- No issues reported overnight Vitals signs stable except for elevated SBP  ROS:  Pt denies SOB, abd pain, CP, N/V/C/D, and vision changes  Objective:   No results found. No results for input(s): WBC, HGB, HCT, PLT in the last 72 hours.  No results for input(s): NA, K, CL, CO2, GLUCOSE, BUN, CREATININE, CALCIUM in the last 72 hours.   Intake/Output Summary (Last 24 hours) at 08/10/2021 1050 Last data filed at 08/10/2021 0808 Gross per 24 hour  Intake 640 ml  Output --  Net 640 ml        Physical Exam: Vital Signs Blood pressure (!) 147/70, pulse 85, temperature 98 F (36.7 C), resp. rate 16, height 4\' 6"  (1.372 m), weight 66.1 kg, SpO2 94 %. Gen: no distress, normal appearing HEENT: oral mucosa pink and moist, NCAT Cardio: Reg rate Chest: normal effort, normal rate of breathing Abd: soft, non-distended Ext: no edema Psych: pleasant, normal affect Skin: intact Neurologic: Cranial nerves II through XII intact, motor strength is 5/5 in left and 4/5 RIght  deltoid, bicep, tricep, grip, hip flexor, knee extensors, ankle dorsiflexor and plantar flexor Sensory exam normal sensation to light touch and proprioception in left upper and lower extremities, absent on right side  Sensory ataxia  Right finger nose finger and RIght heel shin Musculoskeletal: Full range of motion in all 4 extremities. No joint swelling Ambulating in hallway with RW and [name]'s assist   Assessment/Plan: 1. Functional deficits which require 3+ hours per day of interdisciplinary therapy in a comprehensive inpatient rehab setting. Physiatrist is providing close team supervision and 24 hour management of active medical problems listed below. Physiatrist and rehab team continue to assess barriers to discharge/monitor patient progress toward functional and  medical goals  Care Tool:  Bathing    Body parts bathed by patient: Right arm, Left arm, Chest, Abdomen, Right upper leg, Left upper leg, Face, Left lower leg, Right lower leg, Front perineal area, Buttocks   Body parts bathed by helper: Buttocks, Right lower leg, Left lower leg     Bathing assist Assist Level: Supervision/Verbal cueing     Upper Body Dressing/Undressing Upper body dressing   What is the patient wearing?: Pull over shirt    Upper body assist Assist Level: Minimal Assistance - Patient > 75%    Lower Body Dressing/Undressing Lower body dressing      What is the patient wearing?: Incontinence brief, Pants     Lower body assist Assist for lower body dressing: Minimal Assistance - Patient > 75%     Toileting Toileting    Toileting assist Assist for toileting: Moderate Assistance - Patient 50 - 74%     Transfers Chair/bed transfer  Transfers assist     Chair/bed transfer assist level: Minimal Assistance - Patient > 75% Chair/bed transfer assistive device:   Ambulation assist   Ambulation activity did not occur: Safety/medical concerns (fatigue, weakness, decreased balance/postural control)  Assist level: Contact Guard/Touching assist Assistive device: Walker-rolling Max distance: 140ft   Walk 10 feet activity   Assist  Walk 10 feet activity  did not occur: Safety/medical concerns (fatigue, weakness, decreased balance/postural control)  Assist level: Contact Guard/Touching assist Assistive device: Walker-rolling   Walk 50 feet activity   Assist Walk 50 feet with 2 turns activity did not occur: Safety/medical concerns (fatigue, weakness, decreased balance/postural control)  Assist level: Contact Guard/Touching assist Assistive device: Walker-rolling    Walk 150 feet activity   Assist Walk 150 feet activity did not occur: Safety/medical concerns (fatigue, weakness, decreased balance/postural  control)  Assist level: Contact Guard/Touching assist Assistive device: Walker-rolling    Walk 10 feet on uneven surface  activity   Assist Walk 10 feet on uneven surfaces activity did not occur: Safety/medical concerns (fatigue, weakness, decreased balance/postural control)         Wheelchair     Assist Is the patient using a wheelchair?: Yes Type of Wheelchair: Manual    Wheelchair assist level: Supervision/Verbal cueing Max wheelchair distance: 155ft    Wheelchair 50 feet with 2 turns activity    Assist        Assist Level: Supervision/Verbal cueing   Wheelchair 150 feet activity     Assist      Assist Level: Supervision/Verbal cueing   Blood pressure (!) 147/70, pulse 85, temperature 98 F (36.7 C), resp. rate 16, height 4\' 6"  (1.372 m), weight 66.1 kg, SpO2 94 %.    Medical Problem List and Plan: 1.  Right side weakness secondary to left lateral thalamic infarct likely small vessel disease             -patient may  shower             -ELOS/Goals: 14-16d  Continue CIR  Appreciate SW updating daughter 2.  Impaired mobility: d/c Lovenox since ambulating >160 feet             -antiplatelet therapy: Continue Aspirin 81 mg daily and Plavix 75 mg day x3 weeks then aspirin alone 3. Right lower extremity pain: continue Tylenol as needed. Right ankle brace ordered.  4. Insomnia: continue  Melatonin 10 mg nightly as needed sleep             -antipsychotic agents: N/A 5. Neuropsych: This patient is capable of making decisions on her own behalf. 6. Skin/Wound Care: Routine skin checks 7. Fluids/Electrolytes/Nutrition: Routine in and outs with follow-up chemistries 8.  Permissive hypertension.  Patient on losartan/HCTZ 100/25 daily prior to admission.  d/c losartan given soft DBP- keep off all BP meds for now 9.  Hyperlipidemia.  LDL 198. Continue Lipitor 10.  CKD stage III.  Baseline creatinine 1.52.  Cr reviewed and current better than baseline,  monitor as needed.  11. Tachycardia: resolved, d/c coreg 12. Obesity BMI 35.99: change diet to heart healthy/carb modified.  13. Right knee buckling: knee sleeve ordered 14. GERD without esophagitis: change protonix to PRN. 15. Constipation- resolved. Add magnesium gluconate 250mg  at night since still not regular.    LOS: 17 days A FACE TO FACE EVALUATION WAS PERFORMED  Diane Webb 08/10/2021, 10:50 AM

## 2021-08-10 NOTE — Progress Notes (Signed)
Occupational Therapy Session Note  Patient Details  Name: Diane Webb MRN: [id number] Date of Birth: 04/14/1927  Today's Date: 08/10/2021 OT Individual Time: 8786-7672 OT Individual Time Calculation (min): 72 min    Short Term Goals: Week 3:  OT Short Term Goal 1 (Week 3): STG = LTG due to ELOS  Skilled Therapeutic Interventions/Progress Updates:  Pt greeted seated in w/c agreeable to OT intervention. Session focus on BADL reeducation, functional mobility, endurance and RUE FMC. Pt completed ambulatory toilet transer from w/c >BR with CGA with RW, CGA for sit<>stand from w/c and BSC over toilet with use of grab bars. Pt completed 3/3 toileting tasks with MIN A only needing assist to pull pants up on back side. Pt transported to apt with total A from w/c for time mgmt. Pt completed functional mobility in ADL apt with pt completing sit<>stands from w/c, recliner and EOB with CGA with RW. Pt completed functional mobility with rw and CGA throughout apt. Pt completed sit<>supine with CGA from flat bed. Pt transported to gym with total A from w/c. Pt able to stand for 3 mins to complete [facility] task with pt instructed to thread beads on string with R hand, pt sit<>stand from w/c with CGA and required supervision for dynamic standing balance. Pt completed seated IADL task of med mgmt to faciliate improvements with RUE FMC. Pt able to place all pills with 100% accuracy based on directions provided on pill bottles and MIN verbal cues only to orient pt to layout of pillbox. Pt reports at home her daughter does supervise her while she distributes the pills and assists with open pill bottles as needed. Pt additionally able to stand for 2 min to engage in [facility] task of Jenga with pt using RUE to remove jenga blocks from tower to facilitate RUE [facility] and coordination as precursor to higher level ADL tasks. Pt able to complete functional mobiltiy greater than a household distance with rw and CGA back towards her room ( ~  40 ft). Pt returned to room in w/c with total A d/t fatigue. CGA for stand pivot from w/c>EOB.  Pt left supine in bed with bed alarm activated and all needs within reach.                     Therapy Documentation Precautions:  Precautions Precautions: Fall Precaution Comments: R hemiparesis Required Braces or Orthoses:  (Has R knee sleeve and R soft ankle brace) Restrictions Weight Bearing Restrictions: No   Pain:pt reports no pain during session      Therapy/Group: Individual Therapy  Barron Schmid 08/10/2021, 3:40 PM

## 2021-08-10 NOTE — Plan of Care (Signed)
  Problem: RH Wheelchair Mobility Goal: LTG Patient will propel w/c in controlled environment (PT) Description: LTG: Patient will propel wheelchair in controlled environment, # of feet with assist (PT) Outcome: Not Applicable Flowsheets (Taken 08/10/2021 0739) LTG: Pt will propel w/c in controlled environ  assist needed:: (D/C) -- Goal: LTG Patient will propel w/c in home environment (PT) Description: LTG: Patient will propel wheelchair in home environment, # of feet with assistance (PT). Outcome: Not Applicable Flowsheets (Taken 08/10/2021 0739) LTG: Pt will propel w/c in home environ  assist needed:: (D/C) --

## 2021-08-10 NOTE — Progress Notes (Signed)
Physical Therapy Discharge Summary  Patient Details  Name: Diane Webb MRN: [id number] Date of Birth: [birth year]  Patient has met 9 of 9 long term goals due to improved activity tolerance, improved balance, improved postural control, increased strength, functional use of  right upper extremity and right lower extremity, improved awareness, and improved coordination. Patient to discharge at an ambulatory level Supervision.  Patient's care partner is independent to provide the necessary physical and cognitive assistance at discharge. Pt's daughter attended family education training and verbalized and demonstrated confidence with all tasks to ensure safe discharge home.   All goals met   Recommendation:  Patient will benefit from ongoing skilled PT services in outpatient setting to continue to advance safe functional mobility, address ongoing impairments in transfers, generalized strengthening, dynamic standing balance/coordination, NMR, gait training, endurance, and to minimize fall risk.  Equipment: RW  Reasons for discharge: treatment goals met  Patient/family agrees with progress made and goals achieved: Yes  PT Discharge Precautions/Restrictions Precautions Precautions: Fall Precaution Comments: R hemiparesis Required Braces or Orthoses:  (Has R knee sleeve and R soft ankle brace) Restrictions Weight Bearing Restrictions: No Pain Interference Pain Interference Pain Effect on Sleep: 0. Does not apply - I have not had any pain or hurting in the past 5 days Pain Interference with Therapy Activities: 0. Does not apply - I have not received rehabilitationtherapy in the past 5 days Pain Interference with Day-to-Day Activities: 1. Rarely or not at all Cognition Overall Cognitive Status: Within Functional Limits for tasks assessed Arousal/Alertness: Awake/alert Orientation Level: Oriented X4 Memory: Appears intact Awareness: Appears intact Problem Solving: Impaired (but improved  since eval) Safety/Judgment: Appears intact Sensation Sensation Light Touch: Impaired by gross assessment Proprioception: Impaired by gross assessment Additional Comments: pt reports "tingling" on R side and inability to feel RLE Coordination Gross Motor Movements are Fluid and Coordinated: No Fine Motor Movements are Fluid and Coordinated: No Coordination and Movement Description: R hemiparesis, generalized weakness/deconditioning Finger Nose Finger Test: dysmetria on RUE, limited shoulder ROM on LUE due to arthritis Heel Shin Test: decreased coordination RLE, WFL LLE Motor  Motor Motor: Hemiplegia Motor - Skilled Clinical Observations: R hemiparesis, generalized weakness/deconditioning  Mobility Bed Mobility Bed Mobility: Rolling Right;Rolling Left;Supine to Sit;Sit to Supine Rolling Right: Independent with assistive device Rolling Left: Independent with assistive device Supine to Sit: Independent with assistive device Sit to Supine: Independent with assistive device Transfers Transfers: Sit to Stand;Stand to Sit;Stand Pivot Transfers Sit to Stand: Supervision/Verbal cueing Stand to Sit: Supervision/Verbal cueing Stand Pivot Transfers: Supervision/Verbal cueing Transfer (Assistive device): Rolling walker Locomotion  Gait Ambulation: Yes Gait Assistance: Supervision/Verbal cueing Gait Distance (Feet): 150 Feet Assistive device: Rolling walker Gait Assistance Details: Verbal cues for safe use of DME/AE Gait Assistance Details: verbal cues for RW safety Gait Gait: Yes Gait Pattern: Impaired Gait Pattern: Step-through pattern;Decreased step length - right;Decreased step length - left;Decreased stride length;Trunk flexed;Poor foot clearance - right;Poor foot clearance - left;Narrow base of support Gait velocity: Decreased Stairs / Additional Locomotion Stairs: Yes Stairs Assistance: Contact Guard/Touching assist Stair Management Technique: With walker Number of Stairs:  1 Height of Stairs: 4 Curb: Contact Guard/Touching assist (RW) Wheelchair Mobility Wheelchair Mobility: Yes Wheelchair Assistance: Chartered loss adjuster: Both upper extremities Wheelchair Parts Management: Needs assistance Distance: 134f  Trunk/Postural Assessment  Cervical Assessment Cervical Assessment: Exceptions to [facility](forward head) Thoracic Assessment Thoracic Assessment: Exceptions to [facility]kyphosis) Lumbar Assessment Lumbar Assessment: Exceptions to [facility](posterior pelvic tilt) Postural Control Postural Control: Within Functional Limits  Balance Balance Balance Assessed: Yes Static Sitting Balance Static Sitting - Balance Support: Feet supported;No upper extremity supported Static Sitting - Level of Assistance: 6: Modified independent (Device/Increase time) Dynamic Sitting Balance Dynamic Sitting - Balance Support: Feet supported;No upper extremity supported Dynamic Sitting - Level of Assistance: 6: Modified independent (Device/Increase time) Static Standing Balance Static Standing - Balance Support: Bilateral upper extremity supported (RW) Dynamic Standing Balance Dynamic Standing - Balance Support: Bilateral upper extremity supported (RW) Dynamic Standing - Comments: with transfers and gait Extremity Assessment  RLE Assessment RLE Assessment: Exceptions to [facility] RLE Strength RLE Overall Strength Comments: grossly generalized to 4/5 LLE Assessment LLE Assessment: Exceptions to [facility] General Strength Comments: grossly generalized to 4+/5  [name] PT, DPT  08/10/2021, 7:36 AM

## 2021-08-10 NOTE — Progress Notes (Signed)
Occupational Therapy Session Note  Patient Details  Name: Diane Webb MRN: [id number] Date of Birth: [birth year]  Today's Date: 08/10/2021 OT Individual Time:  -       Short Term Goals: Week 1:  OT Short Term Goal 1 (Week 1): Pt will perform sit <> stand with mod A in prep for ADLs OT Short Term Goal 1 - Progress (Week 1): Met OT Short Term Goal 2 (Week 1): Pt will complete LB dressing with min A OT Short Term Goal 2 - Progress (Week 1): Met OT Short Term Goal 3 (Week 1): Pt will complete toilet transfer with mod A OT Short Term Goal 3 - Progress (Week 1): Progressing toward goal Week 2:  OT Short Term Goal 1 (Week 2): Pt will complete toilet transfer with mod A OT Short Term Goal 1 - Progress (Week 2): Met OT Short Term Goal 2 (Week 2): Pt will complete functional squat pivot transfer for prep for ADLs with min A OT Short Term Goal 2 - Progress (Week 2): Met OT Short Term Goal 3 (Week 2): Pt will complete TTB transfer with mod A OT Short Term Goal 3 - Progress (Week 2): Met Week 3:  OT Short Term Goal 1 (Week 3): STG = LTG due to ELOS  Skilled Therapeutic Interventions/Progress Updates:    Patient resting in bed upon arrival, I introduced myself and inquired as to whether she would like to complete a bathing task, she indicated that she would like to take a sponge bath.  The pt was able to wash her UB with s/u assist, she was required Mod/MaxA for bathing her lower legs and her bottom.  The pt was ModA for donning the pull up and her pants for clearing her feet and donning the pants over her bottom. She was able to put her shirt on independently with additional time.  The pt was MaxA for donning her ted hose, and for her ankle splint.  She was instructed in AE, using the long handle sponge for bathing. The pt was able to comb her hair and brush her teeth with s/u.  The pt  completed a toilet transfer with MinA using the grab bars for balance, she required ModA for cleaning her bottom and  was able to donned her diaper and pants with Mod/MaxA for clothing management. The pt returned to her w/c with MinA  and complete towel exercises in various planes to improve her activity tolerance, she was encouraged to include relaxation breathing to improve her compliance.  The pt  remained in her w/c with the bedside table within reach, as well as, her call light and safety alarm activated.  The pt indicated that she was comfortable with know addition needs at the time.   Therapy Documentation Precautions:  Precautions Precautions: Fall Precaution Comments: R hemiparesis Required Braces or Orthoses:  (Has R knee sleeve and R soft ankle brace) Restrictions Weight Bearing Restrictions: No General:   Vital Signs:  Pain: Pain Assessment Pain Scale: 0-10 Pain Score: 0-No pain ADL:  Vision   Perception    Praxis   Exercises:   Other Treatments:     Therapy/Group: Individual Therapy  Yvonne Kendall 08/10/2021, 12:31 PM

## 2021-08-10 NOTE — Plan of Care (Signed)
  Problem: RH Balance Goal: LTG Patient will maintain dynamic standing balance (PT) Description: LTG:  Patient will maintain dynamic standing balance with assistance during mobility activities (PT) Flowsheets (Taken 08/10/2021 0741) LTG: Pt will maintain dynamic standing balance during mobility activities with:: (upgraded due to improved endurance, strength, and balance) Supervision/Verbal cueing Note: upgraded due to improved endurance, strength, and balance   Problem: RH Ambulation Goal: LTG Patient will ambulate in controlled environment (PT) Description: LTG: Patient will ambulate in a controlled environment, # of feet with assistance (PT). Flowsheets (Taken 08/10/2021 0741) LTG: Pt will ambulate in controlled environ  assist needed:: (upgraded due to improved endurance, strength, and balance) Contact Guard/Touching assist LTG: Ambulation distance in controlled environment: 141ft with LRAD Goal: LTG Patient will ambulate in home environment (PT) Description: LTG: Patient will ambulate in home environment, # of feet with assistance (PT). Flowsheets (Taken 08/10/2021 0741) LTG: Pt will ambulate in home environ  assist needed:: (upgraded due to improved endurance, strength, and balance) Contact Guard/Touching assist LTG: Ambulation distance in home environment: 171ft with LRAD   Problem: RH Stairs Goal: LTG Patient will ambulate up and down stairs w/assist (PT) Description: LTG: Patient will ambulate up and down # of stairs with assistance (PT) Flowsheets (Taken 08/10/2021 0741) LTG: Pt will ambulate up/down stairs assist needed:: Minimal Assistance - Patient > 75% LTG: Pt will  ambulate up and down number of stairs: 1 step with LRAD

## 2021-08-11 DIAGNOSIS — I63512 Cerebral infarction due to unspecified occlusion or stenosis of left middle cerebral artery: Secondary | ICD-10-CM | POA: Diagnosis not present

## 2021-08-11 NOTE — Progress Notes (Signed)
PROGRESS NOTE   Subjective/Complaints: No new complaints.  Therapy notes reviewed. Performed exercises today, pericare with MinA for balance. Denies pain   ROS:  Pt denies SOB, abd pain, CP, N/V/C/D, pain, and vision changes  Objective:   No results found. No results for input(s): WBC, HGB, HCT, PLT in the last 72 hours.  No results for input(s): NA, K, CL, CO2, GLUCOSE, BUN, CREATININE, CALCIUM in the last 72 hours.   Intake/Output Summary (Last 24 hours) at 08/11/2021 1735 Last data filed at 08/11/2021 1300 Gross per 24 hour  Intake 840 ml  Output --  Net 840 ml        Physical Exam: Vital Signs Blood pressure 140/65, pulse 85, temperature 97.8 F (36.6 C), resp. rate 17, height 4\' 6"  (1.372 m), weight 66.1 kg, SpO2 100 %. Gen: no distress, normal appearing HEENT: oral mucosa pink and moist, NCAT Cardio: Reg rate Chest: normal effort, normal rate of breathing Abd: soft, non-distended Ext: no edema Psych: pleasant, normal affect Skin: intact Neurologic: Cranial nerves II through XII intact, motor strength is 5/5 in left and 4/5 RIght  deltoid, bicep, tricep, grip, hip flexor, knee extensors, ankle dorsiflexor and plantar flexor Sensory exam normal sensation to light touch and proprioception in left upper and lower extremities, absent on right side  Sensory ataxia  Right finger nose finger and RIght heel shin Musculoskeletal: Full range of motion in all 4 extremities. No joint swelling Ambulating in hallway with RW and [name]'s assist Pericare with MinA for balance.    Assessment/Plan: 1. Functional deficits which require 3+ hours per day of interdisciplinary therapy in a comprehensive inpatient rehab setting. Physiatrist is providing close team supervision and 24 hour management of active medical problems listed below. Physiatrist and rehab team continue to assess barriers to discharge/monitor patient  progress toward functional and medical goals  Care Tool:  Bathing    Body parts bathed by patient: Face   Body parts bathed by helper: Buttocks, Front perineal area     Bathing assist Assist Level: Set up assist     Upper Body Dressing/Undressing Upper body dressing   What is the patient wearing?: Pull over shirt    Upper body assist Assist Level: Set up assist    Lower Body Dressing/Undressing Lower body dressing      What is the patient wearing?: Incontinence brief, Pants     Lower body assist Assist for lower body dressing: Minimal Assistance - Patient > 75%     Toileting Toileting    Toileting assist Assist for toileting: Minimal Assistance - Patient > 75%     Transfers Chair/bed transfer  Transfers assist     Chair/bed transfer assist level: Contact Guard/Touching assist Chair/bed transfer assistive device:   Ambulation assist   Ambulation activity did not occur: Safety/medical concerns (fatigue, weakness, decreased balance/postural control)  Assist level: Contact Guard/Touching assist Assistive device: Walker-rolling Max distance: 123ft   Walk 10 feet activity   Assist  Walk 10 feet activity did not occur: Safety/medical concerns (fatigue, weakness, decreased balance/postural control)  Assist level: Contact Guard/Touching assist Assistive device: Walker-rolling   Walk 50 feet activity  Assist Walk 50 feet with 2 turns activity did not occur: Safety/medical concerns (fatigue, weakness, decreased balance/postural control)  Assist level: Contact Guard/Touching assist Assistive device: Walker-rolling    Walk 150 feet activity   Assist Walk 150 feet activity did not occur: Safety/medical concerns (fatigue, weakness, decreased balance/postural control)  Assist level: Contact Guard/Touching assist Assistive device: Walker-rolling    Walk 10 feet on uneven surface  activity   Assist Walk 10 feet on  uneven surfaces activity did not occur: Safety/medical concerns (fatigue, weakness, decreased balance/postural control)         Wheelchair     Assist Is the patient using a wheelchair?: Yes Type of Wheelchair: Manual    Wheelchair assist level: Supervision/Verbal cueing Max wheelchair distance: 149ft    Wheelchair 50 feet with 2 turns activity    Assist        Assist Level: Supervision/Verbal cueing   Wheelchair 150 feet activity     Assist      Assist Level: Supervision/Verbal cueing   Blood pressure 140/65, pulse 85, temperature 97.8 F (36.6 C), resp. rate 17, height 4\' 6"  (1.372 m), weight 66.1 kg, SpO2 100 %.    Medical Problem List and Plan: 1.  Right side weakness secondary to left lateral thalamic infarct likely small vessel disease             -patient may  shower             -ELOS/Goals: 14-16d  Continue CIR  Appreciate SW updating daughter 2.  Impaired mobility: d/c Lovenox since ambulating >160 feet             -antiplatelet therapy: Continue Aspirin 81 mg daily and Plavix 75 mg day x3 weeks then aspirin alone 3. Right lower extremity pain: continue Tylenol as needed. Right ankle brace ordered.  4. Insomnia: continue  Melatonin 10 mg nightly as needed sleep             -antipsychotic agents: N/A 5. Neuropsych: This patient is capable of making decisions on her own behalf. 6. Skin/Wound Care: Routine skin checks 7. Fluids/Electrolytes/Nutrition: Routine in and outs with follow-up chemistries 8.  Permissive hypertension.  Patient on losartan/HCTZ 100/25 daily prior to admission.  d/c losartan given soft DBP- keep off all BP meds for now 9.  Hyperlipidemia.  LDL 198. Continue Lipitor 10.  CKD stage III.  Baseline creatinine 1.52.  Cr reviewed and current better than baseline, monitor as needed.  11. Tachycardia: resolved, d/c coreg 12. Obesity BMI 35.99: change diet to heart healthy/carb modified.  13. Right knee buckling: knee sleeve  ordered 14. GERD without esophagitis: change protonix to PRN. 15. Constipation- resolved. Add magnesium gluconate 250mg  at night since still not regular.   16. Right ankle rolling: soft brace ordered  LOS: 18 days A FACE TO FACE EVALUATION WAS PERFORMED  Rinaldo Cloud P Mona Ayars 08/11/2021, 5:35 PM

## 2021-08-11 NOTE — Progress Notes (Signed)
Occupational Therapy Session Note  Patient Details  Name: Diane Webb MRN: [id number] Date of Birth: 05-05-1927  Today's Date: 08/11/2021 OT Individual Time: 1450-1535 OT Individual Time Calculation (min): 45 min    Short Term Goals: Week 2:  OT Short Term Goal 1 (Week 2): Pt will complete toilet transfer with mod A OT Short Term Goal 1 - Progress (Week 2): Met OT Short Term Goal 2 (Week 2): Pt will complete functional squat pivot transfer for prep for ADLs with min A OT Short Term Goal 2 - Progress (Week 2): Met OT Short Term Goal 3 (Week 2): Pt will complete TTB transfer with mod A OT Short Term Goal 3 - Progress (Week 2): Met Week 3:  OT Short Term Goal 1 (Week 3): STG = LTG due to ELOS  Skilled Therapeutic Interventions/Progress Updates:  Skilled OT intervention completed with focus on ADL retraining and BUE strengthening. Pt received seated in w/c, agreeable to session. Pt with request to go to bathroom. Sit > stand with CGA using RW, then ambulated with CGA to [facility] over toilet. Pt able to complete all pericare (void/BM) in stance using RW with min A for balance, then required min A for pulling up briefs/pants. While seated in w/c, pt completed the following BUE strengthening exercises to promote increased BUE endurance:  With 3 pound dowel- Bicep flexion 2x15  With 1 pound dowel- Chest presses 2x15 Forward rows x20 Backwards rows x20  Cues required for form and positioning, but R hand demonstrating good grasping ability vs losing grip/weakness. Pt requested to return to bed. Completed stand and stand pivot using RW and CGA to EOB. Able to doff pants with CGA for balance while in stance. Donned night gown with min A, and new brief with mod A, then doffed TEDs/ankle brace/shoes all with total A for time. Completed bed mobility with supervision. Pt left supine in bed, with bed alarm on, and all needs in reach at end of session.  Therapy Documentation Precautions:   Precautions Precautions: Fall Precaution Comments: R hemiparesis Required Braces or Orthoses:  (Has R knee sleeve and R soft ankle brace) Restrictions Weight Bearing Restrictions: No  Pain: No c/o pain   Therapy/Group: Individual Therapy  Joleah Kosak E Brodan Grewell 08/11/2021, 7:39 AM

## 2021-08-11 NOTE — Progress Notes (Signed)
Physical Therapy Session Note  Patient Details  Name: Diane Webb MRN: [id number] Date of Birth: Oct 03, 1926  Today's Date: 08/11/2021 PT Individual Time: 1100-1154 and 1400-1423 PT Individual Time Calculation (min): 54 min and 23 min  Short Term Goals: Week 2:  PT Short Term Goal 1 (Week 2): pt will transfer sit<>stand with LRAD and min A PT Short Term Goal 1 - Progress (Week 2): Met PT Short Term Goal 2 (Week 2): pt will transfer bed<>chair with LRAD and mod A consistantly PT Short Term Goal 2 - Progress (Week 2): Met PT Short Term Goal 3 (Week 2): pt will ambulate 41f with LRAD and mod A of 1 PT Short Term Goal 3 - Progress (Week 2): Met Week 3:  PT Short Term Goal 1 (Week 3): STG=LTG due to LOS  Skilled Therapeutic Interventions/Progress Updates:   Treatment Session 1 [facility] for family education training. Pt agreeable to PT treatment and denied any pain during session. Session with emphasis on discharge planning, functional mobility/transfers, generalized strengthening, dynamic standing balance/coordination, ambulation, and curb navigation. Pt [facility] for time management purposes. Ortho gym closed, therefore verbally discussed technique for car transfer and pt/daughter verbalized understanding. Pt navigated 1 4in curb x 2 trials with RW and min A to simulate home entry - trial 1 with therapist and trial 2 with daughter with 1 cue to ensure pt was as close to step as possible prior to lifting RW. Pt's daughter reported that pt could not enter house going forwards with RW, therefore practiced side stepping L/R x 54fin each direction with RW and CGA provided by pt's daughter to simulate home entry and bathroom setup - increased difficulty sidestepping R. Transported to dayroom and pt ambulated [id number] RW and CGA provided by pt's daughter - educated on importance of remaining on pt's R side (weak side) with all standing  activities in case of LOB. Discussed RW vs rollator and educated daughter/pt on recommendation to use RW upon D/C due to increased stability but pt could continue to work on progressing towards rollator with follow up PT. Also discussed HHPT vs OPPT and pt/daughter prefer to have HHPT for convenience - notified CSW. Pt/daughter with no further questions regarding mobility. Concluded session with pt sitting in WC, needs within reach, and chair pad alarm on.   Treatment Session 2 Received pt sitting in WC, pt agreeable to PT treatment, and denied any pain during session. Session with emphasis on functional mobility, generalized strengthening, dynamic standing balance/coordination, and gait training. Sit<>stand with RW and light min A x 3 trials throughout session and pt ambulated [id number], 55ft37f, and 62ft 12fwith RW and CGA with R soft ankle brace and rest breaks in between each trial. Pt demonstrates R knee valgus but improvements in foot clearance, stride length, and overall endurance. Pt [facility]. Concluded session with pt sitting in WC, needs within reach, and chair pad alarm on.   Therapy Documentation Precautions:  Precautions Precautions: Fall Precaution Comments: R hemiparesis Required Braces or Orthoses:  (Has R knee sleeve and R soft ankle brace) Restrictions Weight Bearing Restrictions: No  Therapy/Group: Individual Therapy Kalley Nicholl MAlfonse AlpersPT   08/11/2021, 7:30 AM

## 2021-08-11 NOTE — Progress Notes (Signed)
Occupational Therapy Session Note  Patient Details  Name: Diane Webb MRN: [id number] Date of Birth: [birth year]  Today's Date: 08/11/2021 OT Individual Time: 1005-1059 OT Individual Time Calculation (min): 54 min    Short Term Goals: Week 2:  OT Short Term Goal 1 (Week 2): Pt will complete toilet transfer with mod A OT Short Term Goal 1 - Progress (Week 2): Met OT Short Term Goal 2 (Week 2): Pt will complete functional squat pivot transfer for prep for ADLs with min A OT Short Term Goal 2 - Progress (Week 2): Met OT Short Term Goal 3 (Week 2): Pt will complete TTB transfer with mod A OT Short Term Goal 3 - Progress (Week 2): Met Week 3:  OT Short Term Goal 1 (Week 3): STG = LTG due to ELOS  Skilled Therapeutic Interventions/Progress Updates:  Skilled OT intervention completed with focus on family education with pt's daughter and son-in-law present. Pt received supine in bed, agreeable to session. Completed bed mobility with supervision. Donned TEDs/grip socks with total A for time, and pants with mod A for time. Set up for shirt. Sit > stand then stand pivot using RW and min A to w/c. Had pt's daughter donn ankle brace with assist needed for donning technique. Donned shoes with total A for time. Educated on AE pt could use at home for ADLs to increase/maintain independence and where to purchase. Pt transported in w/c with total A for time to mock-bathroom and apartment. Pt completed sit > stand with min A then ambulated couple steps using RW and CGA to TTB over tub/shower with min A for descent to seated position. Pt supervision for BLEs over tub threshold. Min A needed using RW to stand from TTB and then ambulation back to w/c. Steps repeated with pt's daughter practicing hands on, with cues needed for body positioning with pt and safety cues. Demonstrated safe assist with tub/shower transfer. Educated on method increasing pt's independence and level of assist needed by caregiver. Daughter  receptive. Transitioned to shower lip, and demonstrated shower threshold transfer using RW forward entrance and side stepping using RW and min-mod A needed for balance and mod cueing for positioning of RW and RLE. Discussed the difficulty with this method vs TTB method. Pt daughter receptive and in agreement about using tub/shower, despite her earlier desire of her mom using her own walk in shower. Discussed DME and where to purchase as well as talking to CSW about securing equipment. Discussed OPOT recommendation for further progress in RUE. Pt transported back to room in w/c, with total A for time, then left seated in w/c with family in room awaiting family ed with PT. All needs in reach and met and end of session.   Therapy Documentation Precautions:  Precautions Precautions: Fall Precaution Comments: R hemiparesis Required Braces or Orthoses:  (Has R knee sleeve and R soft ankle brace) Restrictions Weight Bearing Restrictions: No  Pain: No c/o pain during session   Therapy/Group: Individual Therapy  Maisyn Nouri E Shanah Guimaraes 08/11/2021, 7:38 AM

## 2021-08-11 NOTE — Progress Notes (Signed)
Occupational Therapy Discharge Summary  Patient Details  Name: Diane Webb MRN: [id number] Date of Birth: 30-Jul-1927   Patient has met 69 of 10 long term goals due to improved activity tolerance, improved balance, postural control, ability to compensate for deficits, functional use of  RIGHT upper extremity, improved attention, improved awareness, and improved coordination.  Patient to discharge at overall CGA-min A level.  Patient's care partner is independent to provide the necessary Supervision to min assist at discharge.    Reasons goals not met: n/a  Recommendation:  Patient will benefit from ongoing skilled OT services in outpatient setting to continue to advance functional skills in the area of BADL, iADL, and Reduce care partner burden.  Equipment: TTB  Reasons for discharge: treatment goals met  Patient/family agrees with progress made and goals achieved: Yes  OT Discharge Precautions/Restrictions  Precautions Precautions: Fall Precaution Comments: R hemiparesis Required Braces or Orthoses:  (has R knee sleeve and R ankle brace) Restrictions Weight Bearing Restrictions: No ADL ADL Equipment Provided: Long-handled sponge Eating: Set up Where Assessed-Eating: Wheelchair Grooming: Setup Where Assessed-Grooming: Sitting at sink Upper Body Bathing: Supervision/safety Where Assessed-Upper Body Bathing: Shower Lower Body Bathing: Supervision/safety Where Assessed-Lower Body Bathing: Shower Upper Body Dressing: Supervision/safety Where Assessed-Upper Body Dressing: Edge of bed Lower Body Dressing: Minimal assistance Where Assessed-Lower Body Dressing: Edge of bed Toileting: Minimal assistance Where Assessed-Toileting: Bedside Commode Toilet Transfer: Therapist, music Method: Stand pivot (RW) Science writer: Radiographer, therapeutic: Close supervison Clinical cytogeneticist Method: Stand pivot, Sit pivot Tub/Shower Equipment: Probation officer, Energy manager: Moderate assistance Social research officer, government Method: Print production planner with back ADL Comments: Pt able to complete UB bathing/dressing with min A due to BUE limited AROM and R >L sided weakness and decreased cooridination, presents with R knee buckling during transfers using RW and able to complete LB bathing/dressing at mod A Vision Baseline Vision/History: 1 Wears glasses (has had cataracts removed, reports not wearing glasses but has some at home) Patient Visual Report: No change from baseline Vision Assessment?: Yes Eye Alignment: Within Functional Limits Ocular Range of Motion: Within Functional Limits Alignment/Gaze Preference: Within Defined Limits Tracking/Visual Pursuits: Decreased smoothness of eye movement to RIGHT inferior field;Decreased smoothness of eye movement to LEFT inferior field;Decreased smoothness of vertical tracking;Decreased smoothness of horizontal tracking Saccades: Overshoots Visual Fields: No apparent deficits Additional Comments: Demonstrates decreased smoothness in all fields, but is [facility] for ADL tasks Perception  Perception: Within Functional Limits Praxis Praxis: Intact Praxis-Other Comments: Requires increased time with RUE and has decreased precision, but is [facility] for self-care tasks and has improved since eval Cognition Overall Cognitive Status: Within Functional Limits for tasks assessed Arousal/Alertness: Awake/alert Orientation Level: Oriented X4 Year: 2021 Month: November Day of Week: Correct Memory: Appears intact Immediate Memory Recall: Sock;Blue;Bed Memory Recall Sock: Without Cue Memory Recall Blue: Without Cue Memory Recall Bed: Without Cue Awareness: Appears intact Problem Solving: Impaired (but improved since eval) Executive Function: Sequencing Sequencing: Impaired (impaired but improved since eval) Safety/Judgment: Appears intact Sensation Sensation Light  Touch: Impaired by gross assessment Hot/Cold: Appears Intact Proprioception: Impaired by gross assessment Additional Comments: pt demonstrates decreased response to light touch on RUE on distal portions, proprioception has improved since eval however still impaired but relatively WFL for self-care tasks performed Coordination Gross Motor Movements are Fluid and Coordinated: No Fine Motor Movements are Fluid and Coordinated: No Coordination and Movement Description: R hemiparesis, generalized weakness/deconditioning Finger Nose Finger Test: dysmetria  on RUE, limited shoulder ROM on LUE due to arthritis Motor  Motor Motor: Hemiplegia Motor - Skilled Clinical Observations: R hemiparesis, generalized weakness/deconditioning Mobility  Bed Mobility Rolling Right: Independent with assistive device Right Sidelying to Sit: Independent with assistive device Supine to Sit: Independent with assistive device Sitting - Scoot to Edge of Bed: Independent with assistive device Transfers Sit to Stand: Contact Guard/Touching assist Stand to Sit: Contact Guard/Touching assist  Trunk/Postural Assessment  Cervical Assessment Cervical Assessment: Exceptions to [facility] (forward head) Thoracic Assessment Thoracic Assessment: Within Functional Limits (kyphosis) Lumbar Assessment Lumbar Assessment: Exceptions to [facility] (posterior pelvic tilt) Postural Control Postural Control: Within Functional Limits  Balance Balance Balance Assessed: Yes Static Sitting Balance Static Sitting - Balance Support: Feet supported;No upper extremity supported Static Sitting - Level of Assistance: 6: Modified independent (Device/Increase time) Dynamic Sitting Balance Dynamic Sitting - Balance Support: Feet supported;No upper extremity supported Dynamic Sitting - Level of Assistance: 5: Stand by assistance (supervision) Static Standing Balance Static Standing - Balance Support: Bilateral upper extremity supported;During functional  activity Static Standing - Level of Assistance: 5: Stand by assistance (supervision) Dynamic Standing Balance Dynamic Standing - Balance Support: Right upper extremity supported Dynamic Standing - Level of Assistance: 5: Stand by assistance (close supervision) Extremity/Trunk Assessment RUE Assessment RUE Assessment: Exceptions to [facility] Active Range of Motion (AROM) Comments: AROM limited at baseline due to arthritis in shoulder General Strength Comments: Grossly 3+/5, however WFL for most ADL tasks LUE Assessment LUE Assessment: Exceptions to [facility] Active Range of Motion (AROM) Comments: AROM limited at baseline due to arthritis General Strength Comments: 4-/5 grossly, WFL for self-care tasks   Meade Hogeland E Sher Shampine 08/11/2021, 4:00 PM

## 2021-08-12 DIAGNOSIS — I63512 Cerebral infarction due to unspecified occlusion or stenosis of left middle cerebral artery: Secondary | ICD-10-CM | POA: Diagnosis not present

## 2021-08-12 NOTE — Progress Notes (Signed)
PROGRESS NOTE   Subjective/Complaints:  Pt reports doing well- LBM this AM.   ROS:   Pt denies SOB, abd pain, CP, N/V/C/D, and vision changes  Objective:   No results found. No results for input(s): WBC, HGB, HCT, PLT in the last 72 hours.  No results for input(s): NA, K, CL, CO2, GLUCOSE, BUN, CREATININE, CALCIUM in the last 72 hours.   Intake/Output Summary (Last 24 hours) at 08/12/2021 1209 Last data filed at 08/12/2021 0807 Gross per 24 hour  Intake 600 ml  Output --  Net 600 ml        Physical Exam: Vital Signs Blood pressure (!) 141/70, pulse 89, temperature 98.4 F (36.9 C), resp. rate 20, height 4\' 6"  (1.372 m), weight 66.1 kg, SpO2 95 %.    General: awake, alert, appropriate, sitting up in bed; finished breakfast;  NAD HENT: conjugate gaze; oropharynx moist CV: regular rate; no JVD Pulmonary: CTA B/L; no W/R/R- good air movement GI: soft, NT, ND, (+)BS Psychiatric: appropriate- flat but appropriate Neurological: alert  Skin: intact Neurologic: Cranial nerves II through XII intact, motor strength is 5/5 in left and 4/5 RIght  deltoid, bicep, tricep, grip, hip flexor, knee extensors, ankle dorsiflexor and plantar flexor Sensory exam normal sensation to light touch and proprioception in left upper and lower extremities, absent on right side  Sensory ataxia  Right finger nose finger and RIght heel shin Musculoskeletal: Full range of motion in all 4 extremities. No joint swelling Ambulating in hallway with RW and [name]'s assist Pericare with MinA for balance.    Assessment/Plan: 1. Functional deficits which require 3+ hours per day of interdisciplinary therapy in a comprehensive inpatient rehab setting. Physiatrist is providing close team supervision and 24 hour management of active medical problems listed below. Physiatrist and rehab team continue to assess barriers to discharge/monitor patient  progress toward functional and medical goals  Care Tool:  Bathing    Body parts bathed by patient: Face   Body parts bathed by helper: Buttocks, Front perineal area     Bathing assist Assist Level: Set up assist     Upper Body Dressing/Undressing Upper body dressing   What is the patient wearing?: Pull over shirt    Upper body assist Assist Level: Set up assist    Lower Body Dressing/Undressing Lower body dressing      What is the patient wearing?: Incontinence brief, Pants     Lower body assist Assist for lower body dressing: Minimal Assistance - Patient > 75%     Toileting Toileting    Toileting assist Assist for toileting: Minimal Assistance - Patient > 75%     Transfers Chair/bed transfer  Transfers assist     Chair/bed transfer assist level: Contact Guard/Touching assist Chair/bed transfer assistive device:   Ambulation assist   Ambulation activity did not occur: Safety/medical concerns (fatigue, weakness, decreased balance/postural control)  Assist level: Contact Guard/Touching assist Assistive device: Walker-rolling Max distance: 157ft   Walk 10 feet activity   Assist  Walk 10 feet activity did not occur: Safety/medical concerns (fatigue, weakness, decreased balance/postural control)  Assist level: Contact Guard/Touching assist Assistive device: Walker-rolling  Walk 50 feet activity   Assist Walk 50 feet with 2 turns activity did not occur: Safety/medical concerns (fatigue, weakness, decreased balance/postural control)  Assist level: Contact Guard/Touching assist Assistive device: Walker-rolling    Walk 150 feet activity   Assist Walk 150 feet activity did not occur: Safety/medical concerns (fatigue, weakness, decreased balance/postural control)  Assist level: Contact Guard/Touching assist Assistive device: Walker-rolling    Walk 10 feet on uneven surface  activity   Assist Walk 10 feet on  uneven surfaces activity did not occur: Safety/medical concerns (fatigue, weakness, decreased balance/postural control)         Wheelchair     Assist Is the patient using a wheelchair?: Yes Type of Wheelchair: Manual    Wheelchair assist level: Supervision/Verbal cueing Max wheelchair distance: 142ft    Wheelchair 50 feet with 2 turns activity    Assist        Assist Level: Supervision/Verbal cueing   Wheelchair 150 feet activity     Assist      Assist Level: Supervision/Verbal cueing   Blood pressure (!) 141/70, pulse 89, temperature 98.4 F (36.9 C), resp. rate 20, height 4\' 6"  (1.372 m), weight 66.1 kg, SpO2 95 %.    Medical Problem List and Plan: 1.  Right side weakness secondary to left lateral thalamic infarct likely small vessel disease             -patient may  shower             -ELOS/Goals: 14-16d  Con't CIR  Appreciate SW updating daughter 2.  Impaired mobility: d/c Lovenox since ambulating >160 feet             -antiplatelet therapy: Continue Aspirin 81 mg daily and Plavix 75 mg day x3 weeks then aspirin alone 3. Right lower extremity pain: continue Tylenol as needed. Right ankle brace ordered.   11/12- denies pain- con't regimen 4. Insomnia: continue  Melatonin 10 mg nightly as needed sleep             -antipsychotic agents: N/A 5. Neuropsych: This patient is capable of making decisions on her own behalf. 6. Skin/Wound Care: Routine skin checks 7. Fluids/Electrolytes/Nutrition: Routine in and outs with follow-up chemistries 8.  Permissive hypertension.  Patient on losartan/HCTZ 100/25 daily prior to admission.  d/c losartan given soft DBP- keep off all BP meds for now  11/12- BP 146/80s- off BP meds- will be per Dr 13/12 unless higher than 160 systolic. 9.  Hyperlipidemia.  LDL 198. Continue Lipitor 10.  CKD stage III.  Baseline creatinine 1.52.  Cr reviewed and current better than baseline, monitor as needed.  11. Tachycardia:  resolved, d/c coreg 12. Obesity BMI 35.99: change diet to heart healthy/carb modified.  13. Right knee buckling: knee sleeve ordered 14. GERD without esophagitis: change protonix to PRN. 15. Constipation- resolved. Add magnesium gluconate 250mg  at night since still not regular.   16. Right ankle rolling: soft brace ordered  LOS: 19 days A FACE TO FACE EVALUATION WAS PERFORMED  Diane Webb 08/12/2021, 12:09 PM

## 2021-08-12 NOTE — Progress Notes (Signed)
Occupational Therapy Session Note  Patient Details  Name: Diane Webb MRN: [id number] Date of Birth: 10-12-1926  Today's Date: 08/12/2021 OT Individual Time: 1100-1200 OT Individual Time Calculation (min): 60 min    Short Term Goals: Week 1:  OT Short Term Goal 1 (Week 1): Pt will perform sit <> stand with mod A in prep for ADLs OT Short Term Goal 1 - Progress (Week 1): Met OT Short Term Goal 2 (Week 1): Pt will complete LB dressing with min A OT Short Term Goal 2 - Progress (Week 1): Met OT Short Term Goal 3 (Week 1): Pt will complete toilet transfer with mod A OT Short Term Goal 3 - Progress (Week 1): Progressing toward goal Week 2:  OT Short Term Goal 1 (Week 2): Pt will complete toilet transfer with mod A OT Short Term Goal 1 - Progress (Week 2): Met OT Short Term Goal 2 (Week 2): Pt will complete functional squat pivot transfer for prep for ADLs with min A OT Short Term Goal 2 - Progress (Week 2): Met OT Short Term Goal 3 (Week 2): Pt will complete TTB transfer with mod A OT Short Term Goal 3 - Progress (Week 2): Met Week 3:  OT Short Term Goal 1 (Week 3): STG = LTG due to ELOS  Skilled Therapeutic Interventions/Progress Updates:    1:1 Pt received in the bed. Pt transitioned to sitting EOB mod I . At the EOB pt participated in bathing EOB with setup of supplies and clothes. Pt reported the brief is uncomfortable and usually wears underwear with a pad. Offered her some of the hospital underwear and a pad to try. Pt reported this was more comfortable. Pt opted to not wash periarea/ buttocks - reported she had done it earlier. Pt was most successful with threading underwear and pants with crossing her Le. Pt able to dress UB and LB with supervision. However pt does require A to don TEDS and right foot orthotic (ankle brace). Pt does report her daughter can assist with donning the brace at home. Pt ambulated to the sink with close supervision with RW and stood at sink to wash and put in  her dentures. After a seated rest break. Pt ambulated ~113 feet on the way to the gym demonstrating safety use of RW and improved right LE control. Propelled with bilateral feet to little gym for continued coordination of LEs. Performed 5 min on the Nustep with only the use of her LEs on resistance level 3. Pt reported it was somewhat harder on BORG. Returned to room and left sitting up int he w/c in prep for lunch.   Therapy Documentation Precautions:  Precautions Precautions: Fall Precaution Comments: R hemiparesis Required Braces or Orthoses:  (has R knee sleeve and R ankle brace) Restrictions Weight Bearing Restrictions: No General:   Vital Signs: Therapy Vitals Temp: 98.3 F (36.8 C) Pulse Rate: 82 Resp: 18 BP: 140/65 Patient Position (if appropriate): Lying Oxygen Therapy SpO2: 96 % O2 Device: Room Air Pain:    Therapy/Group: Individual Therapy  Willeen Cass [facility] 08/12/2021, 2:42 PM

## 2021-08-13 DIAGNOSIS — I10 Essential (primary) hypertension: Secondary | ICD-10-CM

## 2021-08-13 DIAGNOSIS — R4701 Aphasia: Secondary | ICD-10-CM

## 2021-08-13 DIAGNOSIS — I63512 Cerebral infarction due to unspecified occlusion or stenosis of left middle cerebral artery: Secondary | ICD-10-CM | POA: Diagnosis not present

## 2021-08-13 NOTE — Discharge Summary (Signed)
Physician Discharge Summary  Patient ID: Daveena Elmore MRN: [id number] DOB/AGE: [birth year] 87 y.o.  Admit date: 07/24/2021 Discharge date: 08/15/2021  Discharge Diagnoses:  Principal Problem:   Acute stroke due to occlusion of left middle cerebral artery (HCC) Active Problems:   Left thalamic infarction (HCC) DVT prophylaxis Permissive hypertension Hyperlipidemia CKD stage III Tachycardia Obesity GERD Constipation  Discharged Condition: Stable  Significant Diagnostic Studies: MR ANGIO HEAD WO CONTRAST  Result Date: 07/20/2021 CLINICAL DATA:  87 year old female with neurologic deficit. Acute left thalamic lacunar infarct on MRI. EXAM: MRA HEAD WITHOUT CONTRAST TECHNIQUE: Angiographic images of the Circle of Willis were acquired using MRA technique without intravenous contrast. COMPARISON:  Brain MRI 0133 hours today. Neck MRA today reported separately. FINDINGS: No intracranial mass effect or ventriculomegaly. Antegrade flow in the posterior circulation with patent distal vertebral arteries. The left is mildly dominant. No distal vertebral or vertebrobasilar junction stenosis. Bilateral AICA appear to be patent and may be dominant. Patent basilar artery with mild tortuosity. Patent SCA and PCA origins. Both posterior communicating arteries are present with fetal type bilateral PCA origins. Left PCA branches are within normal limits. There is a moderate stenosis of the right PCA P2 (series 1013, image 14). Distal right PCA branches are within normal limits. Antegrade flow in both ICA siphons. Mild siphon irregularity without stenosis. Bilateral ophthalmic and posterior communicating artery origins appear normal. Patent carotid termini, MCA and ACA origins. Diminutive or absent anterior communicating artery. Tortuous proximal right ACA A2 segment, with artifact felt to explain the apparent flow gap there. Left MCA M1 is patent with mild irregularity but no significant stenosis. Left MCA  bifurcation and visible MCA branches are within normal limits. Right MCA M1 is patent with mild irregularity and stenosis proximal to the bifurcation (series 1003, image 3). Right MCA bifurcation is patent. But there is a severe stenosis of the dominant posterior right MCA M2 branch leading up to a bifurcation (series 1007, image 15 and series 5, image 135). Other visible right MCA branches are within normal limits. IMPRESSION: Negative for large vessel occlusion, but positive for intracranial branch atherosclerosis: Severe stenosis of a posterior Right MCA M2 branch. Mild stenosis of the Right MCA M1 segment. Moderate stenosis of the right PCA P2 segment. Electronically Signed   By: Odessa Fleming M.D.   On: 07/20/2021 07:28   MR ANGIO NECK WO CONTRAST  Result Date: 07/20/2021 CLINICAL DATA:  87 year old female with neurologic deficit. Acute left thalamic lacunar infarct on MRI. EXAM: MRA NECK WITHOUT CONTRAST TECHNIQUE: Angiographic images of the neck were acquired using MRA technique without intravenous contrast. Carotid stenosis measurements (when applicable) are obtained utilizing NASCET criteria, using the distal internal carotid diameter as the denominator. COMPARISON:  Brain MRI 0137 hours today. Intracranial MRA reported separately. FINDINGS: Two and 3D time-of-flight neck MRA. Antegrade flow in the bilateral cervical carotid and vertebral arteries. Evidence of a 3 vessel arch configuration. Bilateral carotids are moderately tortuous. Carotid bifurcations appear within normal limits. No evidence of hemodynamically significant carotid stenosis in the neck. Left vertebral artery appears mildly dominant and is tortuous. Antegrade flow in both vertebral arteries to the skull base without evidence of hemodynamically significant stenosis. IMPRESSION: Tortuous cervical carotid and vertebral arteries with no evidence of hemodynamically significant stenosis. Electronically Signed   By: Odessa Fleming M.D.   On: 07/20/2021  07:23   MR BRAIN WO CONTRAST  Result Date: 07/20/2021 CLINICAL DATA:  Neuro deficit, stroke suspected EXAM: MRI HEAD WITHOUT CONTRAST TECHNIQUE: Multiplanar, multiecho  pulse sequences of the brain and surrounding structures were obtained without intravenous contrast. COMPARISON:  No prior MRI, correlation is made with CT head 07/19/2021. FINDINGS: Brain: Restricted diffusion in the left lateral thalamus, which correlates with the hypodensity seen on the 07/19/2021 CT head. No other foci of restricted diffusion. No acute hemorrhage. Nose hemosiderin deposition to suggest remote hemorrhage. No hydrocephalus, extra-axial collection, mass, mass effect, or midline shift. Confluent T2 hyperintense signal in the periventricular white matter and pons, likely the sequela of severe chronic small vessel ischemic disease. Vascular: Normal flow voids. Skull and upper cervical spine: Normal marrow signal. Sinuses/Orbits: Negative.  Status post bilateral lens replacements. Other: The mastoids are well aerated. IMPRESSION: Acute infarct of the left lateral thalamus, which correlates with the hypodensity seen on the same-day CT head. Imaging results were communicated on 07/20/2021 at 2:31 am to provider [facility] via secure text paging. Electronically Signed   By: Wiliam Ke M.D.   On: 07/20/2021 02:33   ECHOCARDIOGRAM COMPLETE  Result Date: 07/20/2021    ECHOCARDIOGRAM REPORT   Patient Name:   GIRLIE VELTRI Date of Exam: 07/20/2021 Medical Rec #:  [id number]     Height: Accession #:    [id number]    Weight: Date of Birth:  [birth year]     BSA: Patient Age:    89 years      BP:           132/99 mmHg Patient Gender: F             HR:           90 bpm. Exam Location:  Inpatient Procedure: 2D Echo, Cardiac Doppler and Color Doppler Indications:    CVA  History:        Patient has no prior history of Echocardiogram examinations.                 Signs/Symptoms:CKD; Risk Factors:Hypertension, Dyslipidemia and                  Obesity.  Sonographer:    Lavenia Atlas RDCS Referring Phys: [id number] Deno Lunger [facility] IMPRESSIONS  1. Left ventricular ejection fraction, by estimation, is 60 to 65%. The left ventricle has normal function. The left ventricle has no regional wall motion abnormalities. There is mild left ventricular hypertrophy. Left ventricular diastolic parameters are consistent with Grade I diastolic dysfunction (impaired relaxation).  2. Right ventricular systolic function is normal. The right ventricular size is normal. There is normal pulmonary artery systolic pressure.  3. Left atrial size was mildly dilated.  4. The mitral valve is normal in structure. No evidence of mitral valve regurgitation. No evidence of mitral stenosis.  5. The aortic valve is tricuspid. There is mild calcification of the aortic valve. Aortic valve regurgitation is not visualized. No aortic stenosis is present.  6. The inferior vena cava is normal in size with greater than 50% respiratory variability, suggesting right atrial pressure of 3 mmHg. FINDINGS  Left Ventricle: Left ventricular ejection fraction, by estimation, is 60 to 65%. The left ventricle has normal function. The left ventricle has no regional wall motion abnormalities. The left ventricular internal cavity size was normal in size. There is  mild left ventricular hypertrophy. Left ventricular diastolic parameters are consistent with Grade I diastolic dysfunction (impaired relaxation). Right Ventricle: The right ventricular size is normal. No increase in right ventricular wall thickness. Right ventricular systolic function is normal. There is normal pulmonary artery systolic pressure. The tricuspid regurgitant velocity is 2.74  m/s, and  with an assumed right atrial pressure of 3 mmHg, the estimated right ventricular systolic pressure is 33.0 mmHg. Left Atrium: Left atrial size was mildly dilated. Right Atrium: Right atrial size was normal in size. Pericardium: There is no evidence of  pericardial effusion. Mitral Valve: The mitral valve is normal in structure. No evidence of mitral valve regurgitation. No evidence of mitral valve stenosis. Tricuspid Valve: The tricuspid valve is normal in structure. Tricuspid valve regurgitation is trivial. No evidence of tricuspid stenosis. Aortic Valve: The aortic valve is tricuspid. There is mild calcification of the aortic valve. Aortic valve regurgitation is not visualized. No aortic stenosis is present. Pulmonic Valve: The pulmonic valve was normal in structure. Pulmonic valve regurgitation is not visualized. No evidence of pulmonic stenosis. Aorta: The aortic root is normal in size and structure. Venous: The inferior vena cava is normal in size with greater than 50% respiratory variability, suggesting right atrial pressure of 3 mmHg. IAS/Shunts: No atrial level shunt detected by color flow Doppler.  LEFT VENTRICLE PLAX 2D LVIDd:         3.95 cm     Diastology LVIDs:         2.60 cm     LV e' medial:    4.90 cm/s LV PW:         1.25 cm     LV E/e' medial:  10.7 LV IVS:        1.10 cm     LV e' lateral:   5.98 cm/s LVOT diam:     2.30 cm     LV E/e' lateral: 8.7 LV SV:         88 LVOT Area:     4.15 cm  LV Volumes (MOD) LV vol d, MOD A4C: 67.6 ml LV vol s, MOD A4C: 29.2 ml LV SV MOD A4C:     67.6 ml RIGHT VENTRICLE RV Basal diam:  2.80 cm RV S prime:     7.46 cm/s TAPSE (M-mode): 2.2 cm LEFT ATRIUM             RIGHT ATRIUM LA diam:        3.30 cm RA Area:     11.30 cm LA Vol (A2C):   48.7 ml RA Volume:   24.70 ml LA Vol (A4C):   25.9 ml LA Biplane Vol: 36.9 ml  AORTIC VALVE LVOT Vmax:   95.30 cm/s LVOT Vmean:  66.200 cm/s LVOT VTI:    0.211 m  AORTA Ao Root diam: 3.10 cm MITRAL VALVE               TRICUSPID VALVE MV Area (PHT): 3.50 cm    TR Peak grad:   30.0 mmHg MV Decel Time: 217 msec    TR Vmax:        274.00 cm/s MV E velocity: 52.30 cm/s MV A velocity: 77.60 cm/s  SHUNTS MV E/A ratio:  0.67        Systemic VTI:  0.21 m                             Systemic Diam: 2.30 cm Arvilla Meres MD Electronically signed by Arvilla Meres MD Signature Date/Time: 07/20/2021/8:31:16 PM    Final    CT HEAD CODE STROKE WO CONTRAST  Result Date: 07/19/2021 CLINICAL DATA:  Code stroke. EXAM: CT HEAD WITHOUT CONTRAST TECHNIQUE: Contiguous axial images were obtained from the base of the skull through  the vertex without intravenous contrast. COMPARISON:  None. FINDINGS: Brain: Hypodensity in the left lateral thalamus, of indeterminate acuity, possibly acute or subacute infarct. No acute hemorrhage, hydrocephalus, extra-axial collection, mass, mass effect, or midline shift. Periventricular white matter changes, likely the sequela of chronic small vessel ischemic disease. Vascular: No hyperdense vessel. Skull: Normal. Negative for fracture or focal lesion. Sinuses/Orbits: No acute finding. Other: The mastoids are well aerated. ASPECTS [facility] Stroke Program Early CT Score) - Ganglionic level infarction (caudate, lentiform nuclei, internal capsule, insula, M1-M3 cortex): 7 - Supraganglionic infarction (M4-M6 cortex): 3 Total score (0-10 with 10 being normal): 9 IMPRESSION: 1. Hypodensity in the left lateral thalamus, of indeterminate acuity, but possibly representing an acute or subacute infarct. 2. ASPECTS is 10 Code stroke imaging results were communicated on 07/19/2021 at 11:20 pm to provider [facility] via secure text paging. Electronically Signed   By: Wiliam Ke M.D.   On: 07/19/2021 23:21    Labs:  Basic Metabolic Panel: No results for input(s): NA, K, CL, CO2, GLUCOSE, BUN, CREATININE, CALCIUM, MG, PHOS in the last 168 hours.  CBC: No results for input(s): WBC, NEUTROABS, HGB, HCT, MCV, PLT in the last 168 hours.  CBG: No results for input(s): GLUCAP in the last 168 hours.  Family history.  Positive for hypertension.  Denies any CVA colon cancer or esophageal cancer  Brief HPI:   Breonia Kirstein is a 87 y.o. right-handed female with history of CKD stage  III hypertension hyperlipidemia.  Patient lives with her children independent with assistive device.  Presented 07/19/2021 with acute onset of right side weakness and numbness.  CT/MRI showed acute infarct of the left lateral thalamus.  CT angiogram head and neck negative for large vessel occlusion.  Patient did not receive tPA.  Echocardiogram with ejection fraction of 60 to 65% no wall motion abnormalities grade 1 diastolic dysfunction.  Admission chemistries unremarkable except BUN 24 creatinine 1.52 urine drug screen negative.  Maintained on aspirin and Plavix for CVA prophylaxis x3 weeks and aspirin alone.  Subcutaneous Lovenox for DVT prophylaxis.  Therapy evaluations completed due to patient's right side numbness and decreased functional mobility was admitted for a comprehensive rehab program.   Hospital Course: Diarra Ceja was admitted to rehab 07/24/2021 for inpatient therapies to consist of PT, ST and OT at least three hours five days a week. Past admission physiatrist, therapy team and rehab RN have worked together to provide customized collaborative inpatient rehab.  Pertaining to patient's left lateral thalamic infarction remained stable aspirin Plavix x3 weeks and aspirin alone.  Initially on Lovenox for DVT prophylaxis discontinued ambulating greater than 160 feet.  Noted bouts of insomnia melatonin as needed.  Permissive hypertension patient had been on losartan/HCTZ 100/25 prior to admission losartan discontinued due to soft BP and plan to keep off antihypertensive medications unless higher than 160 systolic.  Lipitor ongoing for hyperlipidemia.  CKD stage III baseline creatinine 1.52 and monitored.  Noted obesity BMI 35.99 dietary follow-up.  Protonix as needed for GERD.  Bouts of constipation resolved with laxative assistance.   Blood pressures were monitored on TID basis and soft and monitored     Rehab course: During patient's stay in rehab weekly team conferences were held to  monitor patient's progress, set goals and discuss barriers to discharge. At admission, patient required minimal guard supine to sit sit to supine mod max assist 5 feet rolling walker  Physical exam.  Blood pressure 135/53 pulse 68 temperature 97.6 respirations 18 oxygen saturations 95% room  air Constitutional.  No acute distress HEENT Head.  Normocephalic and atraumatic Eyes.  Pupils round and reactive to light no discharge nystagmus Neck.  Supple nontender no JVD without thyromegaly Cardiac regular rate rhythm not extra sounds or murmur heard Abdomen.  Soft nontender positive bowel sounds without rebound Respiratory effort normal no respiratory distress without wheeze Skin.  Warm and dry Neurologic.  Cranial nerves II through XII intact motor strength 5/5 in left and 4/5 right deltoid bicep tricep grip hip flexor knee extensors ankle dorsi plantar flexor.  Sensation intact.   He/She  has had improvement in activity tolerance, balance, postural control as well as ability to compensate for deficits. He/She has had improvement in functional use RUE/LUE  and RLE/LLE as well as improvement in awareness.  Navigated 4 inch curb rolling walker minimal assist.  Ambulates 180 feet rolling walker contact-guard.  Emphasis placed on functional ability generalized strengthening dynamic standing balance and coordination.  Participated in bathing edge of bed with set up supplies and close.  She could put on her undergarments and pants with supervision.  Full family teaching completed plan discharged to home       Disposition: Discharged home    Diet: Regular  Special Instructions: No driving smoking or alcohol  Medications at discharge 1.  Tylenol as needed 2.  Aspirin 81 mg p.o. daily 3.  Magnesium gluconate 250 mg p.o. nightly 4.  Protonix 40 mg daily as needed 5.  MiraLAX daily as needed  30-35 minutes were spent completing discharge summary and discharge planning  Discharge Instructions      Ambulatory referral to Neurology   Complete by: As directed    An appointment is requested in approximately: 4 weeks left lateral thalamic infarction   Ambulatory referral to Physical Medicine Rehab   Complete by: As directed    Moderate complexity follow-up 1 to 2 weeks left lateral thalamic infarction        Follow-up Information     Raulkar, Drema Pry, MD Follow up.   Specialty: Physical Medicine and Rehabilitation Why: 09/29/21 please arrive at 11:00am for 11:20am follow-up appointment, thank you! Contact information: [address] [place] [address] [phone]                 Signed: Charlton Amor 08/15/2021, 5:17 AM

## 2021-08-13 NOTE — Progress Notes (Addendum)
Physical Therapy Session Note  Patient Details  Name: Diane Webb MRN: [id number] Date of Birth: 11-25-1926  Today's Date: 08/13/2021 PT Individual Time: 0900-1000 PT Individual Time Calculation (min): 60 min   Short Term Goals: Week 3:  PT Short Term Goal 1 (Week 3): STG=LTG due to LOS  Skilled Therapeutic Interventions/Progress Updates:   Pt presents supine in bed and agreeable to therapy.  Pt transferred sup to sit w/ supervision and scoots to EOB.  Pt given washcloth to wash face . Pt states need to practivce transfers.   Pt required total A to don TEDS and R ankle brace in sitting w/o LOB.  Pt able to pull pants up to hips once threaded over feet.  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  Pt required close supervision from elevated bed to simulate personal bed.  Pt requires verbal cues for breathing techniques during strenuous activity.  Pt required CGA to completed pulling up pants.  Pt amb 120' w/ RW and CGA to Close supervision.  Pt requires verbal cues for posture, but good swing-through RLE.  Pt amb around bed to couch.  Verbal cues for sequencing including scooting forward and breathing technique, and required heavy min A.  Pt amb 80' including turns to return to w/c.  Pt remained sitting in w/c w/ seat alarm on and all needs in reach.      Therapy Documentation Precautions:  Precautions Precautions: Fall Precaution Comments: R hemiparesis Required Braces or Orthoses:  (has R knee sleeve and R ankle brace) Restrictions Weight Bearing Restrictions: No General:   Vital Signs:  Pain:0/10 Pain Assessment Pain Scale: 0-10 Pain Score: 0-No pain Mobility:   :      Therapy/Group: Individual Therapy  Lucio Edward 08/13/2021, 10:00 AM

## 2021-08-13 NOTE — Progress Notes (Signed)
PROGRESS NOTE   Subjective/Complaints:  Pt up in bed eating her breakfast. No complaints. Seems to be in good spirits  ROS: Patient denies fever, rash, sore throat, blurred vision, nausea, vomiting, diarrhea, cough, shortness of breath or chest pain, joint or back pain, headache, or mood change.   Objective:   No results found. No results for input(s): WBC, HGB, HCT, PLT in the last 72 hours.  No results for input(s): NA, K, CL, CO2, GLUCOSE, BUN, CREATININE, CALCIUM in the last 72 hours.   Intake/Output Summary (Last 24 hours) at 08/13/2021 0941 Last data filed at 08/12/2021 1851 Gross per 24 hour  Intake 840 ml  Output --  Net 840 ml        Physical Exam: Vital Signs Blood pressure (!) 130/59, pulse 80, temperature 98.2 F (36.8 C), temperature source Oral, resp. rate 16, height 4\' 6"  (1.372 m), weight 66.1 kg, SpO2 96 %.    Constitutional: No distress . Vital signs reviewed. HEENT: NCAT, EOMI, oral membranes moist Neck: supple Cardiovascular: RRR without murmur. No JVD    Respiratory/Chest: CTA Bilaterally without wheezes or rales. Normal effort    GI/Abdomen: BS +, non-tender, non-distended Ext: no clubbing, cyanosis, or edema Psych: pleasant and cooperative  Skin: intact Neurologic: Communicated fairly accurately with yes/no answers. Offered short phrases at times as well. Cranial nerves II through XII intact, motor strength is 5/5 in left and 4/5 RIght  deltoid, bicep, tricep, grip, hip flexor, knee extensors, ankle dorsiflexor and plantar flexor Sensory exam normal sensation to light touch and proprioception in left upper and lower extremities, absent on right side  Sensory ataxia  Right finger nose finger and RIght heel shin Musculoskeletal: Full range of motion in all 4 extremities. No joint swelling     Assessment/Plan: 1. Functional deficits which require 3+ hours per day of interdisciplinary  therapy in a comprehensive inpatient rehab setting. Physiatrist is providing close team supervision and 24 hour management of active medical problems listed below. Physiatrist and rehab team continue to assess barriers to discharge/monitor patient progress toward functional and medical goals  Care Tool:  Bathing    Body parts bathed by patient: Right arm, Left arm, Right lower leg, Left lower leg, Chest, Abdomen, Face, Right upper leg, Left upper leg   Body parts bathed by helper: Buttocks, Front perineal area Body parts n/a: Front perineal area, Buttocks (chose to note wash in session - performed eariler - not sure performance)   Bathing assist Assist Level: Set up assist     Upper Body Dressing/Undressing Upper body dressing   What is the patient wearing?: Pull over shirt    Upper body assist Assist Level: Set up assist    Lower Body Dressing/Undressing Lower body dressing      What is the patient wearing?: Pants, Underwear/pull up     Lower body assist Assist for lower body dressing: Supervision/Verbal cueing     Toileting Toileting    Toileting assist Assist for toileting: Minimal Assistance - Patient > 75%     Transfers Chair/bed transfer  Transfers assist     Chair/bed transfer assist level: Supervision/Verbal cueing Chair/bed transfer assistive device:  Ambulation   Ambulation assist   Ambulation activity did not occur: Safety/medical concerns (fatigue, weakness, decreased balance/postural control)  Assist level: Contact Guard/Touching assist Assistive device: Walker-rolling Max distance: 141ft   Walk 10 feet activity   Assist  Walk 10 feet activity did not occur: Safety/medical concerns (fatigue, weakness, decreased balance/postural control)  Assist level: Contact Guard/Touching assist Assistive device: Walker-rolling   Walk 50 feet activity   Assist Walk 50 feet with 2 turns activity did not occur: Safety/medical  concerns (fatigue, weakness, decreased balance/postural control)  Assist level: Contact Guard/Touching assist Assistive device: Walker-rolling    Walk 150 feet activity   Assist Walk 150 feet activity did not occur: Safety/medical concerns (fatigue, weakness, decreased balance/postural control)  Assist level: Contact Guard/Touching assist Assistive device: Walker-rolling    Walk 10 feet on uneven surface  activity   Assist Walk 10 feet on uneven surfaces activity did not occur: Safety/medical concerns (fatigue, weakness, decreased balance/postural control)         Wheelchair     Assist Is the patient using a wheelchair?: Yes Type of Wheelchair: Manual    Wheelchair assist level: Supervision/Verbal cueing Max wheelchair distance: 171ft    Wheelchair 50 feet with 2 turns activity    Assist        Assist Level: Supervision/Verbal cueing   Wheelchair 150 feet activity     Assist      Assist Level: Supervision/Verbal cueing   Blood pressure (!) 130/59, pulse 80, temperature 98.2 F (36.8 C), temperature source Oral, resp. rate 16, height 4\' 6"  (1.372 m), weight 66.1 kg, SpO2 96 %.    Medical Problem List and Plan: 1.  Right side weakness secondary to left lateral thalamic infarct likely small vessel disease             -patient may  shower             -ELOS/Goals: 14-16d  -Continue CIR therapies including PT, OT, and SLP   Appreciate SW updating daughter 2.  Impaired mobility: d/c Lovenox since ambulating >160 feet             -antiplatelet therapy: Continue Aspirin 81 mg daily and Plavix 75 mg day x3 weeks then aspirin alone 3. Right lower extremity pain: continue Tylenol as needed. Right ankle brace ordered.   11/13- denies pain- con't regimen 4. Insomnia: continue  Melatonin 10 mg nightly as needed sleep             -antipsychotic agents: N/A 5. Neuropsych: This patient is capable of making decisions on her own behalf. 6. Skin/Wound  Care: Routine skin checks 7. Fluids/Electrolytes/Nutrition: Routine in and outs with follow-up chemistries 8.  Permissive hypertension.  Patient on losartan/HCTZ 100/25 daily prior to admission.  d/c losartan given soft DBP- keep off all BP meds for now  11/13- BP under reasonable control 9.  Hyperlipidemia.  LDL 198. Continue Lipitor 10.  CKD stage III.  Baseline creatinine 1.52.  Cr reviewed and current better than baseline, monitor as needed.  11. Tachycardia: resolved, d/c coreg 12. Obesity BMI 35.99: change diet to heart healthy/carb modified.  13. Right knee buckling: knee sleeve ordered 14. GERD without esophagitis: change protonix to PRN. 15. Constipation- resolved. Added magnesium gluconate 250mg  at night since still not regular.   16. Right ankle rolling: soft brace ordered  LOS: 20 days A FACE TO FACE EVALUATION WAS PERFORMED  12/13 08/13/2021, 9:41 AM

## 2021-08-14 DIAGNOSIS — I63512 Cerebral infarction due to unspecified occlusion or stenosis of left middle cerebral artery: Secondary | ICD-10-CM | POA: Diagnosis not present

## 2021-08-14 MED ORDER — POLYETHYLENE GLYCOL 3350 17 G PO PACK: 17.0000 g | PACK | Freq: Every day | ORAL | 0 refills | Status: DC | PRN

## 2021-08-14 MED ORDER — ATORVASTATIN CALCIUM 40 MG PO TABS: 40.0000 mg | ORAL_TABLET | Freq: Every day | ORAL | 0 refills | Status: AC

## 2021-08-14 MED ORDER — OMEPRAZOLE 40 MG PO CPDR: 40.0000 mg | DELAYED_RELEASE_CAPSULE | Freq: Every day | ORAL | 0 refills | Status: AC

## 2021-08-14 MED ORDER — MAGNESIUM GLUCONATE 500 MG PO TABS: 250.0000 mg | ORAL_TABLET | Freq: Every day | ORAL | 0 refills | Status: DC

## 2021-08-14 MED ORDER — ACETAMINOPHEN 325 MG PO TABS: 650.0000 mg | ORAL_TABLET | Freq: Four times a day (QID) | ORAL | Status: AC | PRN

## 2021-08-14 NOTE — Progress Notes (Signed)
PROGRESS NOTE   Subjective/Complaints: "Im doing ok" No complaints Stable for d/c tomorrow Messaged April to schedule hospital follow-up  ROS: Patient denies fever, rash, sore throat, blurred vision, nausea, vomiting, diarrhea, cough, shortness of breath or chest pain, joint or back pain, headache, or mood change.   Objective:   No results found. No results for input(s): WBC, HGB, HCT, PLT in the last 72 hours.  No results for input(s): NA, K, CL, CO2, GLUCOSE, BUN, CREATININE, CALCIUM in the last 72 hours.   Intake/Output Summary (Last 24 hours) at 08/14/2021 1011 Last data filed at 08/14/2021 0700 Gross per 24 hour  Intake 840 ml  Output --  Net 840 ml        Physical Exam: Vital Signs Blood pressure (!) 155/69, pulse 82, temperature 97.9 F (36.6 C), resp. rate 16, height 4\' 6"  (1.372 m), weight 66.1 kg, SpO2 97 %.  Gen: no distress, normal appearing HEENT: oral mucosa pink and moist, NCAT Cardio: Reg rate Chest: normal effort, normal rate of breathing Abd: soft, non-distended Ext: no edema Psych: pleasant, normal affect Skin: intact Neurologic: Communicated fairly accurately with yes/no answers. Offered short phrases at times as well. Cranial nerves II through XII intact, motor strength is 5/5 in left and 4/5 RIght  deltoid, bicep, tricep, grip, hip flexor, knee extensors, ankle dorsiflexor and plantar flexor Sensory exam normal sensation to light touch and proprioception in left upper and lower extremities, absent on right side  Sensory ataxia  Right finger nose finger and RIght heel shin Musculoskeletal: Full range of motion in all 4 extremities. No joint swelling     Assessment/Plan: 1. Functional deficits which require 3+ hours per day of interdisciplinary therapy in a comprehensive inpatient rehab setting. Physiatrist is providing close team supervision and 24 hour management of active medical  problems listed below. Physiatrist and rehab team continue to assess barriers to discharge/monitor patient progress toward functional and medical goals  Care Tool:  Bathing    Body parts bathed by patient: Right arm, Left arm, Right lower leg, Left lower leg, Chest, Abdomen, Face, Right upper leg, Left upper leg   Body parts bathed by helper: Buttocks, Front perineal area Body parts n/a: Front perineal area, Buttocks (chose to note wash in session - performed eariler - not sure performance)   Bathing assist Assist Level: Set up assist     Upper Body Dressing/Undressing Upper body dressing   What is the patient wearing?: Pull over shirt    Upper body assist Assist Level: Set up assist    Lower Body Dressing/Undressing Lower body dressing      What is the patient wearing?: Pants, Underwear/pull up     Lower body assist Assist for lower body dressing: Supervision/Verbal cueing     Toileting Toileting    Toileting assist Assist for toileting: Minimal Assistance - Patient > 75%     Transfers Chair/bed transfer  Transfers assist     Chair/bed transfer assist level: Supervision/Verbal cueing Chair/bed transfer assistive device:   Ambulation assist   Ambulation activity did not occur: Safety/medical concerns (fatigue, weakness, decreased balance/postural control)  Assist level: Contact Guard/Touching assist Assistive  device: Walker-rolling Max distance: 120   Walk 10 feet activity   Assist  Walk 10 feet activity did not occur: Safety/medical concerns (fatigue, weakness, decreased balance/postural control)  Assist level: Contact Guard/Touching assist Assistive device: Walker-rolling   Walk 50 feet activity   Assist Walk 50 feet with 2 turns activity did not occur: Safety/medical concerns (fatigue, weakness, decreased balance/postural control)  Assist level: Contact Guard/Touching assist Assistive device: Walker-rolling     Walk 150 feet activity   Assist Walk 150 feet activity did not occur: Safety/medical concerns (fatigue, weakness, decreased balance/postural control)  Assist level: Contact Guard/Touching assist Assistive device: Walker-rolling    Walk 10 feet on uneven surface  activity   Assist Walk 10 feet on uneven surfaces activity did not occur: Safety/medical concerns (fatigue, weakness, decreased balance/postural control)         Wheelchair     Assist Is the patient using a wheelchair?: Yes Type of Wheelchair: Manual    Wheelchair assist level: Supervision/Verbal cueing Max wheelchair distance: 185ft    Wheelchair 50 feet with 2 turns activity    Assist        Assist Level: Supervision/Verbal cueing   Wheelchair 150 feet activity     Assist      Assist Level: Supervision/Verbal cueing   Blood pressure (!) 155/69, pulse 82, temperature 97.9 F (36.6 C), resp. rate 16, height 4\' 6"  (1.372 m), weight 66.1 kg, SpO2 97 %.    Medical Problem List and Plan: 1.  Right side weakness secondary to left lateral thalamic infarct likely small vessel disease             -patient may  shower             -ELOS/Goals: 14-16d  -Continue CIR therapies including PT, OT, and SLP   Appreciate SW updating daughter 2.  Impaired mobility: d/c Lovenox since ambulating >160 feet             -antiplatelet therapy: Continue Aspirin 81 mg daily and Plavix 75 mg day x3 weeks then aspirin alone 3. Right lower extremity pain: continue Tylenol as needed. 4. Insomnia: continue Melatonin 10 mg nightly as needed sleep             -antipsychotic agents: N/A 5. Neuropsych: This patient is capable of making decisions on her own behalf. 6. Skin/Wound Care: Routine skin checks 7. Fluids/Electrolytes/Nutrition: Routine in and outs with follow-up chemistries 8.  Permissive hypertension.  Patient on losartan/HCTZ 100/25 daily prior to admission.  d/c losartan given soft DBP- keep off  all BP meds for now  11/13- BP under reasonable control 9.  Hyperlipidemia.  LDL 198. Continue Lipitor 10.  CKD stage III.  Baseline creatinine 1.52.  Cr reviewed and current better than baseline, monitor as needed.  11. Tachycardia: resolved, d/c coreg 12. Obesity BMI 35.99: change diet to heart healthy/carb modified.  13. Right knee buckling: knee sleeve ordered 14. GERD without esophagitis: change protonix to PRN. 15. Constipation- resolved. Added magnesium gluconate 250mg  at night since still not regular.   16. Right ankle rolling: soft brace ordered  LOS: 21 days A FACE TO FACE EVALUATION WAS PERFORMED  Diane Webb P Diane Webb 08/14/2021, 10:11 AM

## 2021-08-14 NOTE — Progress Notes (Signed)
Inpatient Rehabilitation Discharge Medication Review by a Pharmacist  A complete drug regimen review was completed for this patient to identify any potential clinically significant medication issues.  High Risk Drug Classes Is patient taking? Indication by Medication  Antipsychotic No   Anticoagulant No   Antibiotic No   Opioid No   Antiplatelet Yes ASA s/p CVA  Hypoglycemics/insulin No   Vasoactive Medication No D/c home HTN meds  Chemotherapy No   Other No      Clinically significant medication issues were identified that warrant physician communication and completion of prescribed/recommended actions by midnight of the next day:  No  Time spent performing this drug regimen review (minutes):  5-10 min  Loyal Rudy S. Merilynn Finland, PharmD, BCPS Clinical Staff Pharmacist [email or web address] Pasty Spillers 08/14/2021 12:13 PM

## 2021-08-14 NOTE — Progress Notes (Signed)
Physical Therapy Session Note  Patient Details  Name: Diane Webb MRN: [id number] Date of Birth: 29-Nov-1926  Today's Date: 08/14/2021 PT Individual Time: 8177-1165 PT Individual Time Calculation (min): 68 min   Short Term Goals: Week 1:  PT Short Term Goal 1 (Week 1): pt will transfer sit<>stand with LRAD and min A PT Short Term Goal 1 - Progress (Week 1): Progressing toward goal PT Short Term Goal 2 (Week 1): pt will transfer bed<>chair with LRAD and mod A consistantly PT Short Term Goal 2 - Progress (Week 1): Progressing toward goal PT Short Term Goal 3 (Week 1): pt will perform simulated car transfer with LRAD and mod A PT Short Term Goal 3 - Progress (Week 1): Progressing toward goal Week 2:  PT Short Term Goal 1 (Week 2): pt will transfer sit<>stand with LRAD and min A PT Short Term Goal 1 - Progress (Week 2): Met PT Short Term Goal 2 (Week 2): pt will transfer bed<>chair with LRAD and mod A consistantly PT Short Term Goal 2 - Progress (Week 2): Met PT Short Term Goal 3 (Week 2): pt will ambulate 30f with LRAD and mod A of 1 PT Short Term Goal 3 - Progress (Week 2): Met Week 3:  PT Short Term Goal 1 (Week 3): STG=LTG due to LOS  Skilled Therapeutic Interventions/Progress Updates:    Pt seated in w/c on arrival and agreeable to therapy, ankle brace and shoes already in place. No complaint of pain. Session focused on assessing mobility for d/c. Pt transported to therapy gym for time management and energy conservation. Pt navigated 4" curb x 2 with supervision and VC for RW management. Pt ascended 6" steps x 4 with B handrails and mod A. Pt was unable to descend steps and descended 3" steps x 8 with heavy min A. Pt then stood with RW to pick up pillowcase from the floor with close supervision. Gait with RW and supervision 2 x 150 ft and x75 ft. Pt demoes hunched posture and flexed hips/knees in stance, did not demo unsafe behavior or gait pattern. Pt then performed car transfer with  supervision. Navigated ramp and uneven surface with RW and supervision. Pt then propelled w/c with BLE for improved LE strength. ambulatory transfer to EOB, sit>supine mod I, mod A [facility] was left with all needs in reach and alarm active.   Therapy Documentation Precautions:  Precautions Precautions: Fall Precaution Comments: R hemiparesis Required Braces or Orthoses:  (has R knee sleeve and R ankle brace) Restrictions Weight Bearing Restrictions: No   Therapy/Group: Individual Therapy  OMickel Fuchs11/14/2022, 3:31 PM

## 2021-08-14 NOTE — Progress Notes (Signed)
Occupational Therapy Session Note  Patient Details  Name: Diane Webb MRN: [id number] Date of Birth: [birth year]  Today's Date: 08/14/2021 OT Individual Time: 1305-1402 OT Individual Time Calculation (min): 57 min    Short Term Goals: Week 3:  OT Short Term Goal 1 (Week 3): STG = LTG due to ELOS  Skilled Therapeutic Interventions/Progress Updates:  Skilled OT intervention completed with focus on d/c planning with discussion of POC, rehab goals and readiness for d/c home. Pt received seated in w/c, agreeable to session. Transported to [facility] window hallway in w/c with total A for time. Completed BUE strengthening including the following for endurance: 3 pound dowel-  Bicep flexion x15 1 pound dowel-  Chest presses x15  Forward rows x10  Backwards rows x10  Cues for form and positioning needed. Education provided about using cane or broom for 1 pound weight for dowel-like structure. Pt then able to complete seated toe taps on orange cone x15 on each leg. Graded up with pt sit> stand using RW with CGA, then toe tapping with x10 each leg with CGA-supervision needed for balance during task. Pt demonstrating great improvement in endurance and strength. Discussed importance of doing exercises at home to continue endurance/strength progression at home. Ambulated using RW through 5 cones in obstacle course set up, with pt requiring supervision assist and cues at times during sharp turns to position RLE. Transported in w/c, with total A for time, with pt needing assist to bathroom. Ambulated using RW with supervision to [facility], continent episode- void only. Completed all pericare in standing with supervision assist and was able to donn clothing at same assist. Pt left seated in w/c, with chair alarm on, and all needs in reach at end of session.    Therapy Documentation Precautions:  Precautions Precautions: Fall Precaution Comments: R hemiparesis Required Braces or Orthoses:  (has R knee sleeve and R  ankle brace) Restrictions Weight Bearing Restrictions: No  Pain: No c/o pain   Therapy/Group: Individual Therapy  Navin Dogan E Olen Eaves 08/14/2021, 7:40 AM

## 2021-08-14 NOTE — Progress Notes (Signed)
Occupational Therapy Session Note  Patient Details  Name: Diane Webb MRN: [id number] Date of Birth: Aug 01, 1927  Today's Date: 08/14/2021 OT Individual Time: 1020-1115 OT Individual Time Calculation (min): 55 min    Short Term Goals: Week 3:  OT Short Term Goal 1 (Week 3): STG = LTG due to ELOS  Skilled Therapeutic Interventions/Progress Updates:  Skilled OT intervention completed with focus on ADL retraining. Pt received supine in bed, agreeable to session and requesting to use toilet. Bed mobility with supervision, sit > stand using RW with CGA, then ambulated with CGA to bathroom. Continent episode of void and BM- documented in chart. Pt able to complete all peri care in stance using RW with CGA for balance. Opted for EOB self-care vs shower. Completed UB bathing and dressing with set up A this session. Completed LB bathing with supervision with forward lean (has been given LH sponge for home/shower use), and able to thread BLEs into underwear with pad and pants with forward lean, then donn over hips with CGA in stance using RW. Pt completed grooming tasks with set up A while seated EOB. Pharmacist in room to talk about method of securing meds once d/c. Donned TEDS/shoes/R ankle brace with total A for time, however pt has been able to complete shoes with supervision using AE. Sit > stand, then stand pivot using RW and close supervision to w/c. Pt left seated in w/c, with chair alarm on, and all needs in reach at end of session.   Therapy Documentation Precautions:  Precautions Precautions: Fall Precaution Comments: R hemiparesis Required Braces or Orthoses:  (has R knee sleeve and R ankle brace) Restrictions Weight Bearing Restrictions: No  Pain: No c/o pain  Therapy/Group: Individual Therapy  Faige Seely E Carlia Bomkamp 08/14/2021, 7:42 AM

## 2021-08-15 DIAGNOSIS — I63512 Cerebral infarction due to unspecified occlusion or stenosis of left middle cerebral artery: Secondary | ICD-10-CM | POA: Diagnosis not present

## 2021-08-15 NOTE — Progress Notes (Signed)
PROGRESS NOTE   Subjective/Complaints: D/c home today Patient's chart reviewed- No issues reported overnight Vitals signs stable  She has no complaints this morning  ROS: Patient denies fever, rash, sore throat, blurred vision, nausea, vomiting, diarrhea, cough, shortness of breath or chest pain, joint or back pain, headache, or mood change. +right leg numbness  Objective:   No results found. No results for input(s): WBC, HGB, HCT, PLT in the last 72 hours.  No results for input(s): NA, K, CL, CO2, GLUCOSE, BUN, CREATININE, CALCIUM in the last 72 hours.   Intake/Output Summary (Last 24 hours) at 08/15/2021 0646 Last data filed at 08/14/2021 1842 Gross per 24 hour  Intake 720 ml  Output --  Net 720 ml        Physical Exam: Vital Signs Blood pressure 126/60, pulse 81, temperature 98.8 F (37.1 C), temperature source Oral, resp. rate 15, height 4\' 6"  (1.372 m), weight 71.7 kg, SpO2 99 %.  Gen: no distress, normal appearing HEENT: oral mucosa pink and moist, NCAT Cardio: Reg rate Chest: normal effort, normal rate of breathing Abd: soft, non-distended Ext: no edema Psych: pleasant, normal affect Skin: intact Neurologic: Communicated fairly accurately with yes/no answers. Offered short phrases at times as well. Cranial nerves II through XII intact, motor strength is 5/5 in left and 4/5 RIght  deltoid, bicep, tricep, grip, hip flexor, knee extensors, ankle dorsiflexor and plantar flexor Sensory exam normal sensation to light touch and proprioception in left upper and lower extremities, absent on right side  Sensory ataxia  Right finger nose finger and RIght heel shin Musculoskeletal: Full range of motion in all 4 extremities. No joint swelling     Assessment/Plan: 1. Functional deficits which require 3+ hours per day of interdisciplinary therapy in a comprehensive inpatient rehab setting. Physiatrist is providing  close team supervision and 24 hour management of active medical problems listed below. Physiatrist and rehab team continue to assess barriers to discharge/monitor patient progress toward functional and medical goals  Care Tool:  Bathing    Body parts bathed by patient: Right arm, Left arm, Right lower leg, Left lower leg, Chest, Abdomen, Face, Right upper leg, Left upper leg, Front perineal area, Buttocks   Body parts bathed by helper: Buttocks, Front perineal area Body parts n/a: Front perineal area, Buttocks (chose to note wash in session - performed eariler - not sure performance)   Bathing assist Assist Level: Supervision/Verbal cueing     Upper Body Dressing/Undressing Upper body dressing   What is the patient wearing?: Pull over shirt    Upper body assist Assist Level: Set up assist    Lower Body Dressing/Undressing Lower body dressing      What is the patient wearing?: Pants, Underwear/pull up     Lower body assist Assist for lower body dressing: Supervision/Verbal cueing     Toileting Toileting    Toileting assist Assist for toileting: Supervision/Verbal cueing     Transfers Chair/bed transfer  Transfers assist     Chair/bed transfer assist level: Supervision/Verbal cueing Chair/bed transfer assistive device:   Ambulation assist   Ambulation activity did not occur: Safety/medical concerns (fatigue, weakness, decreased balance/postural  control)  Assist level: Supervision/Verbal cueing Assistive device: Walker-rolling Max distance: 150   Walk 10 feet activity   Assist  Walk 10 feet activity did not occur: Safety/medical concerns (fatigue, weakness, decreased balance/postural control)  Assist level: Supervision/Verbal cueing Assistive device: Walker-rolling   Walk 50 feet activity   Assist Walk 50 feet with 2 turns activity did not occur: Safety/medical concerns (fatigue, weakness, decreased balance/postural  control)  Assist level: Supervision/Verbal cueing Assistive device: Walker-rolling    Walk 150 feet activity   Assist Walk 150 feet activity did not occur: Safety/medical concerns (fatigue, weakness, decreased balance/postural control)  Assist level: Supervision/Verbal cueing Assistive device: Walker-rolling    Walk 10 feet on uneven surface  activity   Assist Walk 10 feet on uneven surfaces activity did not occur: Safety/medical concerns (fatigue, weakness, decreased balance/postural control)   Assist level: Supervision/Verbal cueing Assistive device: Walker-rolling   Wheelchair     Assist Is the patient using a wheelchair?: Yes Type of Wheelchair: Manual    Wheelchair assist level: Supervision/Verbal cueing Max wheelchair distance: 129ft    Wheelchair 50 feet with 2 turns activity    Assist        Assist Level: Supervision/Verbal cueing   Wheelchair 150 feet activity     Assist      Assist Level: Supervision/Verbal cueing   Blood pressure 126/60, pulse 81, temperature 98.8 F (37.1 C), temperature source Oral, resp. rate 15, height 4\' 6"  (1.372 m), weight 71.7 kg, SpO2 99 %.    Medical Problem List and Plan: 1.  Right side weakness secondary to left lateral thalamic infarct likely small vessel disease             -patient may  shower             -ELOS/Goals: 14-16d  -d/c home.  Appreciate SW updating daughter 2.  Impaired mobility: d/c Lovenox since ambulating >160 feet             -antiplatelet therapy: continue Aspirin 81 mg daily and Plavix 75 mg day x3 weeks then aspirin alone 3. Right lower extremity pain: continue Tylenol as needed. 4. Insomnia: continue Melatonin 10 mg nightly as needed sleep             -antipsychotic agents: N/A 5. Neuropsych: This patient is capable of making decisions on her own behalf. 6. Skin/Wound Care: Routine skin checks 7. Fluids/Electrolytes/Nutrition: Routine in and outs with follow-up  chemistries 8.  Permissive hypertension.  Patient on losartan/HCTZ 100/25 daily prior to admission.  d/c losartan given soft DBP- keep off all BP meds for now 9.  Hyperlipidemia.  LDL 198. Continue Lipitor 10.  CKD stage III.  Baseline creatinine 1.52.  Cr reviewed and current better than baseline, monitor as needed.  11. Tachycardia: resolved, d/c coreg 12. Obesity BMI 35.99: change diet to heart healthy/carb modified.  13. Right knee buckling: knee sleeve ordered 14. GERD without esophagitis: change protonix to PRN. 15. Constipation- resolved. Continue magnesium gluconate 250mg  at night since still not regular.   16. Right ankle rolling: soft brace ordered 17. Disposition: HFU scheduled    >30 minutes spent in discharge of patient including review of medications and follow-up appointments, physical examination, and in answering all patient's questions   LOS: 22 days A FACE TO FACE EVALUATION WAS PERFORMED  Rinaldo Cloud Caspar Favila 08/15/2021, 6:46 AM

## 2021-08-15 NOTE — Progress Notes (Signed)
Inpatient Rehabilitation Care Coordinator Discharge Note   Patient Details  Name: Diane Webb MRN: [id number] Date of Birth: [birth year]   Discharge location: Home  Length of Stay: 22 Days  Discharge activity level: Sup  Home/community participation: Daughter Assisting at home  Patient response EH:UDJSHF Literacy - How often do you need to have someone help you when you read instructions, pamphlets, or other written material from your doctor or pharmacy?: Always  Patient response WY:OVZCHY Isolation - How often do you feel lonely or isolated from those around you?: Never  Services provided included: SW, Pharmacy, TR, CM, RN, SLP, OT, PT, RD, MD  Financial Services:  Financial Services Utilized: Medicare    Choices offered to/list presented to: patient daughter  Follow-up services arranged:  Outpatient    Outpatient Servicies: [facility] OP      Patient response to transportation need: Is the patient able to respond to transportation needs?: Yes In the past 12 months, has lack of transportation kept you from medical appointments or from getting medications?: No In the past 12 months, has lack of transportation kept you from meetings, work, or from getting things needed for daily living?: No    Comments (or additional information):  Patient/Family verbalized understanding of follow-up arrangements:  Yes  Individual responsible for coordination of the follow-up plan: Rinaldo Cloud [phone]  Confirmed correct DME delivered: [name] 08/15/2021    [name]

## 2021-08-15 NOTE — Progress Notes (Signed)
Patient is A&O x 4 and able to make her needs known. Patient being D/C day, to home with daughter. Educated patient on S/S of a stroke,continue taking prescribed medications, and follow up with dr appointments. Patient states she has no concerns at this time about going home with her daughter today. VS stable,  personal items within reach. Safety maintained.

## 2021-09-21 ENCOUNTER — Other Ambulatory Visit: Payer: Self-pay

## 2021-09-21 ENCOUNTER — Ambulatory Visit (INDEPENDENT_AMBULATORY_CARE_PROVIDER_SITE_OTHER): Payer: Medicare Other | Admitting: Adult Health

## 2021-09-21 ENCOUNTER — Encounter: Payer: Self-pay | Admitting: Adult Health

## 2021-09-21 VITALS — BP 162/98 | HR 95 | Ht 60.0 in | Wt 156.0 lb

## 2021-09-21 DIAGNOSIS — R29818 Other symptoms and signs involving the nervous system: Secondary | ICD-10-CM | POA: Diagnosis not present

## 2021-09-21 DIAGNOSIS — I6381 Other cerebral infarction due to occlusion or stenosis of small artery: Secondary | ICD-10-CM | POA: Diagnosis not present

## 2021-09-21 DIAGNOSIS — I69351 Hemiplegia and hemiparesis following cerebral infarction affecting right dominant side: Secondary | ICD-10-CM | POA: Diagnosis not present

## 2021-09-21 NOTE — Progress Notes (Signed)
[facility] [address] [place]. [place] [address] [phone]       HOSPITAL FOLLOW UP NOTE  [name] Date of Birth:  [birth year] Medical Record Number:  [id number]   Reason for Referral:  hospital stroke follow up    SUBJECTIVE:   CHIEF COMPLAINT:  Chief Complaint  Patient presents with   Follow-up    RM 2 with daughter Diane Webb Pt is well, just having some numbness on R side.     HPI:   Diane Webb is a 87 year old female with history of hypertension, hyperlipidemia, CKD who presented on 07/19/2021 with right sided weakness and numbness, right facial droop.  CT concerning for left thalamic infarct.  MRI confirmed left thalamic infarct. MRA head and neck showed severe right M2 stenosis, moderate right P2 and mild right M1 stenosis. 2D echo 60-65%. A1c 5.1. LDL 198. Etiology for stroke likely due to small vessel disease given risk factors. Recommended DAPT for 3 weeks then aspirin alone and atorvastatin 40mg  daily. Residual dysarthria, right sided weakness and right sensory impairment. Therapy evals recommend CIR for functional decline.   Today, 09/21/2021, patient being seen for hospital follow up accompanied by her daughter. Overall stable. Reports continued right sided numbness and weakness with gradual improvement since discharge. Working with [facility] PT. Ambulates with RW - was using prior to stroke due to diffuse arthritis. Use of w/c for long distance. No recent falls.  Lives with daughter. Able to maintain ADLs with minimal assistance but daughter does provided supervision.  Denies new stroke/TIA symptoms.  Completed 3 weeks DAPT -remains on aspirin alone as well as atorvastatin without side effects.  Blood pressure today elevated initially at 185/89 and on recheck 162/98 asymptomatic- routinely monitors typically 140s/80s.  No further concerns at this time      PERTINENT IMAGING  Per recent hospitalization Code Stroke CT head hypodensity in  left thalamus representing acute or subacute infarct. Small vessel disease.  ASPECTS 10.  MRI  Acute infarct of the left lateral thalamus MRA  Severe posterior right MCA M2 segment, mild stenosis of right MCA M1 segment, moderate stenosis of right PCA P2 segment, tortuous cervical carotid and vertebral arteries with no evidence of hemodynamically significant stenosis. 2D Echo EF 60 to 65%    ROS:   14 system review of systems performed and negative with exception of weakness, numbness and tingling  PMH:  Past Medical History:  Diagnosis Date   Chronic kidney disease, stage 3b (HCC) 07/20/2021   Essential hypertension 12/01/2015   Mixed hyperlipidemia 12/01/2015    PSH: History reviewed. No pertinent surgical history.  Social History:  Social History   Socioeconomic History   Marital status: Widowed    Spouse name: Not on file   Number of children: Not on file   Years of education: Not on file   Highest education level: Not on file  Occupational History   Not on file  Tobacco Use   Smoking status: Never   Smokeless tobacco: Never  Substance and Sexual Activity   Alcohol use: Not on file   Drug use: Not on file   Sexual activity: Not on file  Other Topics Concern   Not on file  Social History Narrative   Not on file   Social Determinants of Health   Financial Resource Strain: Not on file  Food Insecurity: Not on file  Transportation Needs: Not on file  Physical Activity: Not on file  Stress: Not on file  Social Connections: Not  on file  Intimate Partner Violence: Not on file    Family History:  Family History  Problem Relation Age of Onset   Stroke Neg Hx     Medications:   Current Outpatient Medications on File Prior to Visit  Medication Sig Dispense Refill   acetaminophen (TYLENOL) 325 MG tablet Take 2 tablets (650 mg total) by mouth every 6 (six) hours as needed for mild pain (or Fever >/= 101).     aspirin 81 MG EC tablet Take 1 tablet (81 mg total) by  mouth daily. Swallow whole. 30 tablet 11   atorvastatin (LIPITOR) 40 MG tablet Take 1 tablet (40 mg total) by mouth daily. 30 tablet 0   losartan (COZAAR) 50 MG tablet Take 50 mg by mouth daily.     METAMUCIL FIBER PO Take by mouth.     Multiple Vitamin (MULTI-VITAMIN DAILY PO) Take by mouth.     omeprazole (PRILOSEC) 40 MG capsule Take 1 capsule (40 mg total) by mouth daily. 30 capsule 0   No current facility-administered medications on file prior to visit.    Allergies:   Allergies  Allergen Reactions   Alendronate Diarrhea   Amoxicillin-Pot Clavulanate Diarrhea   Meperidine Other (See Comments)    Syncope   Diclofenac Rash      OBJECTIVE:  Physical Exam  Vitals:   09/21/21 1001 09/21/21 1034  BP: (!) 185/89 (!) 162/98  Pulse: 95   Weight: 156 lb (70.8 kg)   Height: 5' (1.524 m)    Body mass index is 30.47 kg/m. No results found.  Post stroke PHQ 2/9 Depression screen PHQ 2/9 09/21/2021  Decreased Interest 0  Down, Depressed, Hopeless 0  PHQ - 2 Score 0     General: well developed, well nourished, very pleasant elderly African-American female, seated, in no evident distress Head: head normocephalic and atraumatic.   Neck: supple with no carotid or supraclavicular bruits Cardiovascular: regular rate and rhythm, no murmurs Musculoskeletal: no deformity Skin:  no rash/petichiae Vascular:  Normal pulses all extremities   Neurologic Exam Mental Status: Awake and fully alert.  Fluent speech and language.  Oriented to place and time. Recent and remote memory intact. Attention span, concentration and fund of knowledge appropriate. Mood and affect appropriate.  Cranial Nerves: Fundoscopic exam reveals sharp disc margins. Pupils equal, briskly reactive to light. Extraocular movements full without nystagmus. Visual fields full to confrontation. Hearing intact.  Decreased right lower facial sensation.  Mild right lower facial weakness.  Tongue, palate moves normally and  symmetrically.  Motor:  RUE: 4/5 with decreased grip strength RLE: 4/5 proximal and 4-/5 ADF with soft AFO brace LUE:4+/5 deltoid (chronic shoulder arthritis) LLE: 5/5 Sensory.:  Decreased sensory right upper and lower extremity, "odd sensation" with light touch Coordination: Rapid alternating movements normal in all extremities except slightly decreased right hand. Finger-to-nose and heel-to-shin performed accurately bilaterally. Gait and Station: Deferred - RW not present at visit Reflexes: 1+ and symmetric. Toes downgoing.     NIHSS  3 Modified Rankin  3      ASSESSMENT: Diane Webb is a 87 y.o. year old female with recent left lateral thalamic infarct on 07/19/2021 likely secondary to small vessel disease. Vascular risk factors include intracranial stenosis, HTN, HLD and advanced age.      PLAN:  Left thalamic stroke:  Residual deficit: Right hemiparesis and hemisensory impairment.  Continue HH PT. Consider transitioning to outpatient therapies if indicated once completed.  Continued use of rolling walker at all times  unless otherwise instructed Continue aspirin 81 mg daily  and atorvastatin 40 mg daily for secondary stroke prevention.  Discussed secondary stroke prevention measures and importance of close PCP follow up for aggressive stroke risk factor management. I have gone over the pathophysiology of stroke, warning signs and symptoms, risk factors and their management in some detail with instructions to go to the closest emergency room for symptoms of concern. HTN: BP goal <130/90.  Stable on higher end current regimen per PCP HLD: LDL goal <70. Recent LDL 60 down from 198 on atorvastatin 40 mg daily managed by PCP    Follow up in 4 months or call earlier if needed   CC:  [facility] provider: Dr. Leonie Man PCP: Raina Mina., MD    I spent 57 minutes of face-to-face and non-face-to-face time with patient and daughter.  This included previsit chart review including review  of recent hospitalization, lab review, study review, electronic health record documentation, patient and daughter education regarding recent stroke including etiology, secondary stroke prevention measures and importance of managing stroke risk factors, residual deficits and typical recovery time and answered all other questions to patient and daughters satisfaction  Frann Rider, AGNP-BC  [facility] [address] [place], [place] [address]  Phone [phone] Fax [phone] Note: This document was prepared with digital dictation and possible smart phrase technology. Any transcriptional errors that result from this process are unintentional.

## 2021-09-21 NOTE — Patient Instructions (Addendum)
Continue working with home physical therapy - once completed, if additional therapy is needed let me or Dr. Shary Decamp know for further assistance   Continue aspirin 81 mg daily  and atorvastatin 40mg  daily  for secondary stroke prevention  Continue to follow up with PCP regarding cholesterol and blood pressure management  Maintain strict control of hypertension with blood pressure goal below 130/90, diabetes with hemoglobin A1c goal below 7.0 % and cholesterol with LDL cholesterol (bad cholesterol) goal below 70 mg/dL.   Signs of a Stroke? Follow the BEFAST method:  Balance Watch for a sudden loss of balance, trouble with coordination or vertigo Eyes Is there a sudden loss of vision in one or both eyes? Or double vision?  Face: Ask the person to smile. Does one side of the face droop or is it numb?  Arms: Ask the person to raise both arms. Does one arm drift downward? Is there weakness or numbness of a leg? Speech: Ask the person to repeat a simple phrase. Does the speech sound slurred/strange? Is the person confused ? Time: If you observe any of these signs, call 911.     Followup in the future with me in 4 months or call earlier if needed       Thank you for coming to see at [facility]. I hope we have been able to provide you high quality care today.  You may receive a patient satisfaction survey over the next few weeks. We would appreciate your feedback and comments so that we may continue to improve ourselves and the health of our patients.

## 2021-09-21 NOTE — Progress Notes (Signed)
I agree with the above plan 

## 2021-09-29 ENCOUNTER — Other Ambulatory Visit: Payer: Self-pay

## 2021-09-29 ENCOUNTER — Encounter
Payer: Medicare Other | Attending: Physical Medicine and Rehabilitation | Admitting: Physical Medicine and Rehabilitation

## 2021-09-29 ENCOUNTER — Encounter: Payer: Self-pay | Admitting: Physical Medicine and Rehabilitation

## 2021-09-29 VITALS — BP 194/93 | HR 98 | Ht 60.0 in | Wt 155.4 lb

## 2021-09-29 DIAGNOSIS — I10 Essential (primary) hypertension: Secondary | ICD-10-CM | POA: Diagnosis present

## 2021-09-29 DIAGNOSIS — I6381 Other cerebral infarction due to occlusion or stenosis of small artery: Secondary | ICD-10-CM | POA: Diagnosis present

## 2021-09-29 MED ORDER — LOSARTAN POTASSIUM 50 MG PO TABS: 75.0000 mg | ORAL_TABLET | Freq: Every day | ORAL | 3 refills | Status: AC

## 2021-09-29 NOTE — Patient Instructions (Addendum)
HTN: -Advised regarding healthy foods that can help lower blood pressure and provided with a list: 1) citrus foods- high in vitamins and minerals 2) salmon and other fatty fish - reduces inflammation and oxylipins 3) swiss chard (leafy green)- high level of nitrates 4) pumpkin seeds- one of the best natural sources of magnesium 5) Beans and lentils- high in fiber, magnesium, and potassium 6) Berries- high in flavonoids 7) Amaranth (whole grain, can be cooked similarly to rice and oats)- high in magnesium and fiber 8) Pistachios- even more effective at reducing BP than other nuts 9) Carrots- high in phenolic compounds that relax blood vessels and reduce inflammation 10) Celery- contain phthalides that relax tissues of arterial walls 11) Tomatoes- can also improve cholesterol and reduce risk of heart disease 12) Broccoli- good source of magnesium, calcium, and potassium 13) Greek yogurt: high in potassium and calcium 14) Herbs and spices: Celery seed, cilantro, saffron, lemongrass, black cumin, ginseng, cinnamon, cardamom, sweet basil, and ginger 15) Chia and flax seeds- also help to lower cholesterol and blood sugar 16) Beets- high levels of nitrates that relax blood vessels  17) spinach and bananas- high in potassium  -Provided lise of supplements that can help with hypertension:  1) magnesium: one high quality brand is Bioptemizers since it contains all 7 types of magnesium, otherwise over the counter magnesium gluconate 400mg  is a good option 2) B vitamins 3) vitamin D 4) potassium 5) CoQ10 6) L-arginine 7) Vitamin C 8) Beetroot -Educated that goal BP is 120/80. -Made goal to incorporate some of the above foods into diet.    Provided with list of supplements that can help with dyslipidemia: 1) Vitamin B3 500-4,000mg  in divided doses daily (would recommend starting low as can cause uncomfortable facial flushing if started at too high a dose) 2) Phytosterols 2.15 grams daily 3)  Fermented soy 30-50 grams daily 4) EGCG (found in green tea): 500-1000mg  daily 5) Omega-3 fatty acids 3000-5,000mg  daily 6) Flax seed 40 grams daily 7) Monounsaturated fats 20-40 grams daily (olives, olive oil, nuts), also reduces cardiovascular disease 8) Sesame: 40 grams daily 9) Gamma/delta tocotrienols- a family of unsaturated forms of Vitamin E- 200mg  with dinner 10) Pantethine 900mg  daily in divided doses 11) Resveratrol 250mg  daily 12) N Acetyl Cysteine 2000mg  daily in divided doses 13) Curcumin 2000-5000mg  in divided doses daily 14) Pomegranate juice: 8 ounces daily, also helps to lower blood pressure 15) Pomegranate seeds one cup daily, also helps to lower blood pressure 16) Citrus Bergamot 1000mg  daily, also helps with glucose control and weight loss 17) Vitamin C 500mg  daily 18) Quercetin 500-1000mg  daily 19) Glutathione 20) Probiotics 60-100 billion organisms per day 21) Fiber 22) Oats 23) Aged garlic (can eat as food or supplement of 600-900mg  per day) 24) Chia seeds 25 grams per day 25) Lycopene- carotenoid found in high concentrations in tomatoes. 26) Alpha linolenic acid 27) Flavonoids and anthocyanins 28) Wogonin- flavanoid that enhances reverse cholesterol transport 29) Coenzyme Q10 30) Pantethine- derivative of Vitamin B5: 300mg  three times per day or 450mg  twice per day with or without food 31) Barley and other whole grains 32) Orange juice 33) L- carnitine 34) L- Lysine 35) L- Arginine 36) Almonds 37) Morin 38) Rutin 39) Carnosine 40) Histidine  41) Kaempferol  42) Organosulfur compounds 43) Vitamin E 44) Oleic acid 45) RBO (ferulic acid gammaoryzanol) 46) grape seed extract 47) Red wine 48) Berberine HCL 500mg  daily or twice per day- more effective and with fewer adverse effects that ezetimibe monotherapy  49) red yeast rice 2400- 4800 mg/day 50) chlorella 51) Licorice

## 2021-09-29 NOTE — Progress Notes (Signed)
Subjective:    Patient ID: Diane Webb, female    DOB: [birth year], 87 y.o.   MRN: [id number]  HPI Diane Webb is 87 year old woman who presents for follow-up after CVA.  BP 195/101 today- usually runs well at home.  Her blood pressure medication was lower.  Pain is 0/10 4.1 K+ on 10/25. She used to be on a potassium supplement  Numbness and tingling in right arm and leg. Denies pain.  Walked with a cane on Thursday. Therapy going well.    Pain Inventory Average Pain 0 Pain Right Now 0 My pain is tingling  LOCATION OF PAIN  tingling in hand and leg  BOWEL Number of stools per week: 7 Oral laxative use Yes  Type of laxative metamucil   BLADDER Normal  Mobility walk with assistance use a walker how many minutes can you walk? 3-4 ability to climb steps?  yes do you drive?  no needs help with transfers  Function not employed: date last employed . I need assistance with the following:  dressing, bathing, meal prep, household duties, and shopping  Neuro/Psych weakness numbness tingling  Prior Studies Any changes since last visit?  no  Physicians involved in your care Any changes since last visit?  no  has seen PCP about 2 weeks ago   Family History  Problem Relation Age of Onset   Stroke Neg Hx    Social History   Socioeconomic History   Marital status: Widowed    Spouse name: Not on file   Number of children: Not on file   Years of education: Not on file   Highest education level: Not on file  Occupational History   Not on file  Tobacco Use   Smoking status: Never   Smokeless tobacco: Never  Substance and Sexual Activity   Alcohol use: Not on file   Drug use: Not on file   Sexual activity: Not on file  Other Topics Concern   Not on file  Social History Narrative   Not on file   Social Determinants of Health   Financial Resource Strain: Not on file  Food Insecurity: Not on file  Transportation Needs: Not on file  Physical Activity:  Not on file  Stress: Not on file  Social Connections: Not on file   No past surgical history on file. Past Medical History:  Diagnosis Date   Chronic kidney disease, stage 3b (HCC) 07/20/2021   Essential hypertension 12/01/2015   Mixed hyperlipidemia 12/01/2015   BP (!) 195/101 (BP Location: Left Arm, Patient Position: Sitting)    Pulse 98    Ht 5' (1.524 m)    Wt 155 lb 6.4 oz (70.5 kg)    SpO2 94%    BMI 30.35 kg/m   Opioid Risk Score:   Fall Risk Score:  `1  Depression screen PHQ 2/9  Depression screen [facility] 2/9 09/29/2021 09/21/2021  Decreased Interest 0 0  Down, Depressed, Hopeless 0 0  PHQ - 2 Score 0 0  Altered sleeping 0 -  Tired, decreased energy 1 -  Change in appetite 0 -  Feeling bad or failure about yourself  0 -  Trouble concentrating 0 -  Moving slowly or fidgety/restless 0 -  Suicidal thoughts 0 -  PHQ-9 Score 1 -     Review of Systems  Constitutional:  Positive for unexpected weight change.       Wt loss  HENT: Negative.    Eyes: Negative.   Respiratory: Negative.  Cardiovascular: Negative.   Gastrointestinal: Negative.   Endocrine: Negative.   Genitourinary: Negative.   Musculoskeletal:  Positive for gait problem.  Skin: Negative.   Allergic/Immunologic: Negative.   Neurological:  Positive for numbness.       Tingling  Hematological: Negative.   Psychiatric/Behavioral: Negative.    All other systems reviewed and are negative.     Objective:   Physical Exam  Gen: no distress, normal appearing HEENT: oral mucosa pink and moist, NCAT Cardio: Reg rate Chest: normal effort, normal rate of breathing Abd: soft, non-distended Ext: no edema Psych: pleasant, normal affect Skin: intact Neuro: WC bound, decreased sensation throughout right upper and lower extremity    Assessment & Plan:   1) CVA -continue therapies -provided dietary and exercise counseling -discussed that hypertension is number one reversible risk factor for stroke.    2)  HTN: -BP is 190s/100s today on multiple repeats -increase Cozaar to 75mg  daily -Advised checking BP daily at home and logging results to bring into follow-up appointment with her PCP and myself. -Reviewed BP meds today.  -Advised regarding healthy foods that can help lower blood pressure and provided with a list: 1) citrus foods- high in vitamins and minerals 2) salmon and other fatty fish - reduces inflammation and oxylipins 3) swiss chard (leafy green)- high level of nitrates 4) pumpkin seeds- one of the best natural sources of magnesium 5) Beans and lentils- high in fiber, magnesium, and potassium 6) Berries- high in flavonoids 7) Amaranth (whole grain, can be cooked similarly to rice and oats)- high in magnesium and fiber 8) Pistachios- even more effective at reducing BP than other nuts 9) Carrots- high in phenolic compounds that relax blood vessels and reduce inflammation 10) Celery- contain phthalides that relax tissues of arterial walls 11) Tomatoes- can also improve cholesterol and reduce risk of heart disease 12) Broccoli- good source of magnesium, calcium, and potassium 13) Greek yogurt: high in potassium and calcium 14) Herbs and spices: Celery seed, cilantro, saffron, lemongrass, black cumin, ginseng, cinnamon, cardamom, sweet basil, and ginger 15) Chia and flax seeds- also help to lower cholesterol and blood sugar 16) Beets- high levels of nitrates that relax blood vessels  17) spinach and bananas- high in potassium  -Provided lise of supplements that can help with hypertension:  1) magnesium: one high quality brand is Bioptemizers since it contains all 7 types of magnesium, otherwise over the counter magnesium gluconate 400mg  is a good option 2) B vitamins 3) vitamin D 4) potassium 5) CoQ10 6) L-arginine 7) Vitamin C 8) Beetroot -Educated that goal BP is 120/80. -Made goal to incorporate some of the above foods into diet.

## 2022-01-18 ENCOUNTER — Ambulatory Visit: Payer: Medicare Other | Admitting: Adult Health

## 2024-06-06 DIAGNOSIS — R000 Tachycardia, unspecified: Secondary | ICD-10-CM | POA: Diagnosis not present

## 2024-06-07 DIAGNOSIS — I4891 Unspecified atrial fibrillation: Secondary | ICD-10-CM | POA: Diagnosis not present

## 2024-06-09 DIAGNOSIS — I4891 Unspecified atrial fibrillation: Secondary | ICD-10-CM | POA: Diagnosis not present
